# Patient Record
Sex: Male | Born: 1950 | Race: White | Hispanic: No | Marital: Married | Smoking: Never smoker
Health system: Southern US, Community
[De-identification: ages and names within clinical notes are randomized; demographics above are authoritative.]

## PROBLEM LIST (undated history)

## (undated) DIAGNOSIS — I499 Cardiac arrhythmia, unspecified: Secondary | ICD-10-CM

## (undated) DIAGNOSIS — Z6828 Body mass index (BMI) 28.0-28.9, adult: Secondary | ICD-10-CM

## (undated) DIAGNOSIS — R55 Syncope and collapse: Secondary | ICD-10-CM

## (undated) DIAGNOSIS — R079 Chest pain, unspecified: Secondary | ICD-10-CM

## (undated) DIAGNOSIS — I2510 Atherosclerotic heart disease of native coronary artery without angina pectoris: Secondary | ICD-10-CM

## (undated) DIAGNOSIS — K219 Gastro-esophageal reflux disease without esophagitis: Secondary | ICD-10-CM

## (undated) DIAGNOSIS — E785 Hyperlipidemia, unspecified: Secondary | ICD-10-CM

## (undated) DIAGNOSIS — IMO0002 Reserved for concepts with insufficient information to code with codable children: Secondary | ICD-10-CM

## (undated) DIAGNOSIS — D721 Eosinophilia, unspecified: Secondary | ICD-10-CM

## (undated) DIAGNOSIS — I480 Paroxysmal atrial fibrillation: Secondary | ICD-10-CM

## (undated) DIAGNOSIS — M1990 Unspecified osteoarthritis, unspecified site: Secondary | ICD-10-CM

## (undated) DIAGNOSIS — I779 Disorder of arteries and arterioles, unspecified: Secondary | ICD-10-CM

## (undated) DIAGNOSIS — I10 Essential (primary) hypertension: Secondary | ICD-10-CM

## (undated) DIAGNOSIS — L989 Disorder of the skin and subcutaneous tissue, unspecified: Secondary | ICD-10-CM

## (undated) HISTORY — DX: Chest pain, unspecified: R07.9

## (undated) HISTORY — PX: UPPER GI ENDOSCOPY: SHX6162

## (undated) HISTORY — DX: Paroxysmal atrial fibrillation: I48.0

## (undated) HISTORY — DX: Essential (primary) hypertension: I10

## (undated) HISTORY — DX: Syncope and collapse: R55

## (undated) HISTORY — DX: Body mass index (BMI) 28.0-28.9, adult: Z68.28

## (undated) HISTORY — DX: Disorder of the skin and subcutaneous tissue, unspecified: L98.9

## (undated) HISTORY — DX: Gastro-esophageal reflux disease without esophagitis: K21.9

## (undated) HISTORY — DX: Disorder of arteries and arterioles, unspecified: I77.9

## (undated) HISTORY — DX: Unspecified osteoarthritis, unspecified site: M19.90

## (undated) HISTORY — DX: Reserved for concepts with insufficient information to code with codable children: IMO0002

## (undated) HISTORY — DX: Hyperlipidemia, unspecified: E78.5

## (undated) HISTORY — DX: Eosinophilia, unspecified: D72.10

## (undated) HISTORY — PX: COLONOSCOPY: SHX174

---
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
## 1994-06-23 HISTORY — PX: COLOSTOMY: SHX63

## 1995-06-24 HISTORY — PX: COLOSTOMY REVERSAL: SHX5782

## 2003-04-15 LAB — HM COLONOSCOPY

## 2004-09-03 ENCOUNTER — Ambulatory Visit: Payer: Self-pay | Admitting: Gastroenterology

## 2009-04-17 ENCOUNTER — Observation Stay: Payer: Self-pay | Admitting: Internal Medicine

## 2009-04-20 ENCOUNTER — Ambulatory Visit: Payer: Self-pay | Admitting: Internal Medicine

## 2013-04-13 ENCOUNTER — Ambulatory Visit: Payer: Self-pay | Admitting: Internal Medicine

## 2013-04-14 ENCOUNTER — Ambulatory Visit (INDEPENDENT_AMBULATORY_CARE_PROVIDER_SITE_OTHER): Payer: 59 | Admitting: Internal Medicine

## 2013-04-14 ENCOUNTER — Encounter: Payer: Self-pay | Admitting: Internal Medicine

## 2013-04-14 VITALS — BP 148/84 | HR 82 | Temp 98.3°F | Ht 67.25 in | Wt 176.0 lb

## 2013-04-14 DIAGNOSIS — Z0000 Encounter for general adult medical examination without abnormal findings: Secondary | ICD-10-CM

## 2013-04-14 DIAGNOSIS — R030 Elevated blood-pressure reading, without diagnosis of hypertension: Secondary | ICD-10-CM

## 2013-04-14 DIAGNOSIS — IMO0001 Reserved for inherently not codable concepts without codable children: Secondary | ICD-10-CM

## 2013-04-14 DIAGNOSIS — E785 Hyperlipidemia, unspecified: Secondary | ICD-10-CM

## 2013-04-14 NOTE — Assessment & Plan Note (Signed)
Will check lipids and LFTs with labs today. Encouraged continued healthy diet and regular physical activity.

## 2013-04-14 NOTE — Progress Notes (Signed)
  Subjective:    Patient ID: Dalton Sanders, male    DOB: 1950-07-22, 62 y.o.   MRN: [id number]  HPI 62 year old male with history of hyperlipidemia and hypertension presents to establish care. He reports he is generally feeling well. In the past, he had been taking simvastatin. He has not taken this medication in several months. He has been taking red yeast rice and fish oil. He tries to follow a healthy diet low in saturated fat and processed sugars. He is physically active. He denies any concerns today.  No outpatient encounter prescriptions on file as of 04/14/2013.   No facility-administered encounter medications on file as of 04/14/2013.   BP 148/84  Pulse 82  Temp(Src) 98.3 F (36.8 C) (Oral)  Ht 5' 7.25" (1.708 m)  Wt 176 lb (79.833 kg)  BMI 27.37 kg/m2  SpO2 97%  Review of Systems  Constitutional: Negative for fever, chills, activity change, appetite change, fatigue and unexpected weight change.  Eyes: Negative for visual disturbance.  Respiratory: Negative for cough and shortness of breath.   Cardiovascular: Negative for chest pain, palpitations and leg swelling.  Gastrointestinal: Negative for abdominal pain and abdominal distention.  Genitourinary: Negative for dysuria, urgency and difficulty urinating.  Musculoskeletal: Negative for arthralgias and gait problem.  Skin: Negative for color change and rash.  Hematological: Negative for adenopathy.  Psychiatric/Behavioral: Negative for sleep disturbance and dysphoric mood. The patient is not nervous/anxious.        Objective:   Physical Exam  Constitutional: He is oriented to person, place, and time. He appears well-developed and well-nourished. No distress.  HENT:  Head: Normocephalic and atraumatic.  Right Ear: External ear normal.  Left Ear: External ear normal.  Nose: Nose normal.  Mouth/Throat: Oropharynx is clear and moist. No oropharyngeal exudate.  Eyes: Conjunctivae and EOM are normal. Pupils are equal,  round, and reactive to light. Right eye exhibits no discharge. Left eye exhibits no discharge. No scleral icterus.  Neck: Normal range of motion. Neck supple. No tracheal deviation present. No thyromegaly present.  Cardiovascular: Normal rate, regular rhythm and normal heart sounds.  Exam reveals no gallop and no friction rub.   No murmur heard. Pulmonary/Chest: Effort normal and breath sounds normal. No respiratory distress. He has no wheezes. He has no rales. He exhibits no tenderness.  Abdominal: Soft. Bowel sounds are normal. He exhibits no distension. There is no tenderness. There is no rebound and no guarding.  Musculoskeletal: Normal range of motion. He exhibits no edema.  Lymphadenopathy:    He has no cervical adenopathy.  Neurological: He is alert and oriented to person, place, and time. No cranial nerve deficit. Coordination normal.  Skin: Skin is warm and dry. No rash noted. He is not diaphoretic. No erythema. No pallor.  Psychiatric: He has a normal mood and affect. His behavior is normal. Judgment and thought content normal.          Assessment & Plan:

## 2013-04-14 NOTE — Assessment & Plan Note (Signed)
BP Readings from Last 3 Encounters:  04/14/13 148/84   Pt notes mild elevation of BP in the past. Never on meds for this. Will check renal function with labs. Will monitor BP.

## 2013-04-15 ENCOUNTER — Other Ambulatory Visit (INDEPENDENT_AMBULATORY_CARE_PROVIDER_SITE_OTHER): Payer: 59

## 2013-04-15 DIAGNOSIS — Z0000 Encounter for general adult medical examination without abnormal findings: Secondary | ICD-10-CM

## 2013-04-15 DIAGNOSIS — R030 Elevated blood-pressure reading, without diagnosis of hypertension: Secondary | ICD-10-CM

## 2013-04-15 DIAGNOSIS — IMO0001 Reserved for inherently not codable concepts without codable children: Secondary | ICD-10-CM

## 2013-04-15 DIAGNOSIS — E785 Hyperlipidemia, unspecified: Secondary | ICD-10-CM

## 2013-04-15 LAB — CBC WITH DIFFERENTIAL/PLATELET
Basophils Absolute: 0 10*3/uL (ref 0.0–0.1)
Basophils Relative: 0.6 % (ref 0.0–3.0)
Eosinophils Relative: 9.9 % — ABNORMAL HIGH (ref 0.0–5.0)
HCT: 44.3 % (ref 39.0–52.0)
Lymphocytes Relative: 37.1 % (ref 12.0–46.0)
Lymphs Abs: 2.9 10*3/uL (ref 0.7–4.0)
MCHC: 34.1 g/dL (ref 30.0–36.0)
MCV: 94.5 fl (ref 78.0–100.0)
Monocytes Relative: 7 % (ref 3.0–12.0)
Neutrophils Relative %: 45.4 % (ref 43.0–77.0)
Platelets: 246 10*3/uL (ref 150.0–400.0)
RBC: 4.69 Mil/uL (ref 4.22–5.81)
WBC: 7.8 10*3/uL (ref 4.5–10.5)

## 2013-04-15 LAB — LIPID PANEL
HDL: 49.6 mg/dL (ref >39.00)
Total CHOL/HDL Ratio: 5
Triglycerides: 150 mg/dL — ABNORMAL HIGH (ref 0.0–149.0)
VLDL: 30 mg/dL (ref 0.0–40.0)

## 2013-04-15 LAB — COMPREHENSIVE METABOLIC PANEL
ALT: 21 U/L (ref 0–53)
AST: 22 U/L (ref 0–37)
Albumin: 3.9 g/dL (ref 3.5–5.2)
Alkaline Phosphatase: 58 U/L (ref 39–117)
BUN: 13 mg/dL (ref 6–23)
Glucose, Bld: 104 mg/dL — ABNORMAL HIGH (ref 70–99)
Potassium: 4.2 mEq/L (ref 3.5–5.1)

## 2013-04-15 LAB — TSH: TSH: 1.25 u[IU]/mL (ref 0.35–5.50)

## 2013-04-15 LAB — MICROALBUMIN / CREATININE URINE RATIO
Creatinine,U: 284.5 mg/dL
Microalb Creat Ratio: 0.5 mg/g (ref 0.0–30.0)
Microalb, Ur: 1.3 mg/dL (ref 0.0–1.9)

## 2013-04-15 LAB — LDL CHOLESTEROL, DIRECT: Direct LDL: 200.1 mg/dL

## 2013-04-16 LAB — PSA, TOTAL AND FREE
PSA, Free Pct: 22 % — ABNORMAL LOW (ref >25)
PSA, Free: 0.53 ng/mL
PSA: 2.38 ng/mL (ref <=4.00)

## 2013-04-16 LAB — VITAMIN D 25 HYDROXY (VIT D DEFICIENCY, FRACTURES): Vit D, 25-Hydroxy: 70 ng/mL (ref 30–89)

## 2013-04-18 ENCOUNTER — Telehealth: Payer: Self-pay | Admitting: *Deleted

## 2013-04-18 MED ORDER — ATORVASTATIN CALCIUM 20 MG PO TABS: 20.0000 mg | ORAL_TABLET | Freq: Every day | ORAL | Status: DC

## 2013-04-18 NOTE — Telephone Encounter (Signed)
Rx sent to the pharmacy.

## 2013-05-02 ENCOUNTER — Encounter: Payer: Self-pay | Admitting: Internal Medicine

## 2013-05-05 ENCOUNTER — Ambulatory Visit: Payer: 59 | Admitting: Internal Medicine

## 2013-05-13 ENCOUNTER — Encounter: Payer: Self-pay | Admitting: Internal Medicine

## 2013-06-08 ENCOUNTER — Telehealth: Payer: Self-pay | Admitting: Internal Medicine

## 2013-06-08 NOTE — Telephone Encounter (Signed)
The patient would like a call back from the Dr. When she comes back to the office.

## 2013-06-08 NOTE — Telephone Encounter (Signed)
Can we try to work him in at end of day on Thursday?

## 2013-06-08 NOTE — Telephone Encounter (Signed)
Patient Information:  Caller Name: Malick  Phone: [phone]  Patient: Dalton Sanders, Dalton Sanders  Gender: Male  DOB: 1950-12-09  Age: 62 Years  PCP: Ronna Polio (Adults only)  Office Follow Up:  Does the office need to follow up with this patient?: No  Instructions For The Office: N/A  RN Note:  Stressful at work. Not on BP meds. Used Mucinex D recently.  Checks BP at least TID. Felt flushed after taking higher dose of Niacin. No appointments for 12/17 or 06/09/13.  Transferred to [employer] office scheduler for future appointment.  Symptoms  Reason For Call & Symptoms: BP 170/84 both arms at 1200;  180/87 and  now 140/77 at 1430.  Reviewed Health History In EMR: Yes  Reviewed Medications In EMR: Yes  Reviewed Allergies In EMR: Yes  Reviewed Surgeries / Procedures: Yes  Date of Onset of Symptoms: 06/08/2013  Treatments Tried: took 2 baby ASA sub lig  Treatments Tried Worked: Yes  Guideline(s) Used:  High Blood Pressure  Disposition Per Guideline:   See Within 2 Weeks in Office  Reason For Disposition Reached:   BP > 160/100  Advice Given:  General:  Untreated high blood pressure may cause damage to the heart, brain, kidneys, and eyes.  Treatment of high blood pressure can reduce the risk of stroke, heart attack, and heart failure.  The goal of blood pressure treatment for most patients with hypertension is to keep the blood pressure under 140/90.  Call Back If:  Headache, blurred vision, difficulty talking, or difficulty walking occurs  Chest pain or difficulty breathing occurs  You want to go in to the office for a blood pressure check  You become worse.  Patient Will Follow Care Advice:  YES

## 2013-06-09 ENCOUNTER — Ambulatory Visit (INDEPENDENT_AMBULATORY_CARE_PROVIDER_SITE_OTHER): Payer: 59 | Admitting: Internal Medicine

## 2013-06-09 ENCOUNTER — Encounter (INDEPENDENT_AMBULATORY_CARE_PROVIDER_SITE_OTHER): Payer: Self-pay

## 2013-06-09 VITALS — BP 150/92 | HR 80 | Temp 98.0°F | Wt 180.0 lb

## 2013-06-09 DIAGNOSIS — R079 Chest pain, unspecified: Secondary | ICD-10-CM

## 2013-06-09 DIAGNOSIS — E785 Hyperlipidemia, unspecified: Secondary | ICD-10-CM

## 2013-06-09 DIAGNOSIS — R030 Elevated blood-pressure reading, without diagnosis of hypertension: Secondary | ICD-10-CM

## 2013-06-09 LAB — TROPONIN I: Troponin I: 0.02 ng/mL (ref <0.06)

## 2013-06-09 NOTE — Telephone Encounter (Signed)
Pt notified. Will be seen today at 3:30

## 2013-06-09 NOTE — Progress Notes (Signed)
Pre-visit discussion using our clinic review tool. No additional management support is needed unless otherwise documented below in the visit note.  

## 2013-06-10 ENCOUNTER — Encounter: Payer: Self-pay | Admitting: Internal Medicine

## 2013-06-10 DIAGNOSIS — R079 Chest pain, unspecified: Secondary | ICD-10-CM | POA: Insufficient documentation

## 2013-06-10 LAB — COMPREHENSIVE METABOLIC PANEL
ALT: 31 U/L (ref 0–53)
AST: 27 U/L (ref 0–37)
Albumin: 4.4 g/dL (ref 3.5–5.2)
BUN: 18 mg/dL (ref 6–23)
CO2: 30 mEq/L (ref 19–32)
Calcium: 9.3 mg/dL (ref 8.4–10.5)
Chloride: 103 mEq/L (ref 96–112)
Potassium: 4.3 mEq/L (ref 3.5–5.1)

## 2013-06-10 LAB — LDL CHOLESTEROL, DIRECT: Direct LDL: 97.2 mg/dL

## 2013-06-10 LAB — LIPID PANEL
Cholesterol: 178 mg/dL (ref 0–200)
HDL: 60.9 mg/dL (ref >39.00)

## 2013-06-10 NOTE — Progress Notes (Signed)
Subjective:    Patient ID: Dalton Sanders, male    DOB: [birth year], 62 y.o.   MRN: [id number]  HPI 62 year old male with history of hyperlipidemia presents for acute visit. Over the last few weeks, he reports that his blood pressure has been more elevated. This is particularly true when he is feeling anxious. During those times, blood pressure can be as high as 160-180/100. At baseline, his blood pressure is typically near 130/70 or less. He monitors his blood pressure several times daily. Over the last few days, he has noted increased fatigue. He also notes intermittent symptoms of epigastric burning or lower chest discomfort. One of his friends was recently diagnosed with coronary artery disease at age 27. He reports that this has increased his anxiety about having coronary artery disease himself. He is physically very active. He tries to follow a healthy diet. He was recently started on atorvastatin to help control lipids.  Outpatient Encounter Prescriptions as of 06/09/2013  Medication Sig  . aspirin 81 MG tablet Take 81 mg by mouth daily.  Marland Kitchen atorvastatin (LIPITOR) 20 MG tablet Take 1 tablet (20 mg total) by mouth daily.  . Cetirizine HCl 10 MG CAPS Take by mouth.  . Cholecalciferol (VITAMIN D3) 2000 UNITS TABS Take by mouth.  . Coenzyme Q10 200 MG capsule Take 200 mg by mouth daily.  Boris Lown Oil 300 MG CAPS Take by mouth.  Marland Kitchen L-Arginine 1000 MG TABS Take by mouth.  . Multiple Vitamin (ONE-A-DAY MENS PO) Take by mouth.  . niacin 500 MG tablet Take 500 mg by mouth at bedtime.  Marland Kitchen omeprazole (PRILOSEC) 20 MG capsule Take 20 mg by mouth daily.  . Red Yeast Rice Extract (RED YEAST RICE PO) Take 1,200 mg by mouth.  . Saw Palmetto, Serenoa repens, (SAW PALMETTO PO) Take 40 mg by mouth.  . vitamin C (ASCORBIC ACID) 500 MG tablet Take 500 mg by mouth daily.   BP 150/92  Pulse 80  Temp(Src) 98 F (36.7 C) (Oral)  Wt 180 lb (81.647 kg)  SpO2 97%  Review of Systems  Constitutional:  Positive for fatigue. Negative for fever, chills, activity change, appetite change and unexpected weight change.  Eyes: Negative for visual disturbance.  Respiratory: Negative for cough and shortness of breath.   Cardiovascular: Positive for chest pain (upper epigastric, lower chest). Negative for palpitations and leg swelling.  Gastrointestinal: Negative for abdominal pain and abdominal distention.  Genitourinary: Negative for dysuria, urgency and difficulty urinating.  Musculoskeletal: Negative for arthralgias and gait problem.  Skin: Negative for color change and rash.  Hematological: Negative for adenopathy.  Psychiatric/Behavioral: Negative for sleep disturbance and dysphoric mood. The patient is not nervous/anxious.        Objective:   Physical Exam  Constitutional: He is oriented to person, place, and time. He appears well-developed and well-nourished. No distress.  HENT:  Head: Normocephalic and atraumatic.  Right Ear: External ear normal.  Left Ear: External ear normal.  Nose: Nose normal.  Mouth/Throat: Oropharynx is clear and moist. No oropharyngeal exudate.  Eyes: Conjunctivae and EOM are normal. Pupils are equal, round, and reactive to light. Right eye exhibits no discharge. Left eye exhibits no discharge. No scleral icterus.  Neck: Normal range of motion. Neck supple. No tracheal deviation present. No thyromegaly present.  Cardiovascular: Normal rate, regular rhythm and normal heart sounds.  Exam reveals no gallop and no friction rub.   No murmur heard. Pulmonary/Chest: Effort normal and breath sounds normal. No respiratory distress. He  has no wheezes. He has no rales. He exhibits no tenderness.  Musculoskeletal: Normal range of motion. He exhibits no edema.  Lymphadenopathy:    He has no cervical adenopathy.  Neurological: He is alert and oriented to person, place, and time. No cranial nerve deficit. Coordination normal.  Skin: Skin is warm and dry. No rash noted. He is  not diaphoretic. No erythema. No pallor.  Psychiatric: He has a normal mood and affect. His behavior is normal. Judgment and thought content normal.          Assessment & Plan:

## 2013-06-10 NOTE — Assessment & Plan Note (Signed)
Blood pressure has been intermittently elevated in setting of increased anxiety. However, he also has some low blood pressure readings at home. Will continue to monitor for now. Consider adding antihypertensive agent if BP consistently >140/90. Follow up 4 weeks for recheck.

## 2013-06-10 NOTE — Assessment & Plan Note (Signed)
Lipids are much improved on atorvastatin. Will continue.

## 2013-06-10 NOTE — Assessment & Plan Note (Signed)
Patient with hyperlipidemia with recent episodes of epigastric or lower chest discomfort and increased fatigue. Question if this may be secondary to coronary artery disease. EKG normal today. Will set up cardiology evaluation for possible stress test. Continue atorvastatin.

## 2013-06-14 ENCOUNTER — Encounter: Payer: Self-pay | Admitting: Cardiovascular Disease

## 2013-06-14 ENCOUNTER — Encounter (INDEPENDENT_AMBULATORY_CARE_PROVIDER_SITE_OTHER): Payer: Self-pay

## 2013-06-14 ENCOUNTER — Encounter: Payer: Self-pay | Admitting: *Deleted

## 2013-06-14 ENCOUNTER — Ambulatory Visit (INDEPENDENT_AMBULATORY_CARE_PROVIDER_SITE_OTHER): Payer: 59 | Admitting: Cardiovascular Disease

## 2013-06-14 VITALS — BP 152/101 | HR 82 | Ht 68.0 in | Wt 175.0 lb

## 2013-06-14 VITALS — BP 158/101 | HR 99 | Ht 68.0 in | Wt 175.8 lb

## 2013-06-14 DIAGNOSIS — R079 Chest pain, unspecified: Secondary | ICD-10-CM

## 2013-06-14 DIAGNOSIS — R030 Elevated blood-pressure reading, without diagnosis of hypertension: Secondary | ICD-10-CM

## 2013-06-14 DIAGNOSIS — E785 Hyperlipidemia, unspecified: Secondary | ICD-10-CM

## 2013-06-14 DIAGNOSIS — IMO0001 Reserved for inherently not codable concepts without codable children: Secondary | ICD-10-CM
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
## 2013-06-14 NOTE — Assessment & Plan Note (Signed)
He is currently being treated with atorvastatin, fish oil and red yeast rice.

## 2013-06-14 NOTE — Patient Instructions (Signed)
Your physician has requested that you have an exercise tolerance test. For further information please visit [email or web address]. Please also follow instruction sheet, as given.   

## 2013-06-14 NOTE — Procedures (Signed)
   Treadmill Stress test  Indication: Atypical chest pain.  Baseline Data:  Resting EKG shows NSR with rate of 78 beats per minute. bpm, no significant ST or T wave changes. Resting blood pressure of 152/100 mm Hg Stand bruce protocal was used.  Exercise Data:  Patient exercised for 6 min 7 sec,  Peak heart rate of 145 bpm.  This was 90 % of the maximum predicted heart rate. No symptoms of chest pain or lightheadedness were reported at peak stress or in recovery.  Peak Blood pressure recorded was 210/108 Maximal work level: 7 METs.  Heart rate at 3 minutes in recovery was 112 bpm. BP response: Hypertensive HR response: Normal  EKG with Exercise: Sinus tachycardia with no significant ST changes  FINAL IMPRESSION: Normal exercise stress test. No significant EKG changes concerning for ischemia. Average exercise tolerance with hypertensive response to exercise.  Recommendation: Continue aggressive treatment of risk factors.

## 2013-06-14 NOTE — Assessment & Plan Note (Signed)
He has been taking his blood pressure at home. The readings are normal. Continue to monitor.

## 2013-06-14 NOTE — Patient Instructions (Signed)
Your stress test is normal.  Follow up as needed.  

## 2013-06-14 NOTE — Assessment & Plan Note (Signed)
Chest pain is overall atypical. Cardiac exam is unremarkable and baseline EKG is normal. I proceeded with a treadmill stress test. He was able to exercise for 6 minutes and 7 seconds. He did not have any chest discomfort and there were no ischemic EKG changes. He did have a hypertensive response to exercise. No further cardiac workup is recommended. I discussed with the patient the importance of lifestyle changes in order to decrease the chance of future coronary artery disease and cardiovascular events. We discussed the importance of controlling risk factors, healthy diet as well as regular exercise. I also explained to him that a normal stress test does not rule out atherosclerosis.

## 2013-06-15 ENCOUNTER — Encounter: Payer: Self-pay | Admitting: *Deleted

## 2013-06-24 ENCOUNTER — Encounter: Payer: Self-pay | Admitting: Internal Medicine

## 2013-06-24 ENCOUNTER — Ambulatory Visit (INDEPENDENT_AMBULATORY_CARE_PROVIDER_SITE_OTHER): Payer: 59 | Admitting: Internal Medicine

## 2013-06-24 VITALS — BP 132/88 | HR 70 | Temp 97.6°F | Wt 180.0 lb

## 2013-06-24 DIAGNOSIS — IMO0001 Reserved for inherently not codable concepts without codable children: Secondary | ICD-10-CM

## 2013-06-24 DIAGNOSIS — E785 Hyperlipidemia, unspecified: Secondary | ICD-10-CM

## 2013-06-24 DIAGNOSIS — R079 Chest pain, unspecified: Secondary | ICD-10-CM

## 2013-06-24 DIAGNOSIS — R030 Elevated blood-pressure reading, without diagnosis of hypertension: Secondary | ICD-10-CM

## 2013-06-24 NOTE — Assessment & Plan Note (Signed)
Lipids markedly improved on atorvastatin. Will continue.

## 2013-06-24 NOTE — Progress Notes (Signed)
Subjective:    Patient ID: [name], male    DOB: [birth year], 63 y.o.   MRN: [id number]  HPI 63 year old male with history of hyperlipidemia and occasional elevation of blood pressure presents for followup. He was recently seen by cardiology for chest pain. He underwent exercise stress test which was normal. He reports that chest pain, described as tingling in his anterior substernal area has resolved. He is generally been feeling well. He denies palpitations or shortness of breath. He has been checking his blood pressure at home and is mostly between 120 to 130s over 70s to 80s.  In regards to hyperlipidemia, we recently started him on atorvastatin. LDL cholesterol initially was 200. Repeat lipids performed this month showed improvement in LDL to 97. He also follows a healthy diet and takes Krill oil. No side effects noted from the Atorvastatin.  Outpatient Encounter Prescriptions as of 06/24/2013  Medication Sig  . aspirin 81 MG tablet Take 81 mg by mouth 2 (two) times daily.   Marland Kitchen atorvastatin (LIPITOR) 20 MG tablet Take 1 tablet (20 mg total) by mouth daily.  . Cetirizine HCl 10 MG CAPS Take by mouth daily.   . Cholecalciferol (VITAMIN D3) 2000 UNITS TABS Take by mouth daily.   . Coenzyme Q10 200 MG capsule Take 200 mg by mouth daily.  Javier Docker Oil 300 MG CAPS Take by mouth daily.   Marland Kitchen L-Arginine 1000 MG TABS Take by mouth daily.   . Multiple Vitamin (ONE-A-DAY MENS PO) Take by mouth daily.   . niacin 500 MG tablet Take 500 mg by mouth at bedtime.  Marland Kitchen omeprazole (PRILOSEC) 20 MG capsule Take 20 mg by mouth daily.  . Red Yeast Rice Extract (RED YEAST RICE PO) Take 1,200 mg by mouth.  . Saw Palmetto, Serenoa repens, (SAW PALMETTO PO) Take 40 mg by mouth.  . vitamin C (ASCORBIC ACID) 500 MG tablet Take 500 mg by mouth daily.   BP 132/88  Pulse 70  Temp(Src) 97.6 F (36.4 C) (Oral)  Wt 180 lb (81.647 kg)  SpO2 97%  Review of Systems  Constitutional: Negative for fever, chills,  activity change, appetite change, fatigue and unexpected weight change.  Eyes: Negative for visual disturbance.  Respiratory: Negative for cough and shortness of breath.   Cardiovascular: Negative for chest pain, palpitations and leg swelling.  Gastrointestinal: Negative for abdominal pain and abdominal distention.  Genitourinary: Negative for dysuria, urgency and difficulty urinating.  Musculoskeletal: Negative for arthralgias and gait problem.  Skin: Negative for color change and rash.  Hematological: Negative for adenopathy.  Psychiatric/Behavioral: Negative for sleep disturbance and dysphoric mood. The patient is not nervous/anxious.        Objective:   Physical Exam  Constitutional: He is oriented to person, place, and time. He appears well-developed and well-nourished. No distress.  HENT:  Head: Normocephalic and atraumatic.  Right Ear: External ear normal.  Left Ear: External ear normal.  Nose: Nose normal.  Mouth/Throat: Oropharynx is clear and moist. No oropharyngeal exudate.  Eyes: Conjunctivae and EOM are normal. Pupils are equal, round, and reactive to light. Right eye exhibits no discharge. Left eye exhibits no discharge. No scleral icterus.  Neck: Normal range of motion. Neck supple. No tracheal deviation present. No thyromegaly present.  Cardiovascular: Normal rate, regular rhythm and normal heart sounds.  Exam reveals no gallop and no friction rub.   No murmur heard. Pulmonary/Chest: Effort normal and breath sounds normal. No respiratory distress. He has no wheezes. He has no  rales. He exhibits no tenderness.  Musculoskeletal: Normal range of motion. He exhibits no edema.  Lymphadenopathy:    He has no cervical adenopathy.  Neurological: He is alert and oriented to person, place, and time. No cranial nerve deficit. Coordination normal.  Skin: Skin is warm and dry. No rash noted. He is not diaphoretic. No erythema. No pallor.  Psychiatric: He has a normal mood and  affect. His behavior is normal. Judgment and thought content normal.          Assessment & Plan:

## 2013-06-24 NOTE — Assessment & Plan Note (Signed)
Symptoms have resolved. Exercise stress test was normal. Pain was likely non-cardiac. He will call if any recurrent symptoms.

## 2013-06-24 NOTE — Progress Notes (Signed)
Pre-visit discussion using our clinic review tool. No additional management support is needed unless otherwise documented below in the visit note.  

## 2013-06-24 NOTE — Assessment & Plan Note (Signed)
BP Readings from Last 3 Encounters:  06/24/13 132/88  06/14/13 152/101  06/14/13 158/101   BP improved in clinic today. Well controlled at home. Encouraged him to continue to monitor BP and call if consistently >140/90

## 2013-10-22 ENCOUNTER — Other Ambulatory Visit: Payer: Self-pay | Admitting: Internal Medicine

## 2013-11-01 ENCOUNTER — Other Ambulatory Visit: Payer: Self-pay | Admitting: Internal Medicine

## 2013-12-29 ENCOUNTER — Encounter: Payer: 59 | Admitting: Internal Medicine

## 2014-01-17 ENCOUNTER — Telehealth: Payer: Self-pay | Admitting: *Deleted

## 2014-01-17 ENCOUNTER — Emergency Department (HOSPITAL_COMMUNITY)
Admission: EM | Admit: 2014-01-17 | Discharge: 2014-01-18 | Disposition: A | Discharge status: Home / Self Care | Payer: 59 | Attending: Emergency Medicine | Admitting: Emergency Medicine

## 2014-01-17 ENCOUNTER — Emergency Department (HOSPITAL_COMMUNITY): Payer: 59

## 2014-01-17 ENCOUNTER — Encounter (HOSPITAL_COMMUNITY): Payer: Self-pay | Admitting: Emergency Medicine

## 2014-01-17 DIAGNOSIS — R002 Palpitations: Secondary | ICD-10-CM | POA: Diagnosis not present

## 2014-01-17 DIAGNOSIS — Z7982 Long term (current) use of aspirin: Secondary | ICD-10-CM | POA: Insufficient documentation

## 2014-01-17 DIAGNOSIS — M1710 Unilateral primary osteoarthritis, unspecified knee: Secondary | ICD-10-CM | POA: Diagnosis not present

## 2014-01-17 DIAGNOSIS — E785 Hyperlipidemia, unspecified: Secondary | ICD-10-CM | POA: Insufficient documentation

## 2014-01-17 DIAGNOSIS — I10 Essential (primary) hypertension: Secondary | ICD-10-CM | POA: Diagnosis not present

## 2014-01-17 DIAGNOSIS — Z79899 Other long term (current) drug therapy: Secondary | ICD-10-CM | POA: Insufficient documentation

## 2014-01-17 DIAGNOSIS — IMO0002 Reserved for concepts with insufficient information to code with codable children: Secondary | ICD-10-CM

## 2014-01-17 DIAGNOSIS — R000 Tachycardia, unspecified: Secondary | ICD-10-CM | POA: Insufficient documentation

## 2014-01-17 LAB — URINALYSIS, ROUTINE W REFLEX MICROSCOPIC
Bilirubin Urine: NEGATIVE
Glucose, UA: NEGATIVE mg/dL
KETONES UR: NEGATIVE mg/dL
Leukocytes, UA: NEGATIVE
NITRITE: NEGATIVE
PH: 8 (ref 5.0–8.0)
Protein, ur: NEGATIVE mg/dL
Specific Gravity, Urine: 1.012 (ref 1.005–1.030)
UROBILINOGEN UA: 1 mg/dL (ref 0.0–1.0)

## 2014-01-17 LAB — CBC
HCT: 42.9 % (ref 39.0–52.0)
Hemoglobin: 14.8 g/dL (ref 13.0–17.0)
MCH: 32.2 pg (ref 26.0–34.0)
MCHC: 34.5 g/dL (ref 30.0–36.0)
MCV: 93.3 fL (ref 78.0–100.0)
Platelets: 247 10*3/uL (ref 150–400)
RBC: 4.6 MIL/uL (ref 4.22–5.81)
RDW: 13.3 % (ref 11.5–15.5)
WBC: 10.1 10*3/uL (ref 4.0–10.5)

## 2014-01-17 LAB — BASIC METABOLIC PANEL
Anion gap: 14 (ref 5–15)
BUN: 15 mg/dL (ref 6–23)
CALCIUM: 9.1 mg/dL (ref 8.4–10.5)
CHLORIDE: 102 meq/L (ref 96–112)
CO2: 26 mEq/L (ref 19–32)
CREATININE: 1.03 mg/dL (ref 0.50–1.35)
GFR calc non Af Amer: 76 mL/min — ABNORMAL LOW (ref >90)
GFR, EST AFRICAN AMERICAN: 88 mL/min — AB (ref >90)
Glucose, Bld: 108 mg/dL — ABNORMAL HIGH (ref 70–99)
Potassium: 3.9 mEq/L (ref 3.7–5.3)
Sodium: 142 mEq/L (ref 137–147)

## 2014-01-17 LAB — URINE MICROSCOPIC-ADD ON

## 2014-01-17 LAB — TROPONIN I

## 2014-01-17 NOTE — ED Notes (Signed)
Pt in xray

## 2014-01-17 NOTE — ED Notes (Signed)
Patient presents stating he noticed his heart was racing.  Stated he was walking out in the heat not drinking.  States he has not been having pain in his chest but has discomfort and a squeezing process
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
## 2014-01-17 NOTE — Telephone Encounter (Signed)
Patient called having bp issues.  Out in hot weather on Sunday 157/141 r 147  And out riding motorcycles this morning and bp 152/119 r 132. He is feeling funny and flutters chest.

## 2014-01-17 NOTE — Telephone Encounter (Signed)
Spoke w/ pt.  He states that he checks his BP about 2-3 times an hour and experiences quite a bit of anxiety.  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 States that his BP has been running 150/130s w/ HR in the 130-160 range.  Denies any pain, but reports a sensation of "butterflies in my chest". Advised pt that he does not have any BP meds to adjust, so he will need an appt. He states that he would like a med that he does not have to take all the time, only prn. He is sched to see Dalton Bayley, NP tomorrow at 1:15.

## 2014-01-18 ENCOUNTER — Ambulatory Visit (INDEPENDENT_AMBULATORY_CARE_PROVIDER_SITE_OTHER): Payer: 59 | Admitting: Nurse Practitioner

## 2014-01-18 ENCOUNTER — Other Ambulatory Visit: Payer: Self-pay | Admitting: Nurse Practitioner

## 2014-01-18 ENCOUNTER — Encounter: Payer: Self-pay | Admitting: Nurse Practitioner

## 2014-01-18 ENCOUNTER — Telehealth: Payer: Self-pay | Admitting: *Deleted

## 2014-01-18 VITALS — BP 140/92 | HR 79 | Ht 69.0 in | Wt 175.5 lb

## 2014-01-18 DIAGNOSIS — R0789 Other chest pain: Secondary | ICD-10-CM

## 2014-01-18 DIAGNOSIS — I479 Paroxysmal tachycardia, unspecified: Secondary | ICD-10-CM

## 2014-01-18 DIAGNOSIS — I480 Paroxysmal atrial fibrillation: Secondary | ICD-10-CM

## 2014-01-18 DIAGNOSIS — I471 Supraventricular tachycardia: Secondary | ICD-10-CM | POA: Insufficient documentation

## 2014-01-18 DIAGNOSIS — R000 Tachycardia, unspecified: Secondary | ICD-10-CM

## 2014-01-18 DIAGNOSIS — R072 Precordial pain: Secondary | ICD-10-CM

## 2014-01-18 DIAGNOSIS — I498 Other specified cardiac arrhythmias: Secondary | ICD-10-CM

## 2014-01-18 LAB — I-STAT TROPONIN, ED: Troponin i, poc: 0 ng/mL (ref 0.00–0.08)

## 2014-01-18 MED ORDER — DILTIAZEM HCL 30 MG PO TABS: 30.0000 mg | ORAL_TABLET | Freq: Four times a day (QID) | ORAL | Status: DC | PRN
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
## 2014-01-18 NOTE — Telephone Encounter (Signed)
error 

## 2014-01-18 NOTE — Telephone Encounter (Signed)
Patient stated he had started to feel worse yesterday afternoon  He stated that his blood pressure felt elevated and his heart was racing  He went to the fire dept for an EKG and to the ED at [facility]  He wanted to know if he should keep his follow up with Christell Faith PA today  I told him to keep the appt  He verbalized understanding

## 2014-01-18 NOTE — Patient Instructions (Addendum)
We will draw labs today:  TSH, Mg  Please start diltiazem 30mg  every 6 hrs as needed for heart rate over 120 bpm or palpitations  We will set you up with [employer] for a 30 day monitor.  They should be calling you this afternoon.  Your physician has requested that you have an echocardiogram. Echocardiography is a painless test that uses sound waves to create images of your heart. It provides your doctor with information about the size and shape of your heart and how well your heart's chambers and valves are working. This procedure takes approximately one hour. There are no restrictions for this procedure.  Your physician recommends that you schedule a follow-up appointment in: 2 weeks w/ Dr. Fletcher Anon or Ignacia Bayley, NP

## 2014-01-19 LAB — TSH: TSH: 1.87 u[IU]/mL (ref 0.450–4.500)

## 2014-01-19 LAB — MAGNESIUM: MAGNESIUM: 2.2 mg/dL (ref 1.6–2.6)

## 2014-01-20 ENCOUNTER — Other Ambulatory Visit (INDEPENDENT_AMBULATORY_CARE_PROVIDER_SITE_OTHER): Payer: 59

## 2014-01-20 ENCOUNTER — Other Ambulatory Visit: Payer: Self-pay

## 2014-01-20 DIAGNOSIS — I480 Paroxysmal atrial fibrillation: Secondary | ICD-10-CM

## 2014-01-20 DIAGNOSIS — R079 Chest pain, unspecified: Secondary | ICD-10-CM

## 2014-01-20 DIAGNOSIS — I4891 Unspecified atrial fibrillation: Secondary | ICD-10-CM

## 2014-01-21 NOTE — ED Provider Notes (Signed)
Medical screening examination/treatment/procedure(s) were performed by non-physician practitioner and as supervising physician I was immediately available for consultation/collaboration.   EKG Interpretation   Date/Time:  Tuesday January 17 2014 20:09:57 EDT Ventricular Rate:  90 PR Interval:  190 QRS Duration: 74 QT Interval:  354 QTC Calculation: 433 R Axis:   7 Text Interpretation:  Normal sinus rhythm Anterior infarct , age  undetermined Abnormal ECG No old tracing to compare Confirmed by Aariana Shankland   MD, Jarah Pember ([id number]) on 01/17/2014 10:57:30 PM       Kalman Drape, MD 01/21/14 1943

## 2014-02-01 ENCOUNTER — Ambulatory Visit (INDEPENDENT_AMBULATORY_CARE_PROVIDER_SITE_OTHER): Payer: 59 | Admitting: Internal Medicine

## 2014-02-01 ENCOUNTER — Encounter: Payer: Self-pay | Admitting: *Deleted

## 2014-02-01 ENCOUNTER — Encounter: Payer: Self-pay | Admitting: Internal Medicine

## 2014-02-01 VITALS — BP 142/88 | HR 63 | Temp 97.7°F | Ht 67.5 in | Wt 175.2 lb

## 2014-02-01 DIAGNOSIS — Z0000 Encounter for general adult medical examination without abnormal findings: Secondary | ICD-10-CM

## 2014-02-01 DIAGNOSIS — R030 Elevated blood-pressure reading, without diagnosis of hypertension: Secondary | ICD-10-CM

## 2014-02-01 DIAGNOSIS — I471 Supraventricular tachycardia, unspecified: Secondary | ICD-10-CM

## 2014-02-01 DIAGNOSIS — Z1211 Encounter for screening for malignant neoplasm of colon: Secondary | ICD-10-CM

## 2014-02-01 DIAGNOSIS — I498 Other specified cardiac arrhythmias: Secondary | ICD-10-CM

## 2014-02-01 DIAGNOSIS — IMO0001 Reserved for inherently not codable concepts without codable children: Secondary | ICD-10-CM

## 2014-02-01 LAB — COMPREHENSIVE METABOLIC PANEL
ALBUMIN: 3.9 g/dL (ref 3.5–5.2)
ALK PHOS: 69 U/L (ref 39–117)
ALT: 24 U/L (ref 0–53)
AST: 22 U/L (ref 0–37)
BILIRUBIN TOTAL: 0.8 mg/dL (ref 0.2–1.2)
BUN: 17 mg/dL (ref 6–23)
CO2: 28 meq/L (ref 19–32)
Calcium: 9.6 mg/dL (ref 8.4–10.5)
Chloride: 104 mEq/L (ref 96–112)
Creatinine, Ser: 1.3 mg/dL (ref 0.4–1.5)
GFR: 59.95 mL/min — AB (ref >60.00)
GLUCOSE: 94 mg/dL (ref 70–99)
Potassium: 4.3 mEq/L (ref 3.5–5.1)
Sodium: 140 mEq/L (ref 135–145)
TOTAL PROTEIN: 6.8 g/dL (ref 6.0–8.3)

## 2014-02-01 LAB — LIPID PANEL
CHOLESTEROL: 155 mg/dL (ref 0–200)
HDL: 52.3 mg/dL (ref >39.00)
LDL Cholesterol: 80 mg/dL (ref 0–99)
NonHDL: 102.7
Total CHOL/HDL Ratio: 3
Triglycerides: 114 mg/dL (ref 0.0–149.0)
VLDL: 22.8 mg/dL (ref 0.0–40.0)

## 2014-02-01 NOTE — Progress Notes (Signed)
Pre visit review using our clinic review tool, if applicable. No additional management support is needed unless otherwise documented below in the visit note. 

## 2014-02-01 NOTE — Assessment & Plan Note (Signed)
General medical exam normal today. Encouraged healthy diet and exercise. Follow up with cardiology as scheduled. Will check lipids and CMP today. PSA check today. Discussed limitations of PSA testing.
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
## 2014-02-01 NOTE — Assessment & Plan Note (Signed)
BP Readings from Last 3 Encounters:  02/01/14 142/88  01/18/14 140/92  01/18/14 151/85   BP elevated today, however he reports better controlled at home. Follow up with cardiology tomorrow. He may benefit from daily Diltiazem, however he has been reluctant to add scheduled med in the past.

## 2014-02-01 NOTE — Assessment & Plan Note (Signed)
Reviewed recent cardiology notes. Continue Holter monitor. Diltiazem prn for HR >120. Follow up with Dr. Fletcher Anon tomorrow.

## 2014-02-01 NOTE — Patient Instructions (Signed)

## 2014-02-02 ENCOUNTER — Encounter: Payer: Self-pay | Admitting: *Deleted

## 2014-02-02 ENCOUNTER — Encounter: Payer: Self-pay | Admitting: Cardiovascular Disease

## 2014-02-02 ENCOUNTER — Telehealth: Payer: Self-pay | Admitting: Internal Medicine

## 2014-02-02 ENCOUNTER — Ambulatory Visit (INDEPENDENT_AMBULATORY_CARE_PROVIDER_SITE_OTHER): Payer: 59 | Admitting: Cardiovascular Disease

## 2014-02-02 VITALS — BP 142/86 | HR 77 | Ht 67.5 in | Wt 176.8 lb

## 2014-02-02 DIAGNOSIS — I4891 Unspecified atrial fibrillation: Secondary | ICD-10-CM

## 2014-02-02 DIAGNOSIS — I471 Supraventricular tachycardia: Secondary | ICD-10-CM

## 2014-02-02 DIAGNOSIS — E785 Hyperlipidemia, unspecified: Secondary | ICD-10-CM

## 2014-02-02 DIAGNOSIS — I498 Other specified cardiac arrhythmias: Secondary | ICD-10-CM

## 2014-02-02 DIAGNOSIS — I480 Paroxysmal atrial fibrillation: Secondary | ICD-10-CM

## 2014-02-02 LAB — PSA, TOTAL AND FREE
PSA FREE: 0.54 ng/mL
PSA, Free Pct: 25 % (ref >25)
PSA: 2.17 ng/mL (ref <=4.00)

## 2014-02-02 NOTE — Telephone Encounter (Signed)
Notified pt. 
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
## 2014-02-02 NOTE — Patient Instructions (Signed)
Continue same medications.   Your physician wants you to follow-up in: 6 months.  You will receive a reminder letter in the mail two months in advance. If you don't receive a letter, please call our office to schedule the follow-up appointment.  

## 2014-02-02 NOTE — Assessment & Plan Note (Signed)
Lab Results  Component Value Date   CHOL 155 02/01/2014   HDL 52.30 02/01/2014   LDLCALC 80 02/01/2014   LDLDIRECT 97.2 06/09/2013   TRIG 114.0 02/01/2014   CHOLHDL 3 02/01/2014   Lipid profile was optimal on current treatment.

## 2014-02-02 NOTE — Telephone Encounter (Signed)
The patient is wanting lab results.

## 2014-02-02 NOTE — Assessment & Plan Note (Signed)
The patient had one documented episode of atrial fibrillation with rapid ventricular response in the setting of dehydration and excessive caffeine intake. No further episodes since then. Echocardiogram showed no significant abnormalities. Outpatient telemetry so far does not show arrhythmia. Continue treatment with diltiazem as needed. CHADS VASc score is 0.

## 2014-03-02 ENCOUNTER — Telehealth: Payer: Self-pay | Admitting: *Deleted

## 2014-03-02 NOTE — Telephone Encounter (Signed)
Informed patient that per Dr. Fletcher Anon patients holter showed: Normal Sinus rhythm with no significant arrhythmias Patient verbalized understanding

## 2014-03-08 ENCOUNTER — Ambulatory Visit (INDEPENDENT_AMBULATORY_CARE_PROVIDER_SITE_OTHER): Payer: 59 | Admitting: *Deleted

## 2014-03-08 ENCOUNTER — Other Ambulatory Visit: Payer: Self-pay

## 2014-03-08 DIAGNOSIS — I480 Paroxysmal atrial fibrillation: Secondary | ICD-10-CM

## 2014-03-08 DIAGNOSIS — I479 Paroxysmal tachycardia, unspecified: Secondary | ICD-10-CM

## 2014-05-05 ENCOUNTER — Telehealth: Payer: Self-pay | Admitting: Internal Medicine
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
## 2014-05-05 NOTE — Telephone Encounter (Signed)
3:30pm on Monday. 92min

## 2014-05-05 NOTE — Telephone Encounter (Signed)
Patient is scheduled. Thanks!

## 2014-05-05 NOTE — Telephone Encounter (Signed)
Do you want to work him in somewhere next week?

## 2014-05-05 NOTE — Telephone Encounter (Signed)
4pm Tuesday

## 2014-05-05 NOTE — Telephone Encounter (Signed)
Left message for pt to return my call.

## 2014-05-05 NOTE — Telephone Encounter (Signed)
Someone is in the 3:30 slot on Monday already

## 2014-05-09 ENCOUNTER — Ambulatory Visit: Payer: 59 | Admitting: Internal Medicine

## 2014-06-13 ENCOUNTER — Telehealth: Payer: Self-pay | Admitting: Physician Assistant

## 2014-06-13 ENCOUNTER — Telehealth: Payer: Self-pay | Admitting: Cardiology

## 2014-06-13 NOTE — Telephone Encounter (Signed)
Paged regarding hypertension with SBP to 198. Patient states he had a cortisol shot today and felt "a little winded" today and took his BP and found it to be elevated. HR 115. Doesn't feel like when he had AF in the past. He took a 30mg  of diltiazem and BP still elevated. Reports a very mild HA, otherwise no symptoms. States he read that hypertension can be a side effect of cortisone shorts and is concerned. Patient with known white coat HTN.  I instructed the patient to relax, take an additional 30mg  dose of diltiazem and tylenol, and recheck BP in 1 hour. So long as BP improves and he feels well, no further medical attention is required.  He is aware that he needs to seek medical attention with worsened BP, HA, or development of CP.

## 2014-06-13 NOTE — Telephone Encounter (Signed)
Patient contacted the after hour cardiology service complaining of SOB and hypertension after a steroid injection in the shoulder. Mid conversion he was contacted by another doctor. I was placed on hold for 5 min, it appears Dr. Allen Kell, the overnight fellow was the one who contacted the patient. Patient displayed gratitude for contacting him. Dr. Allen Kell has instructed the patient regarding the issue.  Hilbert Corrigan PA Pager: [phone]

## 2014-07-09 ENCOUNTER — Other Ambulatory Visit: Payer: Self-pay | Admitting: Internal Medicine

## 2014-07-12 ENCOUNTER — Other Ambulatory Visit: Payer: Self-pay | Admitting: Internal Medicine

## 2014-07-20 ENCOUNTER — Telehealth: Payer: Self-pay | Admitting: Internal Medicine

## 2014-07-20 NOTE — Telephone Encounter (Signed)
Mr Roseboom called regarding his HR being 150 bpm while he was in his office moving things.  Says he feels his heart fluttering around.  In response to this, he took diltiazem 30 mg po which he has to take on a prn basis for HR > 120 bpm about 1 hour prior to call.  While on the phone I asked him to recheck his HR and he reports it is about 110 bpm.  He is asymptomatic during our conversation and specifically denied any dizziness, lightheadedness, CP, SOB.  We discussed options including monitoring his HR and BP and continuing to take the diltiazem prn with plans on calling Dr. Tyrell Antonio office in the AM since he is hemodynamically stable and asymptomatic currently and seeking medical attention if any of the above change vs doing so now.  He agreed with plan on using prn dilt and calling office in AM.  He was appreciative of phone call and advice.  Sandhya Denherder 07/20/2014 10:48 PM

## 2014-07-21 ENCOUNTER — Other Ambulatory Visit: Payer: Self-pay

## 2014-07-21 MED ORDER — DILTIAZEM HCL ER COATED BEADS 120 MG PO CP24: 120.0000 mg | ORAL_CAPSULE | Freq: Every day | ORAL | Status: DC

## 2014-07-21 NOTE — Telephone Encounter (Signed)
Start Diltiazem ER 120 mg once daily. Schedule follow up with me.

## 2014-07-21 NOTE — Addendum Note (Signed)
Addended by: Dede Query R on: 07/21/2014 10:29 AM   Modules accepted: Orders

## 2014-07-21 NOTE — Telephone Encounter (Signed)
Spoke w/ pt.  Advised him of Dr. Tyrell Antonio recommendation.  He is agreeable and is sched to see Dr. Fletcher Anon 07/24/14 @ 10:15.

## 2014-07-24 ENCOUNTER — Ambulatory Visit (INDEPENDENT_AMBULATORY_CARE_PROVIDER_SITE_OTHER): Payer: 59 | Admitting: Cardiovascular Disease

## 2014-07-24 ENCOUNTER — Encounter: Payer: Self-pay | Admitting: Cardiovascular Disease

## 2014-07-24 VITALS — BP 178/100 | HR 85 | Ht 69.0 in | Wt 178.5 lb

## 2014-07-24 DIAGNOSIS — IMO0001 Reserved for inherently not codable concepts without codable children: Secondary | ICD-10-CM

## 2014-07-24 DIAGNOSIS — R030 Elevated blood-pressure reading, without diagnosis of hypertension: Secondary | ICD-10-CM

## 2014-07-24 DIAGNOSIS — E785 Hyperlipidemia, unspecified: Secondary | ICD-10-CM

## 2014-07-24 DIAGNOSIS — I480 Paroxysmal atrial fibrillation: Secondary | ICD-10-CM

## 2014-07-24 DIAGNOSIS — R000 Tachycardia, unspecified: Secondary | ICD-10-CM

## 2014-07-24 NOTE — Patient Instructions (Signed)
Continue to use Diltiazem 30 mg as needed for palpitations.   Your physician wants you to follow-up in: 6 months.  You will receive a reminder letter in the mail two months in advance. If you don't receive a letter, please call our office to schedule the follow-up appointment.
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
## 2014-07-30 NOTE — Assessment & Plan Note (Signed)
Symptoms of palpitations improved overall. He can continue to use diltiazem 30 mg as needed for breakthrough. If symptoms become more frequent, an antiarrhythmic medication can be considered. Most of his episodes seems triggered by stress.

## 2014-07-30 NOTE — Assessment & Plan Note (Signed)
Continue treatment with atorvastatin with a target LDL of less than 100. 

## 2014-07-30 NOTE — Assessment & Plan Note (Signed)
He does seem to have white coat syndrome. I discussed with him the importance of controlling his stress and anxiety. He also has poor quality sleep at night.

## 2014-11-28 LAB — HM COLONOSCOPY

## 2014-12-08 LAB — HM COLONOSCOPY

## 2014-12-18 ENCOUNTER — Telehealth: Payer: Self-pay | Admitting: *Deleted

## 2014-12-18 NOTE — Telephone Encounter (Signed)
He is on Cardizem CD 120 mg daily. He has previously been advised to take diltiazem 30 mg q 6 hours prn for heart rate greater than 120 or palpitations. His CHADSVASc calculates out to be 1 (HTN). It was documented if his symptoms were to become more frequent an antiarrhythmic could be considered. Given this episode sounds to be transient (IRS audit) an antiarrhythmic may not quite be indicated at this time.   Should he start to get worked up, feel palpitations, or have an elevated heart rate he could take an extra diltiazem 30 mg. If he is feeling well then do not take anything extra. If he is constantly feeling his heart racing or palpitations then perhaps we need to add in another daily medication such as a beta blocker. Just let me know.

## 2014-12-18 NOTE — Telephone Encounter (Signed)
Patient c/o Palpitations:  High priority if patient c/o lightheadedness and shortness of breath.  1. How long have you been having palpitations? 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 Are you currently experiencing lightheadedness and shortness of breath?  3. Have you checked your BP and heart rate? (document readings)   4. Are you experiencing any other symptoms? He is calling to prevent going into afib during this stressfull time

## 2014-12-19 NOTE — Telephone Encounter (Signed)
Spoke w/ pt.  Advised him of Ryan's recommendation.   He states that he has not been taking his daily meds as prescribed, as he read on the internet about potential side effects and decided not to take them.  Discussed this w/ him at length and he is agreeable to takings his meds and keeping his f/u appt.  Asked him to call back w/ any further questions or concerns.

## 2015-02-12 ENCOUNTER — Encounter: Payer: Self-pay | Admitting: Cardiovascular Disease

## 2015-02-12 ENCOUNTER — Ambulatory Visit (INDEPENDENT_AMBULATORY_CARE_PROVIDER_SITE_OTHER): Payer: 59 | Admitting: Cardiovascular Disease

## 2015-02-12 VITALS — BP 158/98 | HR 79 | Ht 69.0 in | Wt 178.5 lb

## 2015-02-12 DIAGNOSIS — R030 Elevated blood-pressure reading, without diagnosis of hypertension: Secondary | ICD-10-CM | POA: Diagnosis not present

## 2015-02-12 DIAGNOSIS — E785 Hyperlipidemia, unspecified: Secondary | ICD-10-CM

## 2015-02-12 DIAGNOSIS — IMO0001 Reserved for inherently not codable concepts without codable children: Secondary | ICD-10-CM

## 2015-02-12 DIAGNOSIS — I480 Paroxysmal atrial fibrillation: Secondary | ICD-10-CM | POA: Diagnosis not present

## 2015-02-12 NOTE — Assessment & Plan Note (Signed)
No evidence of recurrent arrhythmia. He has not required diltiazem which he has to be used as needed for breakthrough palpitations.
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
## 2015-02-12 NOTE — Patient Instructions (Signed)
Medication Instructions: Continue same medications.   Labwork: None.   Procedures/Testing: None.   Follow-Up: 1 year with Dr. Fletcher Anon.    Any Additional Special Instructions Will Be Listed Below (If Applicable).  Schedule a routine follow up appointment with Dr. Gilford Rile

## 2015-02-12 NOTE — Assessment & Plan Note (Signed)
I suggested that he bring his blood pressure machine to his next appointment to make sure that it is accurate. He reports normal blood pressure readings at home.

## 2015-02-12 NOTE — Assessment & Plan Note (Signed)
He is currently being treated with atorvastatin. I advised him to schedule a routine follow-up visit with Dr. Gilford Rile as he is overdue for an appointment.

## 2015-02-14 ENCOUNTER — Telehealth: Payer: Self-pay | Admitting: Physician Assistant

## 2015-02-14 NOTE — Telephone Encounter (Signed)
The patient called to report his HR was 160 and BP 204/170.  He took 60 mg of diltiazem and BP now 170/108 with a HR of 120.  HE is currently asymtomatic accept for feeling his heart racing.  I instructed him to wait a couple more hours and recheck BP and HR.  If > 130/80 with HR > 100, take another 30mg  dilt.  IF HR goes back to 160, go to ER.  Call office in AM to be seen  Yakira Duquette PAC

## 2015-02-15 NOTE — Telephone Encounter (Signed)
If these had episodes are happening frequently , I recommend that we place him on diltiazem extended release 120 mg once daily and he can continue to use short-acting diltiazem for breakthrough palpitations.

## 2015-02-16 NOTE — Telephone Encounter (Signed)
Attempted to call pt. Phone number busy. Will call back

## 2015-02-23 ENCOUNTER — Telehealth: Payer: Self-pay | Admitting: Cardiovascular Disease

## 2015-02-23 NOTE — Telephone Encounter (Signed)
Attempted to call pt. No answer, no VM. Will call again

## 2015-02-23 NOTE — Telephone Encounter (Signed)
Patient returning call.     Please call patient back on cell.

## 2015-02-23 NOTE — Telephone Encounter (Signed)
S/w pt who states he has been doing well since he took the extra diltiazam after he called on 8/24 Reports BP 110s  and HR in the 60s at this time Reviewed Dr. Tyrell Antonio recommendations. Pt states he will continue to monitor and call if HR and BP elevated. Pt had no further questions.

## 2015-03-29 ENCOUNTER — Ambulatory Visit (INDEPENDENT_AMBULATORY_CARE_PROVIDER_SITE_OTHER): Payer: 59 | Admitting: Internal Medicine

## 2015-03-29 ENCOUNTER — Encounter: Payer: Self-pay | Admitting: Internal Medicine

## 2015-03-29 VITALS — BP 135/80 | HR 72 | Temp 97.5°F | Ht 67.0 in | Wt 176.1 lb

## 2015-03-29 DIAGNOSIS — Z0000 Encounter for general adult medical examination without abnormal findings: Secondary | ICD-10-CM | POA: Diagnosis not present

## 2015-03-29 LAB — CBC WITH DIFFERENTIAL/PLATELET
BASOS ABS: 0 10*3/uL (ref 0.0–0.1)
BASOS PCT: 0.6 % (ref 0.0–3.0)
EOS ABS: 0.7 10*3/uL (ref 0.0–0.7)
Eosinophils Relative: 8.3 % — ABNORMAL HIGH (ref 0.0–5.0)
HEMATOCRIT: 44.2 % (ref 39.0–52.0)
HEMOGLOBIN: 14.9 g/dL (ref 13.0–17.0)
Lymphocytes Relative: 36.4 % (ref 12.0–46.0)
Lymphs Abs: 2.9 10*3/uL (ref 0.7–4.0)
MCHC: 33.8 g/dL (ref 30.0–36.0)
MCV: 94.5 fl (ref 78.0–100.0)
MONO ABS: 0.6 10*3/uL (ref 0.1–1.0)
Monocytes Relative: 7.9 % (ref 3.0–12.0)
Neutro Abs: 3.7 10*3/uL (ref 1.4–7.7)
Neutrophils Relative %: 46.8 % (ref 43.0–77.0)
Platelets: 230 10*3/uL (ref 150.0–400.0)
RBC: 4.68 Mil/uL (ref 4.22–5.81)
RDW: 13.8 % (ref 11.5–15.5)
WBC: 7.9 10*3/uL (ref 4.0–10.5)

## 2015-03-29 LAB — COMPREHENSIVE METABOLIC PANEL
ALBUMIN: 4.1 g/dL (ref 3.5–5.2)
ALT: 21 U/L (ref 0–53)
AST: 19 U/L (ref 0–37)
Alkaline Phosphatase: 69 U/L (ref 39–117)
BILIRUBIN TOTAL: 0.5 mg/dL (ref 0.2–1.2)
BUN: 14 mg/dL (ref 6–23)
CALCIUM: 9.6 mg/dL (ref 8.4–10.5)
CHLORIDE: 104 meq/L (ref 96–112)
CO2: 29 mEq/L (ref 19–32)
CREATININE: 1.16 mg/dL (ref 0.40–1.50)
GFR: 67.52 mL/min (ref >60.00)
Glucose, Bld: 105 mg/dL — ABNORMAL HIGH (ref 70–99)
Potassium: 4.4 mEq/L (ref 3.5–5.1)
SODIUM: 141 meq/L (ref 135–145)
Total Protein: 7.3 g/dL (ref 6.0–8.3)

## 2015-03-29 LAB — LIPID PANEL
CHOLESTEROL: 161 mg/dL (ref 0–200)
HDL: 56.3 mg/dL (ref >39.00)
LDL CALC: 84 mg/dL (ref 0–99)
NonHDL: 104.72
TRIGLYCERIDES: 102 mg/dL (ref 0.0–149.0)
Total CHOL/HDL Ratio: 3
VLDL: 20.4 mg/dL (ref 0.0–40.0)

## 2015-03-29 MED ORDER — ATORVASTATIN CALCIUM 20 MG PO TABS: 20.0000 mg | ORAL_TABLET | Freq: Every day | ORAL | Status: DC

## 2015-03-29 NOTE — Patient Instructions (Signed)

## 2015-03-29 NOTE — Assessment & Plan Note (Signed)
General medical exam normal today. Discussed screening for prostate cancer with PSA. He understands limitations of this test. Colonoscopy reviewed and UTD. Flu vaccine declined. Encouraged healthy diet and exercise.

## 2015-03-29 NOTE — Progress Notes (Signed)
Pre visit review using our clinic review tool, if applicable. No additional management support is needed unless otherwise documented below in the visit note. 
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
## 2015-03-30 ENCOUNTER — Encounter: Payer: Self-pay | Admitting: *Deleted

## 2015-03-30 LAB — PSA, TOTAL AND FREE
PSA, Free Pct: 26 % (ref >25)
PSA, Free: 0.5 ng/mL
PSA: 1.89 ng/mL (ref <=4.00)

## 2015-06-22 ENCOUNTER — Ambulatory Visit (INDEPENDENT_AMBULATORY_CARE_PROVIDER_SITE_OTHER): Payer: 59 | Admitting: Family Medicine

## 2015-06-22 ENCOUNTER — Encounter: Payer: Self-pay | Admitting: Family Medicine

## 2015-06-22 ENCOUNTER — Telehealth: Payer: Self-pay | Admitting: Internal Medicine

## 2015-06-22 VITALS — BP 120/88 | HR 74 | Temp 98.0°F | Ht 67.0 in | Wt 178.0 lb

## 2015-06-22 DIAGNOSIS — J988 Other specified respiratory disorders: Secondary | ICD-10-CM

## 2015-06-22 MED ORDER — DOXYCYCLINE HYCLATE 100 MG PO TABS: 100.0000 mg | ORAL_TABLET | Freq: Two times a day (BID) | ORAL | Status: DC

## 2015-06-22 MED ORDER — PREDNISONE 50 MG PO TABS
ORAL_TABLET | ORAL | Status: DC
Start: 2015-06-22 — End: 2016-03-31

## 2015-06-22 MED ORDER — HYDROCOD POLST-CPM POLST ER 10-8 MG/5ML PO SUER: 5.0000 mL | Freq: Two times a day (BID) | ORAL | Status: DC | PRN

## 2015-06-22 MED ORDER — FLUTICASONE PROPIONATE 50 MCG/ACT NA SUSP: 2.0000 | Freq: Every day | NASAL | Status: DC

## 2015-06-22 NOTE — Assessment & Plan Note (Signed)
New problem. Patient with symptoms of upper or lower tract infection. Exam benign. Treating nasal congestion with Flonase.  Treating cough with Tussionex and prednisone. I informed him that I did not think antibiotics are warranted. However, given the duration of his illness I did give him a prescription for doxycycline in case he fails to improve with the above treatment.

## 2015-06-22 NOTE — Progress Notes (Signed)
Pre visit review using our clinic review tool, if applicable. No additional management support is needed unless otherwise documented below in the visit note. 

## 2015-06-22 NOTE — Telephone Encounter (Signed)
Spoke with the patient.  He is having sinus issues for the past month.  Including sinus pressure, headaches, nagging cough and yellow green mucous.  Patient has been taking Mucinex-D and it was initally helping but in the past week it hasn't.  He is nervous about taking pain medications for the headache as he is in afib and in the past his BP doesn;t tolerate the medications.    I mentioned a possible appointment for antibiotics and He states that you had an appointment with him approximately 6-8 weeks ago and that if he needed anything to call.  Last time he was prescribed a zpack and it didn't work.   Please advise?

## 2015-06-22 NOTE — Telephone Encounter (Signed)
The patient left a triage voice mail wanting medication called to the pharmacy for a sinus infection.

## 2015-06-22 NOTE — Telephone Encounter (Signed)
Needs to be seen if failed azithromycin.

## 2015-06-22 NOTE — [place] Notes (Signed)
Subjective:  Patient ID: Dalton Sanders, male    DOB: Mar 16, 1951  Age: 64 y.o. MRN: [id number]  CC: Cough, congestion  HPI:  64 year old male returns to clinic today for an acute visit with the above complaints.  Cough, congestion  Patient states that he's had a cough for approximately 1 month.   He states that it is intermittently productive.  Significant nasal congestion as well as headache.  No associated fevers or chills.  No associated shortness of breath.  He's been taking Mucinex with no resolution in his symptoms.  No known exacerbating factors.  Social Hx   Social History   Social History  . Marital Status: Married    Spouse Name: N/A  . Number of Children: N/A  . Years of Education: N/A   Social History Main Topics  . Smoking status: Never Smoker   . Smokeless tobacco: Never Used  . Alcohol Use: No  . Drug Use: No  . Sexual Activity: Not Asked   Other Topics Concern  . None   Social History Narrative   Lives in [place] with wife. 3 children, daughter and 2 sons.      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      Diet - Healthy, limited red meat      Exercise - horseback riding, walks   Review of Systems  Constitutional: Negative for fever and chills.  HENT: Positive for congestion.   Respiratory: Positive for cough. Negative for shortness of breath.     Objective:  BP 120/88 mmHg  Pulse 74  Temp(Src) 98 F (36.7 C) (Oral)  Ht 5\' 7"  (1.702 m)  Wt 178 lb (80.74 kg)  BMI 27.87 kg/m2  SpO2 95%  BP/Weight 06/22/2015 03/29/2015 [id number]  Systolic BP 123456 [id number]  Diastolic BP 88 80 98  Wt. (Lbs) 178 176.13 178.5  BMI 27.87 27.58 26.35   Physical Exam  Constitutional: He is oriented to person, place, and time. He appears well-developed. No distress.  HENT:  Head: Normocephalic and atraumatic.  Mouth/Throat: Oropharynx is clear and moist. No oropharyngeal exudate.  L TM with effusion. Normal R TM.   Neck: Neck supple.    Cardiovascular: Normal rate and regular rhythm.   Pulmonary/Chest: Effort normal and breath sounds normal. No respiratory distress. He has no wheezes. He has no rales.  Lymphadenopathy:    He has no cervical adenopathy.  Neurological: He is alert and oriented to person, place, and time.  Vitals reviewed.  Lab Results  Component Value Date   WBC 7.9 03/29/2015   HGB 14.9 03/29/2015   HCT 44.2 03/29/2015   PLT 230.0 03/29/2015   GLUCOSE 105* 03/29/2015   CHOL 161 03/29/2015   TRIG 102.0 03/29/2015   HDL 56.30 03/29/2015   LDLDIRECT 97.2 06/09/2013   LDLCALC 84 03/29/2015   ALT 21 03/29/2015   AST 19 03/29/2015   NA 141 03/29/2015   K 4.4 03/29/2015   CL 104 03/29/2015   CREATININE 1.16 03/29/2015   BUN 14 03/29/2015   CO2 29 03/29/2015   TSH 1.870 01/18/2014   PSA 1.89 03/29/2015   MICROALBUR 1.3 04/15/2013    Assessment & Plan:   Problem List Items Addressed This Visit    Respiratory infection - Primary    New problem. Patient with symptoms of upper or lower tract infection. Exam benign. Treating nasal congestion with Flonase.  Treating cough with Tussionex and prednisone. I informed him that I did not think antibiotics are warranted. However,  given the duration of his illness I did give him a prescription for doxycycline in case he fails to improve with the above treatment.         Meds ordered this encounter  Medications  . doxycycline (VIBRA-TABS) 100 MG tablet    Sig: Take 1 tablet (100 mg total) by mouth 2 (two) times daily.    Dispense:  14 tablet    Refill:  0  . fluticasone (FLONASE) 50 MCG/ACT nasal spray    Sig: Place 2 sprays into both nostrils daily.    Dispense:  16 g    Refill:  6  . chlorpheniramine-HYDROcodone (TUSSIONEX PENNKINETIC ER) 10-8 MG/5ML SUER    Sig: Take 5 mLs by mouth every 12 (twelve) hours as needed.    Dispense:  115 mL    Refill:  0  . predniSONE (DELTASONE) 50 MG tablet    Sig: 1 tablet daily x 5 days.    Dispense:  5  tablet    Refill:  0    Follow-up: PRN  [place]

## 2015-06-22 NOTE — Telephone Encounter (Signed)
Patient seen today

## 2015-06-22 NOTE — Patient Instructions (Signed)
Use the flonase as directed.  Take the prednisone as directed.  Use the cough syrup as needed.  If you fail to improve or worsen you can go pick up the antibiotic.  Take care  Dr. Lacinda Axon

## 2015-06-22 NOTE — Telephone Encounter (Signed)
Spoke with patient, he is going to be seen this afternoon.

## 2015-06-27 ENCOUNTER — Telehealth: Payer: Self-pay | Admitting: Internal Medicine

## 2015-06-27 NOTE — Telephone Encounter (Signed)
Dalton Sanders called regarding his HR being 110-140 bpm, BP is mildly elevated.  States he feels palpitations and is tired but otherwise no symptoms.  Took 2, 30 mg dilt tabs at 1900. Also states his mother passed away 2 weeks ago and has been struggling with this in addition to a sinus infection for which he is taking medications including decongestants.  Advised to continue the dilt 30-60 mg q 6hr prn HR > 120 bpm and to call Dr. Tyrell Antonio office in AM or to go to ED if he becomes more symptomatic at some point.  He specifically denied any dizziness, lightheadedness, CP, SOB. We discussed options including monitoring his HR and BP and continuing to take the diltiazem prn with plans on calling Dr. Tyrell Antonio office in the AM since he is hemodynamically stable and asymptomatic currently and seeking medical attention if any of the above change vs doing so now. He agreed with plan on using prn dilt and calling office in AM. He was appreciative of phone call and advice.

## 2015-07-01 NOTE — Telephone Encounter (Signed)
Please call the patient and see if he is still having problems.

## 2015-07-02 ENCOUNTER — Telehealth: Payer: Self-pay

## 2015-07-02 ENCOUNTER — Telehealth: Payer: Self-pay | Admitting: *Deleted

## 2015-07-02 NOTE — Telephone Encounter (Signed)
We can call in Diflucan 100mg  daily x 5 days.

## 2015-07-02 NOTE — Telephone Encounter (Signed)
Pt was given antibiotic doxycyline 100mg  that caused him to have thrush in his mouth, Pt wants RX called in for thrush mouth. Please advise/tvw

## 2015-07-02 NOTE — Telephone Encounter (Signed)
Patient stated that he has thrush mouth from taking his antibiotic doxycycline. He requested a medication to treat his thrush. He stated that the prednisone gave him A-fib. Please Advise  [phone] [facility] in [place]

## 2015-07-02 NOTE — Telephone Encounter (Signed)
Notified pt that Dr. Gilford Rile sent RX for Diflucan, also called in Rx to pharmacy per Dr. Derry Skill request./tvw

## 2015-07-02 NOTE — Telephone Encounter (Signed)
S/w pt who states he was given 50mg  prednisone and abx last week before the Jan 4 call to Dr. Philbert Riser. States after the dose of prednisone, he experienced increased HR and felt to be in afib. He has not taken prednisone since and reports normal HR and BP readings. Advised pt to continue to monitor and report if he becomes symptomatic.

## 2015-07-04 ENCOUNTER — Emergency Department
Admission: EM | Admit: 2015-07-04 | Discharge: 2015-07-04 | Disposition: A | Discharge status: Home / Self Care | Payer: 59 | Attending: Emergency Medicine | Admitting: Emergency Medicine

## 2015-07-04 DIAGNOSIS — S4992XA Unspecified injury of left shoulder and upper arm, initial encounter: Secondary | ICD-10-CM | POA: Diagnosis not present

## 2015-07-04 DIAGNOSIS — S00511A Abrasion of lip, initial encounter: Secondary | ICD-10-CM | POA: Insufficient documentation

## 2015-07-04 DIAGNOSIS — Y9389 Activity, other specified: Secondary | ICD-10-CM | POA: Insufficient documentation

## 2015-07-04 DIAGNOSIS — S0083XA Contusion of other part of head, initial encounter: Secondary | ICD-10-CM | POA: Diagnosis not present

## 2015-07-04 DIAGNOSIS — Y998 Other external cause status: Secondary | ICD-10-CM | POA: Diagnosis not present

## 2015-07-04 DIAGNOSIS — Z79899 Other long term (current) drug therapy: Secondary | ICD-10-CM | POA: Diagnosis not present

## 2015-07-04 DIAGNOSIS — S4991XA Unspecified injury of right shoulder and upper arm, initial encounter: Secondary | ICD-10-CM | POA: Diagnosis not present

## 2015-07-04 DIAGNOSIS — Y9289 Other specified places as the place of occurrence of the external cause: Secondary | ICD-10-CM | POA: Diagnosis not present

## 2015-07-04 DIAGNOSIS — S199XXA Unspecified injury of neck, initial encounter: Secondary | ICD-10-CM | POA: Diagnosis not present

## 2015-07-04 DIAGNOSIS — I10 Essential (primary) hypertension: Secondary | ICD-10-CM | POA: Diagnosis not present

## 2015-07-04 DIAGNOSIS — I4891 Unspecified atrial fibrillation: Secondary | ICD-10-CM | POA: Diagnosis present

## 2015-07-04 DIAGNOSIS — Z792 Long term (current) use of antibiotics: Secondary | ICD-10-CM | POA: Diagnosis not present

## 2015-07-04 DIAGNOSIS — Z23 Encounter for immunization: Secondary | ICD-10-CM | POA: Insufficient documentation

## 2015-07-04 DIAGNOSIS — I480 Paroxysmal atrial fibrillation: Secondary | ICD-10-CM | POA: Diagnosis not present

## 2015-07-04 DIAGNOSIS — Z7951 Long term (current) use of inhaled steroids: Secondary | ICD-10-CM | POA: Diagnosis not present

## 2015-07-04 DIAGNOSIS — Z7982 Long term (current) use of aspirin: Secondary | ICD-10-CM | POA: Diagnosis not present

## 2015-07-04 MED ORDER — TETANUS-DIPHTHERIA TOXOIDS TD 5-2 LFU IM INJ
0.5000 mL | INJECTION | Freq: Once | INTRAMUSCULAR | Status: DC
  Filled 2015-07-04: qty 0.5

## 2015-07-04 MED ORDER — TETANUS-DIPHTH-ACELL PERTUSSIS 5-2.5-18.5 LF-MCG/0.5 IM SUSP
INTRAMUSCULAR | Status: AC
  Administered 2015-07-04: 0.5 mL via INTRAMUSCULAR
  Filled 2015-07-04: qty 0.5

## 2015-07-04 MED ORDER — TETANUS-DIPHTH-ACELL PERTUSSIS 5-2.5-18.5 LF-MCG/0.5 IM SUSP
0.5000 mL | Freq: Once | INTRAMUSCULAR | Status: AC
  Administered 2015-07-04: 0.5 mL via INTRAMUSCULAR

## 2015-07-04 NOTE — ED Notes (Addendum)
Pt received 250 cc of NS with EMS
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
## 2015-07-04 NOTE — Discharge Instructions (Signed)
Atrial Fibrillation °Atrial fibrillation is a type of irregular or rapid heartbeat (arrhythmia). In atrial fibrillation, the heart quivers continuously in a chaotic pattern. This occurs when parts of the heart receive disorganized signals that make the heart unable to pump blood normally. This can increase the risk for stroke, heart failure, and other heart-related conditions. There are different types of atrial fibrillation, including: °· Paroxysmal atrial fibrillation. This type starts suddenly, and it usually stops on its own shortly after it starts. °· Persistent atrial fibrillation. This type often lasts longer than a week. It may stop on its own or with treatment. °· Long-lasting persistent atrial fibrillation. This type lasts longer than 12 months. °· Permanent atrial fibrillation. This type does not go away. °Talk with your health care provider to learn about the type of atrial fibrillation that you have. °CAUSES °This condition is caused by some heart-related conditions or procedures, including: °· A heart attack. °· Coronary artery disease. °· Heart failure. °· Heart valve conditions. °· High blood pressure. °· Inflammation of the sac that surrounds the heart (pericarditis). °· Heart surgery. °· Certain heart rhythm disorders, such as Wolf-Parkinson-White syndrome. °Other causes include: °· Pneumonia. °· Obstructive sleep apnea. °· Blockage of an artery in the lungs (pulmonary embolism, or PE). °· Lung cancer. °· Chronic lung disease. °· Thyroid problems, especially if the thyroid is overactive (hyperthyroidism). °· Caffeine. °· Excessive alcohol use or illegal drug use. °· Use of some medicines, including certain decongestants and diet pills. °Sometimes, the cause cannot be found. °RISK FACTORS °This condition is more likely to develop in: °· People who are older in age. °· People who smoke. °· People who have diabetes mellitus. °· People who are overweight (obese). °· Athletes who exercise  vigorously. °SYMPTOMS °Symptoms of this condition include: °· A feeling that your heart is beating rapidly or irregularly. °· A feeling of discomfort or pain in your chest. °· Shortness of breath. °· Sudden light-headedness or weakness. °· Getting tired easily during exercise. °In some cases, there are no symptoms. °DIAGNOSIS °Your health care provider may be able to detect atrial fibrillation when taking your pulse. If detected, this condition may be diagnosed with: °· An electrocardiogram (ECG). °· A Holter monitor test that records your heartbeat patterns over a 24-hour period. °· Transthoracic echocardiogram (TTE) to evaluate how blood flows through your heart. °· Transesophageal echocardiogram (TEE) to view more detailed images of your heart. °· A stress test. °· Imaging tests, such as a CT scan or chest X-ray. °· Blood tests. °TREATMENT °The main goals of treatment are to prevent blood clots from forming and to keep your heart beating at a normal rate and rhythm. The type of treatment that you receive depends on many factors, such as your underlying medical conditions and how you feel when you are experiencing atrial fibrillation. °This condition may be treated with: °· Medicine to slow down the heart rate, bring the heart's rhythm back to normal, or prevent clots from forming. °· Electrical cardioversion. This is a procedure that resets your heart's rhythm by delivering a controlled, low-energy shock to the heart through your skin. °· Different types of ablation, such as catheter ablation, catheter ablation with pacemaker, or surgical ablation. These procedures destroy the heart tissues that send abnormal signals. When the pacemaker is used, it is placed under your skin to help your heart beat in a regular rhythm. °HOME CARE INSTRUCTIONS °· Take over-the counter and prescription medicines only as told by your health care provider. °·   If your health care provider prescribed a blood-thinning medicine  (anticoagulant), take it exactly as told. Taking too much blood-thinning medicine can cause bleeding. If you do not take enough blood-thinning medicine, you will not have the protection that you need against stroke and other problems.  Do not use tobacco products, including cigarettes, chewing tobacco, and e-cigarettes. If you need help quitting, ask your health care provider.  If you have obstructive sleep apnea, manage your condition as told by your health care provider.  Do not drink alcohol.  Do not drink beverages that contain caffeine, such as coffee, soda, and tea.  Maintain a healthy weight. Do not use diet pills unless your health care provider approves. Diet pills may make heart problems worse.  Follow diet instructions as told by your health care provider.  Exercise regularly as told by your health care provider.  Keep all follow-up visits as told by your health care provider. This is important. PREVENTION  Avoid drinking beverages that contain caffeine or alcohol.  Avoid certain medicines, especially medicines that are used for breathing problems.  Avoid certain herbs and herbal medicines, such as those that contain ephedra or ginseng.  Do not use illegal drugs, such as cocaine and amphetamines.  Do not smoke.  Manage your high blood pressure. SEEK MEDICAL CARE IF:  You notice a change in the rate, rhythm, or strength of your heartbeat.  You are taking an anticoagulant and you notice increased bruising.  You tire more easily when you exercise or exert yourself. SEEK IMMEDIATE MEDICAL CARE IF:  You have chest pain, abdominal pain, sweating, or weakness.  You feel nauseous.  You notice blood in your vomit, bowel movement, or urine.  You have shortness of breath.  You suddenly have swollen feet and ankles.  You feel dizzy.  You have sudden weakness or numbness of the face, arm, or leg, especially on one side of the body.  You have trouble speaking,  trouble understanding, or both (aphasia).  Your face or your eyelid droops on one side. These symptoms may represent a serious problem that is an emergency. Do not wait to see if the symptoms will go away. Get medical help right away. Call your local emergency services (911 in the [place] Do not drive yourself to the hospital.   This information is not intended to replace advice given to you by your health care provider. Make sure you discuss any questions you have with your health care provider.   Document Released: 06/09/2005 Document Revised: 02/28/2015 Document Reviewed: 10/04/2014 Elsevier Interactive Patient Education 2016 [employer].  Abrasion An abrasion is a cut or scrape on the surface of your skin. An abrasion does not go through all of the layers of your skin. It is important to take good care of your abrasion to prevent infection. HOME CARE Medicines  Take or apply medicines only as told by your doctor.  If you were prescribed an antibiotic ointment, finish all of it even if you start to feel better. Wound Care  Clean the wound with mild soap and water 2-3 times per day or as told by your doctor. Pat your wound dry with a clean towel. Do not rub it.  There are many ways to close and cover a wound. Follow instructions from your doctor about:  How to take care of your wound.  When and how you should change your bandage (dressing).  When and how you should take off your dressing.  Check your wound every day  for signs of infection. Watch for:  Redness, swelling, or pain.  Fluid, blood, or pus. General Instructions  Keep the dressing dry as told by your doctor. Do not take baths, swim, use a hot tub, or do anything that would put your wound underwater until your doctor says it is okay.  If there is swelling, raise (elevate) the injured area above the level of your heart while you are sitting or lying down.  Keep all follow-up visits as told by your doctor. This is  important. GET HELP IF:  You were given a tetanus shot and you have any of these where the needle went in:  Swelling.  Very bad pain.  Redness.  Bleeding.  Medicine does not help your pain.  You have any of these at the site of the wound:  More redness.  More swelling.  More pain. GET HELP RIGHT AWAY IF:  You have a red streak going away from your wound.  You have a fever.  You have fluid, blood, or pus coming from your wound.  There is a bad smell coming from your wound.   This information is not intended to replace advice given to you by your health care provider. Make sure you discuss any questions you have with your health care provider.   Document Released: 11/26/2007 Document Revised: 10/24/2014 Document Reviewed: 06/07/2014 Elsevier Interactive Patient Education [employer].

## 2015-07-04 NOTE — ED Notes (Signed)
Pt alert and oriented X4, active, cooperative, pt in NAD. RR even and unlabored, color WNL.  Pt informed to return if any life threatening symptoms occur.   

## 2015-07-04 NOTE — ED Notes (Signed)
MD at bedside. 
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
## 2015-08-08 ENCOUNTER — Other Ambulatory Visit: Payer: Self-pay | Admitting: Internal Medicine

## 2015-09-10 ENCOUNTER — Other Ambulatory Visit: Payer: Self-pay

## 2015-09-11 ENCOUNTER — Other Ambulatory Visit: Payer: Self-pay

## 2015-09-11 MED ORDER — DILTIAZEM HCL ER COATED BEADS 120 MG PO CP24: 120.0000 mg | ORAL_CAPSULE | Freq: Every day | ORAL | Status: DC

## 2015-09-11 NOTE — Telephone Encounter (Signed)
Refill sent for Diltiazem 120 mg one tablet daily for a 90 day supply with 3 refills.

## 2015-09-18 ENCOUNTER — Other Ambulatory Visit: Payer: Self-pay

## 2015-09-18 ENCOUNTER — Telehealth: Payer: Self-pay | Admitting: Cardiovascular Disease

## 2015-09-18 MED ORDER — DILTIAZEM HCL 30 MG PO TABS
30.0000 mg | ORAL_TABLET | Freq: Four times a day (QID) | ORAL | Status: DC | PRN
Start: 2015-09-18 — End: 2017-03-20

## 2015-09-18 NOTE — Telephone Encounter (Signed)
Pt called, states he had an "afib attack" last night and took a Diltiazem, and noticed after he took the medication, noticed on the bottle it expired August 2016.  States he has had 2 episodes in the last 3 weeks. Please call.

## 2015-09-18 NOTE — Telephone Encounter (Signed)
S/w pt who reports an episode of afib last evening. He took an PRN cardizem (30mg ) and a 325 asa and reports NSR a "few hours later". States he feels better today. He did notice his PRN cardizem has expired. Reviewed s/s to monitor and when he would need to go to the ED. Pt verbalized understanding and will call if he has more frequent afib episodes. PRN cardizem refill submitted to [facility] [place].

## 2015-09-21 ENCOUNTER — Telehealth: Payer: Self-pay | Admitting: Cardiovascular Disease

## 2015-09-21 NOTE — Telephone Encounter (Signed)
S/w Brittney, PA, who is in our office today and has reviewed pt chart.  Per verbal from [place], pt is to continue to monitor s/s and report continues SOB w/exertion.  At that time, he may need to be evaluated for symptoms and need for further testing. S/w pt who verbalized understanding and is agreeable w/plan. Pt had no further questions.

## 2015-09-21 NOTE — Telephone Encounter (Signed)
Patient c/o low energy .  Today had sob and pain in chest feels like gas pain that comes and goes.  afib attack on Monday.    Pt c/o Shortness Of Breath: STAT if SOB developed within the last 24 hours or pt is noticeably SOB on the phone  1. Are you currently SOB (can you hear that pt is SOB on the phone)? No   2. How long have you been experiencing SOB?  For a while   3. Are you SOB when sitting or when up moving around? Moving around  4. Are you currently experiencing any other symptoms? Chest tightness pressure  Low energy.    Patient wants to be proactive if there is an issue and patient does not want to wait until something happens to act on it.  Please call to discuss conscerns.
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
## 2016-01-28 ENCOUNTER — Encounter: Payer: Self-pay | Admitting: Family

## 2016-01-28 ENCOUNTER — Encounter (INDEPENDENT_AMBULATORY_CARE_PROVIDER_SITE_OTHER): Payer: Self-pay

## 2016-01-28 ENCOUNTER — Ambulatory Visit (INDEPENDENT_AMBULATORY_CARE_PROVIDER_SITE_OTHER): Payer: 59 | Admitting: Family

## 2016-01-28 VITALS — BP 146/80 | HR 86 | Temp 97.7°F | Wt 178.2 lb

## 2016-01-28 DIAGNOSIS — E785 Hyperlipidemia, unspecified: Secondary | ICD-10-CM

## 2016-01-28 DIAGNOSIS — IMO0001 Reserved for inherently not codable concepts without codable children: Secondary | ICD-10-CM

## 2016-01-28 DIAGNOSIS — R030 Elevated blood-pressure reading, without diagnosis of hypertension: Secondary | ICD-10-CM

## 2016-01-28 DIAGNOSIS — I480 Paroxysmal atrial fibrillation: Secondary | ICD-10-CM

## 2016-01-28 DIAGNOSIS — Z0000 Encounter for general adult medical examination without abnormal findings: Secondary | ICD-10-CM

## 2016-01-28 MED ORDER — ATORVASTATIN CALCIUM 20 MG PO TABS: 20.0000 mg | ORAL_TABLET | Freq: Every day | ORAL | 3 refills | Status: DC

## 2016-01-28 NOTE — Assessment & Plan Note (Signed)
Stable.Follows with cardiology. On cardizem.
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
## 2016-01-28 NOTE — Assessment & Plan Note (Signed)
Stable, refilled.

## 2016-01-28 NOTE — Progress Notes (Signed)
Pre visit review using our clinic review tool, if applicable. No additional management support is needed unless otherwise documented below in the visit note. 

## 2016-01-28 NOTE — Assessment & Plan Note (Addendum)
BP today 146/80. Will continue to follow. No c/o palpitations today. I ordered screening labs today and will follow-up with physical and review labs in 2 months.

## 2016-01-28 NOTE — Addendum Note (Signed)
Addended by: Frutoso Chase A on: 01/28/2016 09:32 AM   Modules accepted: Orders

## 2016-01-30 ENCOUNTER — Other Ambulatory Visit (INDEPENDENT_AMBULATORY_CARE_PROVIDER_SITE_OTHER): Payer: 59

## 2016-01-30 DIAGNOSIS — E785 Hyperlipidemia, unspecified: Secondary | ICD-10-CM

## 2016-01-30 DIAGNOSIS — IMO0001 Reserved for inherently not codable concepts without codable children: Secondary | ICD-10-CM

## 2016-01-30 DIAGNOSIS — R030 Elevated blood-pressure reading, without diagnosis of hypertension: Secondary | ICD-10-CM | POA: Diagnosis not present

## 2016-01-30 DIAGNOSIS — Z0000 Encounter for general adult medical examination without abnormal findings: Secondary | ICD-10-CM

## 2016-01-30 DIAGNOSIS — I480 Paroxysmal atrial fibrillation: Secondary | ICD-10-CM | POA: Diagnosis not present

## 2016-01-30 LAB — COMPREHENSIVE METABOLIC PANEL
ALK PHOS: 64 U/L (ref 39–117)
ALT: 22 U/L (ref 0–53)
AST: 19 U/L (ref 0–37)
Albumin: 4.1 g/dL (ref 3.5–5.2)
BILIRUBIN TOTAL: 0.7 mg/dL (ref 0.2–1.2)
BUN: 16 mg/dL (ref 6–23)
CO2: 31 mEq/L (ref 19–32)
CREATININE: 1.3 mg/dL (ref 0.40–1.50)
Calcium: 9.5 mg/dL (ref 8.4–10.5)
Chloride: 104 mEq/L (ref 96–112)
GFR: 59.04 mL/min — ABNORMAL LOW (ref >60.00)
GLUCOSE: 100 mg/dL — AB (ref 70–99)
Potassium: 4.1 mEq/L (ref 3.5–5.1)
SODIUM: 142 meq/L (ref 135–145)
TOTAL PROTEIN: 6.9 g/dL (ref 6.0–8.3)

## 2016-01-30 LAB — CBC WITH DIFFERENTIAL/PLATELET
Basophils Absolute: 0 10*3/uL (ref 0.0–0.1)
Basophils Relative: 0.4 % (ref 0.0–3.0)
EOS ABS: 0.8 10*3/uL — AB (ref 0.0–0.7)
Eosinophils Relative: 9.7 % — ABNORMAL HIGH (ref 0.0–5.0)
HEMATOCRIT: 42.4 % (ref 39.0–52.0)
HEMOGLOBIN: 14.3 g/dL (ref 13.0–17.0)
LYMPHS PCT: 36.9 % (ref 12.0–46.0)
Lymphs Abs: 2.9 10*3/uL (ref 0.7–4.0)
MCHC: 33.8 g/dL (ref 30.0–36.0)
MCV: 94.6 fl (ref 78.0–100.0)
MONO ABS: 0.6 10*3/uL (ref 0.1–1.0)
Monocytes Relative: 8.2 % (ref 3.0–12.0)
Neutro Abs: 3.5 10*3/uL (ref 1.4–7.7)
Neutrophils Relative %: 44.8 % (ref 43.0–77.0)
Platelets: 231 10*3/uL (ref 150.0–400.0)
RBC: 4.48 Mil/uL (ref 4.22–5.81)
RDW: 13.8 % (ref 11.5–15.5)
WBC: 7.8 10*3/uL (ref 4.0–10.5)

## 2016-01-30 LAB — LIPID PANEL
Cholesterol: 181 mg/dL (ref 0–200)
HDL: 54.9 mg/dL (ref >39.00)
LDL Cholesterol: 98 mg/dL (ref 0–99)
NONHDL: 126.38
Total CHOL/HDL Ratio: 3
Triglycerides: 141 mg/dL (ref 0.0–149.0)
VLDL: 28.2 mg/dL (ref 0.0–40.0)

## 2016-01-30 LAB — VITAMIN D 25 HYDROXY (VIT D DEFICIENCY, FRACTURES): VITD: 59.07 ng/mL (ref 30.00–100.00)

## 2016-01-30 LAB — TSH: TSH: 2.53 u[IU]/mL (ref 0.35–4.50)

## 2016-01-30 LAB — PSA: PSA: 2.03 ng/mL (ref 0.10–4.00)

## 2016-01-30 LAB — HEMOGLOBIN A1C: HEMOGLOBIN A1C: 5.9 % (ref 4.6–6.5)

## 2016-01-31 LAB — HEPATITIS C ANTIBODY: HCV AB: NEGATIVE

## 2016-01-31 LAB — HIV ANTIBODY (ROUTINE TESTING W REFLEX): HIV 1&2 Ab, 4th Generation: NONREACTIVE

## 2016-03-31 ENCOUNTER — Encounter: Payer: Self-pay | Admitting: Family

## 2016-03-31 ENCOUNTER — Ambulatory Visit (INDEPENDENT_AMBULATORY_CARE_PROVIDER_SITE_OTHER): Payer: 59 | Admitting: Family

## 2016-03-31 VITALS — BP 144/68 | HR 64 | Temp 97.6°F | Ht 67.0 in | Wt 177.0 lb

## 2016-03-31 DIAGNOSIS — Z0000 Encounter for general adult medical examination without abnormal findings: Secondary | ICD-10-CM

## 2016-03-31 DIAGNOSIS — I6529 Occlusion and stenosis of unspecified carotid artery: Secondary | ICD-10-CM | POA: Insufficient documentation

## 2016-03-31 DIAGNOSIS — I6523 Occlusion and stenosis of bilateral carotid arteries: Secondary | ICD-10-CM
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
## 2016-03-31 NOTE — Assessment & Plan Note (Signed)
Asymptomatic. Lipids well controlled. Blood Pressure well controlled/ will repeat carotid Doppler.

## 2016-03-31 NOTE — Patient Instructions (Signed)
May need flomax in the future.   Health Maintenance, Male A healthy lifestyle and preventative care can promote health and wellness.  Maintain regular health, dental, and eye exams.  Eat a healthy diet. Foods like vegetables, fruits, whole grains, low-fat dairy products, and lean protein foods contain the nutrients you need and are low in calories. Decrease your intake of foods high in solid fats, added sugars, and salt. Get information about a proper diet from your health care provider, if necessary.  Regular physical exercise is one of the most important things you can do for your health. Most adults should get at least 150 minutes of moderate-intensity exercise (any activity that increases your heart rate and causes you to sweat) each week. In addition, most adults need muscle-strengthening exercises on 2 or more days a week.   Maintain a healthy weight. The body mass index (BMI) is a screening tool to identify possible weight problems. It provides an estimate of body fat based on height and weight. Your health care provider can find your BMI and can help you achieve or maintain a healthy weight. For males 20 years and older:  A BMI below 18.5 is considered underweight.  A BMI of 18.5 to 24.9 is normal.  A BMI of 25 to 29.9 is considered overweight.  A BMI of 30 and above is considered obese.  Maintain normal blood lipids and cholesterol by exercising and minimizing your intake of saturated fat. Eat a balanced diet with plenty of fruits and vegetables. Blood tests for lipids and cholesterol should begin at age 64 and be repeated every 5 years. If your lipid or cholesterol levels are high, you are over age 58, or you are at high risk for heart disease, you may need your cholesterol levels checked more frequently.Ongoing high lipid and cholesterol levels should be treated with medicines if diet and exercise are not working.  If you smoke, find out from your health care provider how to quit.  If you do not use tobacco, do not start.  Lung cancer screening is recommended for adults aged 57-80 years who are at high risk for developing lung cancer because of a history of smoking. A yearly low-dose CT scan of the lungs is recommended for people who have at least a 30-pack-year history of smoking and are current smokers or have quit within the past 15 years. A pack year of smoking is smoking an average of 1 pack of cigarettes a day for 1 year (for example, a 30-pack-year history of smoking could mean smoking 1 pack a day for 30 years or 2 packs a day for 15 years). Yearly screening should continue until the smoker has stopped smoking for at least 15 years. Yearly screening should be stopped for people who develop a health problem that would prevent them from having lung cancer treatment.  If you choose to drink alcohol, do not have more than 2 drinks per day. One drink is considered to be 12 oz (360 mL) of beer, 5 oz (150 mL) of wine, or 1.5 oz (45 mL) of liquor.  Avoid the use of street drugs. Do not share needles with anyone. Ask for help if you need support or instructions about stopping the use of drugs.  High blood pressure causes heart disease and increases the risk of stroke. High blood pressure is more likely to develop in:  People who have blood pressure in the end of the normal range (100-139/85-89 mm Hg).  People who are overweight or  obese.  People who are African American.  If you are 86-29 years of age, have your blood pressure checked every 3-5 years. If you are 69 years of age or older, have your blood pressure checked every year. You should have your blood pressure measured twice--once when you are at a hospital or clinic, and once when you are not at a hospital or clinic. Record the average of the two measurements. To check your blood pressure when you are not at a hospital or clinic, you can use:  An automated blood pressure machine at a pharmacy.  A home blood pressure  monitor.  If you are 82-13 years old, ask your health care provider if you should take aspirin to prevent heart disease.  Diabetes screening involves taking a blood sample to check your fasting blood sugar level. This should be done once every 3 years after age 66 if you are at a normal weight and without risk factors for diabetes. Testing should be considered at a younger age or be carried out more frequently if you are overweight and have at least 1 risk factor for diabetes.  Colorectal cancer can be detected and often prevented. Most routine colorectal cancer screening begins at the age of 39 and continues through age 70. However, your health care provider may recommend screening at an earlier age if you have risk factors for colon cancer. On a yearly basis, your health care provider may provide home test kits to check for hidden blood in the stool. A small camera at the end of a tube may be used to directly examine the colon (sigmoidoscopy or colonoscopy) to detect the earliest forms of colorectal cancer. Talk to your health care provider about this at age 22 when routine screening begins. A direct exam of the colon should be repeated every 5-10 years through age 7, unless early forms of precancerous polyps or small growths are found.  People who are at an increased risk for hepatitis B should be screened for this virus. You are considered at high risk for hepatitis B if:  You were born in a country where hepatitis B occurs often. Talk with your health care provider about which countries are considered high risk.  Your parents were born in a high-risk country and you have not received a shot to protect against hepatitis B (hepatitis B vaccine).  You have HIV or AIDS.  You use needles to inject street drugs.  You live with, or have sex with, someone who has hepatitis B.  You are a man who has sex with other men (MSM).  You get hemodialysis treatment.  You take certain medicines for  conditions like cancer, organ transplantation, and autoimmune conditions.  Hepatitis C blood testing is recommended for all people born from [birth year] through 1964 and any individual with known risk factors for hepatitis C.  Healthy men should no longer receive prostate-specific antigen (PSA) blood tests as part of routine cancer screening. Talk to your health care provider about prostate cancer screening.  Testicular cancer screening is not recommended for adolescents or adult males who have no symptoms. Screening includes self-exam, a health care provider exam, and other screening tests. Consult with your health care provider about any symptoms you have or any concerns you have about testicular cancer.  Practice safe sex. Use condoms and avoid high-risk sexual practices to reduce the spread of sexually transmitted infections (STIs).  You should be screened for STIs, including gonorrhea and chlamydia if:  You are sexually active  and are younger than 24 years.  You are older than 24 years, and your health care provider tells you that you are at risk for this type of infection.  Your sexual activity has changed since you were last screened, and you are at an increased risk for chlamydia or gonorrhea. Ask your health care provider if you are at risk.  If you are at risk of being infected with HIV, it is recommended that you take a prescription medicine daily to prevent HIV infection. This is called pre-exposure prophylaxis (PrEP). You are considered at risk if:  You are a man who has sex with other men (MSM).  You are a heterosexual man who is sexually active with multiple partners.  You take drugs by injection.  You are sexually active with a partner who has HIV.  Talk with your health care provider about whether you are at high risk of being infected with HIV. If you choose to begin PrEP, you should first be tested for HIV. You should then be tested every 3 months for as long as you are taking  PrEP.  Use sunscreen. Apply sunscreen liberally and repeatedly throughout the day. You should seek shade when your shadow is shorter than you. Protect yourself by wearing long sleeves, pants, a wide-brimmed hat, and sunglasses year round whenever you are outdoors.  Tell your health care provider of new moles or changes in moles, especially if there is a change in shape or color. Also, tell your health care provider if a mole is larger than the size of a pencil eraser.  A one-time screening for abdominal aortic aneurysm (AAA) and surgical repair of large AAAs by ultrasound is recommended for men aged 66-75 years who are current or former smokers.  Stay current with your vaccines (immunizations).   This information is not intended to replace advice given to you by your health care provider. Make sure you discuss any questions you have with your health care provider.   Document Released: 12/06/2007 Document Revised: 06/30/2014 Document Reviewed: 11/04/2010 Elsevier Interactive Patient Education [employer].

## 2016-03-31 NOTE — Assessment & Plan Note (Addendum)
Up-to-date on colonoscopy. Patient is due again in 4 years. PSA normal 01/2016. Patient I discussed my suspicion of enlarged prostate in the context of his urinary symptoms. Reassure by normal PSA and will continue to follow annually. He politely declined starting any medications such as Cialis which he had taken from his friend at this time since his symptoms are so intermittent. Patient had carotid Doppler 3 years ago and we will repeat.Up-to-date on immunizations. Screening labs done 01/2016. In need of annual skin check, referral to dermatology.Encouraged exercise.

## 2016-03-31 NOTE — Progress Notes (Signed)
Pre visit review using our clinic review tool, if applicable. No additional management support is needed unless otherwise documented below in the visit note. 

## 2016-04-10 ENCOUNTER — Telehealth: Payer: Self-pay | Admitting: Cardiovascular Disease

## 2016-04-10 NOTE — Telephone Encounter (Signed)
Patient feels like he is out of afib now and his hr is now 50's .  Patient scheduled with Ryan 04/11/16 at 230 pm.

## 2016-04-10 NOTE — Telephone Encounter (Signed)
Patient was due for fu in august and called to schedule appt with Arida.    Patient c/o that he had an episode of afib yesterday lasting approx. 4.5 hrs. Patient BP was high in the 150's and his HR was up to the 140's.  Patient said he feels like when he goes into afib his heart quits for a second.  Patient stated he has been revved up as daughter has been having some medical issues and she may have ms.  He says he took 2 diltiazem chewing the 2nd before swallowing, 6 baby aspirin.  Patient feels like he is now out

## 2016-04-11 ENCOUNTER — Encounter: Payer: Self-pay | Admitting: Physician Assistant

## 2016-04-11 ENCOUNTER — Ambulatory Visit (INDEPENDENT_AMBULATORY_CARE_PROVIDER_SITE_OTHER): Payer: 59 | Admitting: Physician Assistant

## 2016-04-11 ENCOUNTER — Emergency Department
Admission: EM | Admit: 2016-04-11 | Discharge: 2016-04-11 | Disposition: A | Discharge status: Home / Self Care | Payer: 59 | Attending: Student in an Organized Health Care Education/Training Program | Admitting: Student in an Organized Health Care Education/Training Program

## 2016-04-11 ENCOUNTER — Emergency Department: Payer: 59

## 2016-04-11 ENCOUNTER — Encounter: Payer: Self-pay | Admitting: Emergency Medicine

## 2016-04-11 VITALS — BP 150/82 | HR 76 | Ht 67.0 in | Wt 180.5 lb

## 2016-04-11 DIAGNOSIS — E782 Mixed hyperlipidemia: Secondary | ICD-10-CM

## 2016-04-11 DIAGNOSIS — R030 Elevated blood-pressure reading, without diagnosis of hypertension: Secondary | ICD-10-CM | POA: Diagnosis not present

## 2016-04-11 DIAGNOSIS — I480 Paroxysmal atrial fibrillation: Secondary | ICD-10-CM | POA: Diagnosis not present

## 2016-04-11 DIAGNOSIS — I10 Essential (primary) hypertension: Secondary | ICD-10-CM | POA: Diagnosis not present

## 2016-04-11 DIAGNOSIS — Z79899 Other long term (current) drug therapy: Secondary | ICD-10-CM | POA: Diagnosis not present

## 2016-04-11 DIAGNOSIS — R079 Chest pain, unspecified: Secondary | ICD-10-CM

## 2016-04-11 DIAGNOSIS — Z7982 Long term (current) use of aspirin: Secondary | ICD-10-CM | POA: Diagnosis not present

## 2016-04-11 DIAGNOSIS — R002 Palpitations: Secondary | ICD-10-CM

## 2016-04-11 DIAGNOSIS — R0789 Other chest pain: Secondary | ICD-10-CM | POA: Diagnosis present

## 2016-04-11 DIAGNOSIS — F439 Reaction to severe stress, unspecified: Secondary | ICD-10-CM

## 2016-04-11 LAB — BASIC METABOLIC PANEL
Anion gap: 7 (ref 5–15)
BUN: 14 mg/dL (ref 6–20)
CO2: 28 mmol/L (ref 22–32)
CREATININE: 1.24 mg/dL (ref 0.61–1.24)
Calcium: 9.4 mg/dL (ref 8.9–10.3)
Chloride: 104 mmol/L (ref 101–111)
GFR calc Af Amer: >60 mL/min (ref >60)
GFR, EST NON AFRICAN AMERICAN: 60 mL/min — AB (ref >60)
Glucose, Bld: 99 mg/dL (ref 65–99)
Potassium: 4.7 mmol/L (ref 3.5–5.1)
SODIUM: 139 mmol/L (ref 135–145)

## 2016-04-11 LAB — CBC
HCT: 44.6 % (ref 40.0–52.0)
Hemoglobin: 15.7 g/dL (ref 13.0–18.0)
MCH: 33 pg (ref 26.0–34.0)
MCHC: 35.2 g/dL (ref 32.0–36.0)
MCV: 93.7 fL (ref 80.0–100.0)
PLATELETS: 217 10*3/uL (ref 150–440)
RBC: 4.76 MIL/uL (ref 4.40–5.90)
RDW: 13.6 % (ref 11.5–14.5)
WBC: 8.6 10*3/uL (ref 3.8–10.6)

## 2016-04-11 LAB — TROPONIN I: Troponin I: <0.03 ng/mL (ref <0.03)

## 2016-04-11 LAB — FIBRIN DERIVATIVES D-DIMER (ARMC ONLY): FIBRIN DERIVATIVES D-DIMER (ARMC): 277 (ref 0–499)

## 2016-04-11 MED ORDER — PROMETHAZINE HCL 25 MG/ML IJ SOLN
12.5000 mg | Freq: Once | INTRAMUSCULAR | Status: AC
  Administered 2016-04-11: 12.5 mg via INTRAVENOUS
  Filled 2016-04-11: qty 1

## 2016-04-11 MED ORDER — NITROGLYCERIN 0.4 MG SL SUBL
0.4000 mg | SUBLINGUAL_TABLET | SUBLINGUAL | Status: DC | PRN
Start: 2016-04-11 — End: 2016-04-12
  Administered 2016-04-11: 0.4 mg via SUBLINGUAL
  Filled 2016-04-11: qty 1

## 2016-04-11 MED ORDER — MORPHINE SULFATE (PF) 2 MG/ML IV SOLN
4.0000 mg | INTRAVENOUS | Status: DC | PRN
  Filled 2016-04-11: qty 2

## 2016-04-11 MED ORDER — IOPAMIDOL (ISOVUE-370) INJECTION 76%
100.0000 mL | Freq: Once | INTRAVENOUS | Status: AC | PRN
  Administered 2016-04-11: 100 mL via INTRAVENOUS

## 2016-04-11 MED ORDER — MORPHINE SULFATE (PF) 2 MG/ML IV SOLN
2.0000 mg | INTRAVENOUS | Status: DC | PRN
  Administered 2016-04-11: 2 mg via INTRAVENOUS

## 2016-04-11 NOTE — ED Notes (Signed)
Patient transported to CT 
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
## 2016-04-11 NOTE — ED Triage Notes (Signed)
Presents with pain to mid chest   States pain started while at rest   Then had a short episode of numbness in face  Denies any diaphoresis or SOB

## 2016-04-12 ENCOUNTER — Other Ambulatory Visit: Payer: Self-pay | Admitting: Family

## 2016-04-14 ENCOUNTER — Telehealth: Payer: Self-pay | Admitting: Cardiovascular Disease

## 2016-04-14 NOTE — Telephone Encounter (Signed)
Lmov for patient  To call back and schedule ED fu from 04/11/16

## 2016-04-15 ENCOUNTER — Telehealth: Payer: Self-pay

## 2016-04-15 ENCOUNTER — Telehealth: Payer: Self-pay | Admitting: Cardiovascular Disease

## 2016-04-15 MED ORDER — ATORVASTATIN CALCIUM 20 MG PO TABS: 80.0000 mg | ORAL_TABLET | Freq: Every day | ORAL | 3 refills | Status: DC

## 2016-04-15 NOTE — Telephone Encounter (Signed)
Confirmed medication list has been updated.

## 2016-04-15 NOTE — Telephone Encounter (Signed)
Pt wanted to let us know his PCP has changed his Lipitor from 20 mg to 80 mg.

## 2016-04-15 NOTE — Telephone Encounter (Signed)
Medication (Lipitor) changed to 80MG .  Due to message from Burnard Hawthorne, FNP.  Medication changed due to ED visit.

## 2016-04-18 ENCOUNTER — Encounter: Payer: Self-pay | Admitting: Nurse Practitioner

## 2016-04-18 ENCOUNTER — Ambulatory Visit (INDEPENDENT_AMBULATORY_CARE_PROVIDER_SITE_OTHER): Payer: 59 | Admitting: Nurse Practitioner

## 2016-04-18 VITALS — BP 120/78 | HR 69 | Ht 69.0 in | Wt 181.5 lb

## 2016-04-18 DIAGNOSIS — E782 Mixed hyperlipidemia: Secondary | ICD-10-CM

## 2016-04-18 DIAGNOSIS — I10 Essential (primary) hypertension: Secondary | ICD-10-CM | POA: Diagnosis not present

## 2016-04-18 DIAGNOSIS — I480 Paroxysmal atrial fibrillation: Secondary | ICD-10-CM | POA: Diagnosis not present

## 2016-04-18 DIAGNOSIS — R079 Chest pain, unspecified: Secondary | ICD-10-CM | POA: Insufficient documentation

## 2016-04-18 NOTE — Patient Instructions (Addendum)
Medication Instructions:  Your physician recommends that you continue on your current medications as directed. Please refer to the Current Medication list given to you today.   Labwork: none  Testing/Procedures: Your physician has requested that you have a lexiscan myoview. For further information please visit [email or web address]. Please follow instruction sheet, as given.  [place]  Your caregiver has ordered a Stress Test with nuclear imaging. The purpose of this test is to evaluate the blood supply to your heart muscle. This procedure is referred to as a "Non-Invasive Stress Test." This is because other than having an IV started in your vein, nothing is inserted or "invades" your body. Cardiac stress tests are done to find areas of poor blood flow to the heart by determining the extent of coronary artery disease (CAD). Some patients exercise on a treadmill, which naturally increases the blood flow to your heart, while others who are  unable to walk on a treadmill due to physical limitations have a pharmacologic/chemical stress agent called Lexiscan . This medicine will mimic walking on a treadmill by temporarily increasing your coronary blood flow.   Please note: these test may take anywhere between 2-4 hours to complete  PLEASE REPORT TO [facility] AT THE FIRST DESK WILL DIRECT YOU WHERE TO GO  Date of Procedure:_Monday, November 20_  Arrival Time for Procedure:__8:15am___  Instructions regarding medication:   You may take your morning medications with a sip of water.   PLEASE NOTIFY THE OFFICE AT LEAST 27 HOURS IN ADVANCE IF YOU ARE UNABLE TO KEEP YOUR APPOINTMENT.  [phone] AND  PLEASE NOTIFY NUCLEAR MEDICINE AT [facility] AT LEAST 24 HOURS IN ADVANCE IF YOU ARE UNABLE TO KEEP YOUR APPOINTMENT. [phone]  How to prepare for your Myoview test:  1. Do not eat or drink after midnight 2. No caffeine for 24 hours prior to test 3. No smoking 24  hours prior to test. 4. Your medication may be taken with water.  If your doctor stopped a medication because of this test, do not take that medication. 5. Ladies, please do not wear dresses.  Skirts or pants are appropriate. Please wear a short sleeve shirt. 6. No perfume, cologne or lotion. 7. Wear comfortable walking shoes. No heels!            Follow-Up: Your physician wants you to follow-up in: 6 months with Dr. Fletcher Anon.  You will receive a reminder letter in the mail two months in advance. If you don't receive a letter, please call our office to schedule the follow-up appointment.   Any Other Special Instructions Will Be Listed Below (If Applicable).     If you need a refill on your cardiac medications before your next appointment, please call your pharmacy.  Cardiac Nuclear Scanning A cardiac nuclear scan is used to check your heart for problems, such as the following:  A portion of the heart is not getting enough blood.  Part of the heart muscle has died, which happens with a heart attack.  The heart wall is not working normally.  In this test, a radioactive dye (tracer) is injected into your bloodstream. After the tracer has traveled to your heart, a scanning device is used to measure how much of the tracer is absorbed by or distributed to various areas of your heart. LET [facility] CARE PROVIDER KNOW ABOUT:  Any allergies you have.  All medicines you are taking, including vitamins, herbs, eye drops, creams, and over-the-counter medicines.  Previous  problems you or members of your family have had with the use of anesthetics.  Any blood disorders you have.  Previous surgeries you have had.  Medical conditions you have.  RISKS AND COMPLICATIONS Generally, this is a safe procedure. However, as with any procedure, problems can occur. Possible problems include:   Serious chest pain.  Rapid heartbeat.  Sensation of warmth in your chest. This usually passes  quickly. BEFORE THE PROCEDURE Ask your health care provider about changing or stopping your regular medicines. PROCEDURE This procedure is usually done at a hospital and takes 2-4 hours.  An IV tube is inserted into one of your veins.  Your health care provider will inject a small amount of radioactive tracer through the tube.  You will then wait for 20-40 minutes while the tracer travels through your bloodstream.  You will lie down on an exam table so images of your heart can be taken. Images will be taken for about 15-20 minutes.  You will exercise on a treadmill or stationary bike. While you exercise, your heart activity will be monitored with an electrocardiogram (ECG), and your blood pressure will be checked.  If you are unable to exercise, you may be given a medicine to make your heart beat faster.  When blood flow to your heart has peaked, tracer will again be injected through the IV tube.  After 20-40 minutes, you will get back on the exam table and have more images taken of your heart.  When the procedure is over, your IV tube will be removed. AFTER THE PROCEDURE  You will likely be able to leave shortly after the test. Unless your health care provider tells you otherwise, you may return to your normal schedule, including diet, activities, and medicines.  Make sure you find out how and when you will get your test results.   This information is not intended to replace advice given to you by your health care provider. Make sure you discuss any questions you have with your health care provider.   Document Released: 07/04/2004 Document Revised: 06/14/2013 Document Reviewed: 05/18/2013 Elsevier Interactive Patient Education 2016 [employer]. Cardiac Nuclear Scanning A cardiac nuclear scan is used to check your heart for problems, such as the following:  A portion of the heart is not getting enough blood.  Part of the heart muscle has died, which happens with a heart  attack.  The heart wall is not working normally.  In this test, a radioactive dye (tracer) is injected into your bloodstream. After the tracer has traveled to your heart, a scanning device is used to measure how much of the tracer is absorbed by or distributed to various areas of your heart. LET [facility] CARE PROVIDER KNOW ABOUT:  Any allergies you have.  All medicines you are taking, including vitamins, herbs, eye drops, creams, and over-the-counter medicines.  Previous problems you or members of your family have had with the use of anesthetics.  Any blood disorders you have.  Previous surgeries you have had.  Medical conditions you have.  RISKS AND COMPLICATIONS Generally, this is a safe procedure. However, as with any procedure, problems can occur. Possible problems include:   Serious chest pain.  Rapid heartbeat.  Sensation of warmth in your chest. This usually passes quickly. BEFORE THE PROCEDURE Ask your health care provider about changing or stopping your regular medicines. PROCEDURE This procedure is usually done at a hospital and takes 2-4 hours.  An IV tube is inserted into one of your  veins.  Your health care provider will inject a small amount of radioactive tracer through the tube.  You will then wait for 20-40 minutes while the tracer travels through your bloodstream.  You will lie down on an exam table so images of your heart can be taken. Images will be taken for about 15-20 minutes.  You will exercise on a treadmill or stationary bike. While you exercise, your heart activity will be monitored with an electrocardiogram (ECG), and your blood pressure will be checked.  If you are unable to exercise, you may be given a medicine to make your heart beat faster.  When blood flow to your heart has peaked, tracer will again be injected through the IV tube.  After 20-40 minutes, you will get back on the exam table and have more images taken of your  heart.  When the procedure is over, your IV tube will be removed. AFTER THE PROCEDURE  You will likely be able to leave shortly after the test. Unless your health care provider tells you otherwise, you may return to your normal schedule, including diet, activities, and medicines.  Make sure you find out how and when you will get your test results.   This information is not intended to replace advice given to you by your health care provider. Make sure you discuss any questions you have with your health care provider.   Document Released: 07/04/2004 Document Revised: 06/14/2013 Document Reviewed: 05/18/2013 Elsevier Interactive Patient Education [employer].
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
## 2016-04-20 ENCOUNTER — Other Ambulatory Visit: Payer: Self-pay | Admitting: Family

## 2016-04-20 DIAGNOSIS — E785 Hyperlipidemia, unspecified: Secondary | ICD-10-CM

## 2016-04-20 MED ORDER — ATORVASTATIN CALCIUM 80 MG PO TABS: 80.0000 mg | ORAL_TABLET | Freq: Every day | ORAL | 3 refills | Status: DC

## 2016-04-21 ENCOUNTER — Telehealth: Payer: Self-pay | Admitting: Cardiovascular Disease

## 2016-04-21 NOTE — Telephone Encounter (Signed)
Pt calling asking if we can reschedule his Myoview on 05/12/16 Please call back

## 2016-04-21 NOTE — Telephone Encounter (Signed)
Pt rescheduled myoview. New date, November 22.

## 2016-05-13 ENCOUNTER — Telehealth: Payer: Self-pay | Admitting: Nurse Practitioner

## 2016-05-13 NOTE — Telephone Encounter (Signed)
Reviewed myoview instructions w/pt who verbalized understanding.  

## 2016-05-14 ENCOUNTER — Encounter
Admission: RE | Admit: 2016-05-14 | Discharge: 2016-05-14 | Disposition: A | Discharge status: Home / Self Care | Payer: 59 | Source: Ambulatory Visit | Attending: Nurse Practitioner | Admitting: Nurse Practitioner

## 2016-05-14 DIAGNOSIS — R079 Chest pain, unspecified: Secondary | ICD-10-CM | POA: Insufficient documentation

## 2016-05-14 LAB — NM MYOCAR MULTI W/SPECT W/WALL MOTION / EF
CHL CUP NUCLEAR SDS: 0
CHL CUP NUCLEAR SRS: 0
CSEPEDS: 15 s
CSEPHR: 83 %
CSEPPHR: 130 {beats}/min
Estimated workload: 7 METS
Exercise duration (min): 6 min
LVDIAVOL: 47 mL (ref 62–150)
LVSYSVOL: 9 mL
NUC STRESS TID: 0.79
Rest HR: 67 {beats}/min
SSS: 0

## 2016-05-14 MED ORDER — TECHNETIUM TC 99M TETROFOSMIN IV KIT
33.0000 | PACK | Freq: Once | INTRAVENOUS | Status: AC | PRN
  Administered 2016-05-14: 29.191 via INTRAVENOUS

## 2016-05-14 MED ORDER — TECHNETIUM TC 99M TETROFOSMIN IV KIT
13.0000 | PACK | Freq: Once | INTRAVENOUS | Status: AC | PRN
  Administered 2016-05-14: 12.357 via INTRAVENOUS

## 2016-09-08 ENCOUNTER — Other Ambulatory Visit: Payer: Self-pay

## 2016-09-08 MED ORDER — DILTIAZEM HCL ER COATED BEADS 120 MG PO CP24: 120.0000 mg | ORAL_CAPSULE | Freq: Every day | ORAL | 0 refills | Status: DC

## 2016-09-08 NOTE — Telephone Encounter (Signed)
Refill sent for Diltiazem 120 mg take one tablet daily.

## 2016-09-09 ENCOUNTER — Other Ambulatory Visit: Payer: Self-pay | Admitting: Cardiovascular Disease

## 2016-09-09 MED ORDER — DILTIAZEM HCL ER COATED BEADS 120 MG PO CP24: 120.0000 mg | ORAL_CAPSULE | Freq: Every day | ORAL | 3 refills | Status: DC

## 2016-09-10 ENCOUNTER — Telehealth: Payer: Self-pay

## 2016-09-10 NOTE — Telephone Encounter (Signed)
l mom to schedule April appointment.

## 2016-09-10 NOTE — Telephone Encounter (Signed)
-----   Message from Kittie Plater, LPN sent at [phone]  5:12 PM EDT ----- Pt needs f/u appt for April. Thank you.

## 2016-10-06 NOTE — Telephone Encounter (Signed)
Pt is coming in on 10/27/16

## 2016-10-27 ENCOUNTER — Ambulatory Visit (INDEPENDENT_AMBULATORY_CARE_PROVIDER_SITE_OTHER): Payer: 59 | Admitting: Cardiovascular Disease

## 2016-10-27 ENCOUNTER — Encounter: Payer: Self-pay | Admitting: Cardiovascular Disease

## 2016-10-27 ENCOUNTER — Ambulatory Visit: Payer: 59 | Admitting: Cardiovascular Disease

## 2016-10-27 VITALS — BP 146/86 | HR 68 | Ht 69.0 in | Wt 180.5 lb

## 2016-10-27 DIAGNOSIS — I480 Paroxysmal atrial fibrillation: Secondary | ICD-10-CM | POA: Diagnosis not present

## 2016-10-27 DIAGNOSIS — I471 Supraventricular tachycardia: Secondary | ICD-10-CM

## 2016-10-27 DIAGNOSIS — I10 Essential (primary) hypertension: Secondary | ICD-10-CM | POA: Diagnosis not present

## 2016-10-27 DIAGNOSIS — E785 Hyperlipidemia, unspecified: Secondary | ICD-10-CM | POA: Diagnosis not present

## 2016-10-27 DIAGNOSIS — I6523 Occlusion and stenosis of bilateral carotid arteries: Secondary | ICD-10-CM

## 2016-10-27 MED ORDER — DILTIAZEM HCL ER COATED BEADS 180 MG PO CP24: 180.0000 mg | ORAL_CAPSULE | Freq: Every day | ORAL | 5 refills | Status: DC

## 2016-10-27 NOTE — Patient Instructions (Signed)
Medication Instructions:  Your physician has recommended you make the following change in your medication:  INCREASE diltiazem to 180mg  once daily   Labwork: none  Testing/Procedures: Your physician has requested that you have a carotid duplex. This test is an ultrasound of the carotid arteries in your neck. It looks at blood flow through these arteries that supply the brain with blood. Allow one hour for this exam. There are no restrictions or special instructions.    Follow-Up: Your physician wants you to follow-up in: 6 months with Dr. Fletcher Anon.  You will receive a reminder letter in the mail two months in advance. If you don't receive a letter, please call our office to schedule the follow-up appointment.   Any Other Special Instructions Will Be Listed Below (If Applicable).     If you need a refill on your cardiac medications before your next appointment, please call your pharmacy.
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
## 2016-11-28 ENCOUNTER — Ambulatory Visit: Payer: Medicare Other

## 2016-11-28 DIAGNOSIS — I6523 Occlusion and stenosis of bilateral carotid arteries: Secondary | ICD-10-CM | POA: Diagnosis not present

## 2016-11-28 LAB — VAS US CAROTID
LCCADDIAS: -32 cm/s
LCCADSYS: -120 cm/s
LEFT ECA DIAS: -17 cm/s
LEFT VERTEBRAL DIAS: 11 cm/s
LICADDIAS: -35 cm/s
LICADSYS: -96 cm/s
LICAPDIAS: -10 cm/s
LICAPSYS: -40 cm/s
Left CCA prox dias: 27 cm/s
Left CCA prox sys: 128 cm/s
RCCADSYS: -80 cm/s
RIGHT ECA DIAS: 0 cm/s
RIGHT VERTEBRAL DIAS: -14 cm/s
Right CCA prox dias: -16 cm/s
Right CCA prox sys: -83 cm/s

## 2016-12-01 ENCOUNTER — Other Ambulatory Visit: Payer: Self-pay

## 2016-12-01 DIAGNOSIS — I739 Peripheral vascular disease, unspecified: Principal | ICD-10-CM

## 2016-12-01 DIAGNOSIS — I779 Disorder of arteries and arterioles, unspecified: Secondary | ICD-10-CM

## 2016-12-01 NOTE — Progress Notes (Unsigned)
as

## 2016-12-23 ENCOUNTER — Ambulatory Visit: Payer: 59 | Admitting: Cardiovascular Disease

## 2017-02-03 ENCOUNTER — Emergency Department: Payer: Medicare Other

## 2017-02-03 ENCOUNTER — Emergency Department
Admission: EM | Admit: 2017-02-03 | Discharge: 2017-02-03 | Disposition: A | Discharge status: Home / Self Care | Payer: Medicare Other | Attending: Emergency Medicine | Admitting: Emergency Medicine

## 2017-02-03 ENCOUNTER — Encounter: Payer: Self-pay | Admitting: Emergency Medicine

## 2017-02-03 ENCOUNTER — Telehealth: Payer: Self-pay | Admitting: Family

## 2017-02-03 DIAGNOSIS — I10 Essential (primary) hypertension: Secondary | ICD-10-CM | POA: Diagnosis not present

## 2017-02-03 DIAGNOSIS — R0602 Shortness of breath: Secondary | ICD-10-CM | POA: Diagnosis not present

## 2017-02-03 DIAGNOSIS — Z7982 Long term (current) use of aspirin: Secondary | ICD-10-CM | POA: Diagnosis not present

## 2017-02-03 DIAGNOSIS — I480 Paroxysmal atrial fibrillation: Secondary | ICD-10-CM | POA: Diagnosis not present

## 2017-02-03 DIAGNOSIS — Z79899 Other long term (current) drug therapy: Secondary | ICD-10-CM | POA: Diagnosis not present

## 2017-02-03 DIAGNOSIS — J302 Other seasonal allergic rhinitis: Secondary | ICD-10-CM | POA: Insufficient documentation

## 2017-02-03 LAB — CBC
HEMATOCRIT: 43.9 % (ref 40.0–52.0)
Hemoglobin: 15 g/dL (ref 13.0–18.0)
MCH: 31.8 pg (ref 26.0–34.0)
MCHC: 34.1 g/dL (ref 32.0–36.0)
MCV: 93.1 fL (ref 80.0–100.0)
Platelets: 231 10*3/uL (ref 150–440)
RBC: 4.72 MIL/uL (ref 4.40–5.90)
RDW: 13.8 % (ref 11.5–14.5)
WBC: 9.4 10*3/uL (ref 3.8–10.6)

## 2017-02-03 LAB — BASIC METABOLIC PANEL
Anion gap: 9 (ref 5–15)
BUN: 18 mg/dL (ref 6–20)
CO2: 24 mmol/L (ref 22–32)
Calcium: 9.2 mg/dL (ref 8.9–10.3)
Chloride: 107 mmol/L (ref 101–111)
Creatinine, Ser: 1.29 mg/dL — ABNORMAL HIGH (ref 0.61–1.24)
GFR calc Af Amer: >60 mL/min (ref >60)
GFR, EST NON AFRICAN AMERICAN: 57 mL/min — AB (ref >60)
GLUCOSE: 140 mg/dL — AB (ref 65–99)
POTASSIUM: 4.1 mmol/L (ref 3.5–5.1)
Sodium: 140 mmol/L (ref 135–145)

## 2017-02-03 LAB — TROPONIN I: Troponin I: <0.03 ng/mL (ref <0.03)

## 2017-02-03 MED ORDER — LEVALBUTEROL TARTRATE 45 MCG/ACT IN AERO: 1.0000 | INHALATION_SPRAY | RESPIRATORY_TRACT | 0 refills | Status: DC | PRN

## 2017-02-03 MED ORDER — ALBUTEROL SULFATE (2.5 MG/3ML) 0.083% IN NEBU: 5.0000 mg | INHALATION_SOLUTION | Freq: Once | RESPIRATORY_TRACT | Status: DC
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
## 2017-02-03 NOTE — Telephone Encounter (Signed)
Patient Name: Dalton Sanders  DOB: 05/29/1951    Initial Comment Caller is having trouble breathing Dr. Vidal Schwalbe    Nurse Assessment  Nurse: Raphael Gibney, RN, Vanita Ingles Date/Time Eilene Ghazi Time): 02/03/2017 2:17:06 PM  Confirm and document reason for call. If symptomatic, describe symptoms. ---Caller states he is having trouble breathing. He has a history of allergies. History of A fib. Takes allergy medication. No cough or congestion.  Does the patient have any new or worsening symptoms? ---Yes  Will a triage be completed? ---Yes  Related visit to physician within the last 2 weeks? ---No  Does the PT have any chronic conditions? (i.e. diabetes, asthma, etc.) ---Yes  List chronic conditions. ---Afib  Is this a behavioral health or substance abuse call? ---No     Guidelines    Guideline Title Affirmed Question Affirmed Notes  Breathing Difficulty [1] MILD difficulty breathing (e.g., minimal/no SOB at rest, SOB with walking, pulse <100) AND [2] NEW-onset or WORSE than normal    Final Disposition User   See Physician within 4 Hours (or PCP triage) Raphael Gibney, RN, Vera    Comments  pt states he can not come into the office today for an appt. Wants to know if something can be called in. Please call pt back regarding medication.   Referrals  GO TO FACILITY REFUSED   Disagree/Comply: Disagree  Disagree/Comply Reason: Disagree with instructions

## 2017-02-03 NOTE — Discharge Instructions (Signed)
Continue using your cetirizine daily. Use Afrin nasal spray and/or sterile saline irrigation (neti pot) to improve your allergy symptoms.  Take xopenex as needed for shortness of breath related to your allergies. If you have palpitations, racing heart beat, dizziness, chest pain, or worsening shortness of breath, stop using xopenex and call seek medical attention.   Results for orders placed or performed during the hospital encounter of 16/10/96  Basic metabolic panel  Result Value Ref Range   Sodium 140 135 - 145 mmol/L   Potassium 4.1 3.5 - 5.1 mmol/L   Chloride 107 101 - 111 mmol/L   CO2 24 22 - 32 mmol/L   Glucose, Bld 140 (H) 65 - 99 mg/dL   BUN 18 6 - 20 mg/dL   Creatinine, Ser 1.29 (H) 0.61 - 1.24 mg/dL   Calcium 9.2 8.9 - 10.3 mg/dL   GFR calc non Af Amer 57 (L) >60 mL/min   GFR calc Af Amer >60 >60 mL/min   Anion gap 9 5 - 15  CBC  Result Value Ref Range   WBC 9.4 3.8 - 10.6 K/uL   RBC 4.72 4.40 - 5.90 MIL/uL   Hemoglobin 15.0 13.0 - 18.0 g/dL   HCT 43.9 40.0 - 52.0 %   MCV 93.1 80.0 - 100.0 fL   MCH 31.8 26.0 - 34.0 pg   MCHC 34.1 32.0 - 36.0 g/dL   RDW 13.8 11.5 - 14.5 %   Platelets 231 150 - 440 K/uL  Troponin I  Result Value Ref Range   Troponin I <0.03 <0.03 ng/mL   Dg Chest 2 View  Result Date: 02/03/2017 CLINICAL DATA:  Shortness of Breath EXAM: CHEST  2 VIEW COMPARISON:  04/11/2016 FINDINGS: Heart and mediastinal contours are within normal limits. No focal opacities or effusions. No acute bony abnormality. IMPRESSION: No active cardiopulmonary disease. Electronically Signed   By: Rolm Baptise M.D.   On: 02/03/2017 18:12

## 2017-02-03 NOTE — ED Triage Notes (Addendum)
Pt to ED with c/o increased SOB that started today, pt has hx of a-fib. PT ambulatory, speaking in full sentences. Pt states slight chest pressure. VS stable.
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
## 2017-02-03 NOTE — Telephone Encounter (Signed)
Patient calling in stating that he has been having trouble breathing. Blood pressure 135/70 with heart rate of 65. He states that certain smells can cause him to have breathing problems. Patients heart rate is currently 66 and he states that he just is having a hard time breathing. He went to pharmacy to see if he could get something to help his breathing and they were not able to help him. He states that his nose feels as if it is closed up and he is just not able to breathe well. Advised him to seek evaluation in the ED or urgent care center to further review his current situation. Let him know that he really needs to be evaluated and that he should really follow PCP advice as well. He was agreeable with that plan and had no further questions at this time.

## 2017-02-03 NOTE — Telephone Encounter (Signed)
fyi

## 2017-02-03 NOTE — Telephone Encounter (Signed)
Noted! Thank you

## 2017-03-05 DIAGNOSIS — K219 Gastro-esophageal reflux disease without esophagitis: Secondary | ICD-10-CM | POA: Diagnosis not present

## 2017-03-05 DIAGNOSIS — J301 Allergic rhinitis due to pollen: Secondary | ICD-10-CM | POA: Diagnosis not present

## 2017-03-05 DIAGNOSIS — M950 Acquired deformity of nose: Secondary | ICD-10-CM | POA: Diagnosis not present

## 2017-03-05 DIAGNOSIS — J3489 Other specified disorders of nose and nasal sinuses: Secondary | ICD-10-CM | POA: Diagnosis not present

## 2017-03-05 DIAGNOSIS — G4733 Obstructive sleep apnea (adult) (pediatric): Secondary | ICD-10-CM | POA: Diagnosis not present

## 2017-03-16 DIAGNOSIS — D0462 Carcinoma in situ of skin of left upper limb, including shoulder: Secondary | ICD-10-CM | POA: Diagnosis not present

## 2017-03-16 DIAGNOSIS — X32XXXA Exposure to sunlight, initial encounter: Secondary | ICD-10-CM | POA: Diagnosis not present

## 2017-03-16 DIAGNOSIS — Z85828 Personal history of other malignant neoplasm of skin: Secondary | ICD-10-CM | POA: Diagnosis not present

## 2017-03-16 DIAGNOSIS — L570 Actinic keratosis: Secondary | ICD-10-CM | POA: Diagnosis not present

## 2017-03-16 DIAGNOSIS — D485 Neoplasm of uncertain behavior of skin: Secondary | ICD-10-CM | POA: Diagnosis not present

## 2017-03-19 ENCOUNTER — Emergency Department: Payer: Medicare Other

## 2017-03-19 ENCOUNTER — Observation Stay
Admission: EM | Admit: 2017-03-19 | Discharge: 2017-03-20 | Disposition: A | Discharge status: Home / Self Care | Payer: Medicare Other | Attending: Internal Medicine | Admitting: Internal Medicine

## 2017-03-19 DIAGNOSIS — Z7982 Long term (current) use of aspirin: Secondary | ICD-10-CM | POA: Insufficient documentation

## 2017-03-19 DIAGNOSIS — I482 Chronic atrial fibrillation: Secondary | ICD-10-CM | POA: Diagnosis not present

## 2017-03-19 DIAGNOSIS — Z833 Family history of diabetes mellitus: Secondary | ICD-10-CM | POA: Insufficient documentation

## 2017-03-19 DIAGNOSIS — R0602 Shortness of breath: Secondary | ICD-10-CM | POA: Insufficient documentation

## 2017-03-19 DIAGNOSIS — I4891 Unspecified atrial fibrillation: Secondary | ICD-10-CM | POA: Diagnosis not present

## 2017-03-19 DIAGNOSIS — I10 Essential (primary) hypertension: Secondary | ICD-10-CM | POA: Diagnosis not present

## 2017-03-19 DIAGNOSIS — I200 Unstable angina: Secondary | ICD-10-CM

## 2017-03-19 DIAGNOSIS — R002 Palpitations: Secondary | ICD-10-CM | POA: Insufficient documentation

## 2017-03-19 DIAGNOSIS — Z841 Family history of disorders of kidney and ureter: Secondary | ICD-10-CM | POA: Diagnosis not present

## 2017-03-19 DIAGNOSIS — Z8261 Family history of arthritis: Secondary | ICD-10-CM | POA: Diagnosis not present

## 2017-03-19 DIAGNOSIS — I6529 Occlusion and stenosis of unspecified carotid artery: Secondary | ICD-10-CM | POA: Insufficient documentation

## 2017-03-19 DIAGNOSIS — R531 Weakness: Secondary | ICD-10-CM | POA: Diagnosis not present

## 2017-03-19 DIAGNOSIS — Z888 Allergy status to other drugs, medicaments and biological substances status: Secondary | ICD-10-CM | POA: Diagnosis not present

## 2017-03-19 DIAGNOSIS — R079 Chest pain, unspecified: Secondary | ICD-10-CM | POA: Diagnosis not present

## 2017-03-19 DIAGNOSIS — I480 Paroxysmal atrial fibrillation: Secondary | ICD-10-CM | POA: Diagnosis not present

## 2017-03-19 DIAGNOSIS — K219 Gastro-esophageal reflux disease without esophagitis: Secondary | ICD-10-CM | POA: Diagnosis not present

## 2017-03-19 DIAGNOSIS — Z9889 Other specified postprocedural states: Secondary | ICD-10-CM | POA: Diagnosis not present

## 2017-03-19 DIAGNOSIS — E876 Hypokalemia: Secondary | ICD-10-CM | POA: Diagnosis present

## 2017-03-19 DIAGNOSIS — E785 Hyperlipidemia, unspecified: Secondary | ICD-10-CM | POA: Insufficient documentation

## 2017-03-19 DIAGNOSIS — M170 Bilateral primary osteoarthritis of knee: Secondary | ICD-10-CM | POA: Diagnosis not present

## 2017-03-19 DIAGNOSIS — I214 Non-ST elevation (NSTEMI) myocardial infarction: Secondary | ICD-10-CM

## 2017-03-19 DIAGNOSIS — Z823 Family history of stroke: Secondary | ICD-10-CM | POA: Insufficient documentation

## 2017-03-19 DIAGNOSIS — R0789 Other chest pain: Principal | ICD-10-CM | POA: Insufficient documentation

## 2017-03-19 DIAGNOSIS — Z91013 Allergy to seafood: Secondary | ICD-10-CM | POA: Diagnosis not present

## 2017-03-19 DIAGNOSIS — Z91041 Radiographic dye allergy status: Secondary | ICD-10-CM | POA: Insufficient documentation

## 2017-03-19 DIAGNOSIS — Z79899 Other long term (current) drug therapy: Secondary | ICD-10-CM | POA: Insufficient documentation

## 2017-03-19 DIAGNOSIS — R000 Tachycardia, unspecified: Secondary | ICD-10-CM | POA: Diagnosis not present

## 2017-03-19 LAB — BASIC METABOLIC PANEL
Anion gap: 13 (ref 5–15)
BUN: 14 mg/dL (ref 6–20)
CALCIUM: 8.9 mg/dL (ref 8.9–10.3)
CO2: 21 mmol/L — ABNORMAL LOW (ref 22–32)
CREATININE: 1.12 mg/dL (ref 0.61–1.24)
Chloride: 104 mmol/L (ref 101–111)
Glucose, Bld: 116 mg/dL — ABNORMAL HIGH (ref 65–99)
Potassium: 3.1 mmol/L — ABNORMAL LOW (ref 3.5–5.1)
SODIUM: 138 mmol/L (ref 135–145)

## 2017-03-19 LAB — TSH: TSH: 1.74 u[IU]/mL (ref 0.350–4.500)

## 2017-03-19 LAB — CBC
HCT: 44.7 % (ref 40.0–52.0)
Hemoglobin: 15.7 g/dL (ref 13.0–18.0)
MCH: 32.7 pg (ref 26.0–34.0)
MCHC: 35.2 g/dL (ref 32.0–36.0)
MCV: 93 fL (ref 80.0–100.0)
Platelets: 222 10*3/uL (ref 150–440)
RBC: 4.8 MIL/uL (ref 4.40–5.90)
RDW: 13.7 % (ref 11.5–14.5)
WBC: 9.2 10*3/uL (ref 3.8–10.6)

## 2017-03-19 LAB — TROPONIN I: Troponin I: <0.03 ng/mL (ref <0.03)

## 2017-03-19 LAB — PROTIME-INR
INR: 1.04
PROTHROMBIN TIME: 13.5 s (ref 11.4–15.2)

## 2017-03-19 LAB — APTT

## 2017-03-19 MED ORDER — ACETAMINOPHEN 325 MG PO TABS: 650.0000 mg | ORAL_TABLET | Freq: Four times a day (QID) | ORAL | Status: DC | PRN

## 2017-03-19 MED ORDER — HEPARIN (PORCINE) IN NACL 100-0.45 UNIT/ML-% IJ SOLN
900.0000 [IU]/h | INTRAMUSCULAR | Status: DC
  Administered 2017-03-19: 1000 [IU]/h via INTRAVENOUS
  Filled 2017-03-19: qty 250

## 2017-03-19 MED ORDER — ONDANSETRON HCL 4 MG PO TABS: 4.0000 mg | ORAL_TABLET | Freq: Four times a day (QID) | ORAL | Status: DC | PRN

## 2017-03-19 MED ORDER — SODIUM CHLORIDE 0.9 % IV SOLN
INTRAVENOUS | Status: DC
  Administered 2017-03-20 (×2): via INTRAVENOUS

## 2017-03-19 MED ORDER — SODIUM CHLORIDE 0.9% FLUSH: 3.0000 mL | INTRAVENOUS | Status: DC | PRN

## 2017-03-19 MED ORDER — METOPROLOL TARTRATE 5 MG/5ML IV SOLN
10.0000 mg | Freq: Once | INTRAVENOUS | Status: AC
  Administered 2017-03-19: 10 mg via INTRAVENOUS
  Filled 2017-03-19: qty 10

## 2017-03-19 MED ORDER — ASPIRIN 81 MG PO CHEW
81.0000 mg | CHEWABLE_TABLET | ORAL | Status: AC
  Administered 2017-03-20: 81 mg via ORAL
  Filled 2017-03-19: qty 1

## 2017-03-19 MED ORDER — ASPIRIN EC 81 MG PO TBEC
81.0000 mg | DELAYED_RELEASE_TABLET | Freq: Two times a day (BID) | ORAL | Status: DC
  Administered 2017-03-19: 81 mg via ORAL
  Filled 2017-03-19: qty 1

## 2017-03-19 MED ORDER — ACETAMINOPHEN 650 MG RE SUPP: 650.0000 mg | Freq: Four times a day (QID) | RECTAL | Status: DC | PRN

## 2017-03-19 MED ORDER — LORATADINE 10 MG PO TABS
10.0000 mg | ORAL_TABLET | Freq: Every day | ORAL | Status: DC
  Administered 2017-03-20: 10 mg via ORAL
  Filled 2017-03-19: qty 1

## 2017-03-19 MED ORDER — SODIUM CHLORIDE 0.9 % IV SOLN: 250.0000 mL | INTRAVENOUS | Status: DC | PRN

## 2017-03-19 MED ORDER — ENOXAPARIN SODIUM 40 MG/0.4ML ~~LOC~~ SOLN: 40.0000 mg | SUBCUTANEOUS | Status: DC

## 2017-03-19 MED ORDER — ATORVASTATIN CALCIUM 20 MG PO TABS
80.0000 mg | ORAL_TABLET | Freq: Every day | ORAL | Status: DC
  Administered 2017-03-19: 80 mg via ORAL
  Filled 2017-03-19: qty 4

## 2017-03-19 MED ORDER — HEPARIN SODIUM (PORCINE) 5000 UNIT/ML IJ SOLN
4000.0000 [IU] | Freq: Once | INTRAMUSCULAR | Status: AC
  Administered 2017-03-19: 4000 [IU] via INTRAVENOUS
  Filled 2017-03-19: qty 1

## 2017-03-19 MED ORDER — NIACIN 500 MG PO TABS
500.0000 mg | ORAL_TABLET | Freq: Every day | ORAL | Status: DC
  Administered 2017-03-19: 500 mg via ORAL
  Filled 2017-03-19 (×2): qty 1

## 2017-03-19 MED ORDER — DILTIAZEM HCL ER COATED BEADS 180 MG PO CP24: 180.0000 mg | ORAL_CAPSULE | Freq: Every day | ORAL | Status: DC

## 2017-03-19 MED ORDER — PANTOPRAZOLE SODIUM 40 MG PO TBEC
40.0000 mg | DELAYED_RELEASE_TABLET | Freq: Every day | ORAL | Status: DC
  Administered 2017-03-20: 40 mg via ORAL
  Filled 2017-03-19: qty 1

## 2017-03-19 MED ORDER — VITAMIN D 1000 UNITS PO TABS
2000.0000 [IU] | ORAL_TABLET | Freq: Every day | ORAL | Status: DC
  Administered 2017-03-20: 2000 [IU] via ORAL
  Filled 2017-03-19: qty 2

## 2017-03-19 MED ORDER — ALBUTEROL SULFATE (2.5 MG/3ML) 0.083% IN NEBU: 2.5000 mg | INHALATION_SOLUTION | RESPIRATORY_TRACT | Status: DC | PRN

## 2017-03-19 MED ORDER — METOPROLOL TARTRATE 5 MG/5ML IV SOLN
INTRAVENOUS | Status: AC
  Filled 2017-03-19: qty 10

## 2017-03-19 MED ORDER — SODIUM CHLORIDE 0.9% FLUSH: 3.0000 mL | Freq: Two times a day (BID) | INTRAVENOUS | Status: DC

## 2017-03-19 MED ORDER — ONDANSETRON HCL 4 MG/2ML IJ SOLN: 4.0000 mg | Freq: Four times a day (QID) | INTRAMUSCULAR | Status: DC | PRN

## 2017-03-19 MED ORDER — POTASSIUM CHLORIDE CRYS ER 20 MEQ PO TBCR
40.0000 meq | EXTENDED_RELEASE_TABLET | Freq: Once | ORAL | Status: AC
  Administered 2017-03-19: 40 meq via ORAL
  Filled 2017-03-19: qty 2
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
## 2017-03-19 NOTE — ED Triage Notes (Signed)
Patient brought via [facility] for chest pain and a-fib RVR.  Patient with 5 year history of a-fib.  Per PCP patient to take Cardizem 30 mg PO.  Patient does not report doing anything in particular when heart starting racing and chest pain developed.  Patient took Cardizem 30mg  PO at 13:00 and chewed up two aspirin 325mg  PO.  When issue did not resolved patient called EMS.  EMS started IV and administered 16mg  of Cardizem IV.

## 2017-03-19 NOTE — ED Notes (Signed)
Cardiologist at bedside.  

## 2017-03-20 ENCOUNTER — Encounter: Payer: Self-pay | Admitting: Cardiovascular Disease

## 2017-03-20 ENCOUNTER — Encounter
Admission: EM | Disposition: A | Discharge status: Home / Self Care | Payer: Self-pay | Source: Home / Self Care | Attending: Emergency Medicine

## 2017-03-20 DIAGNOSIS — I480 Paroxysmal atrial fibrillation: Secondary | ICD-10-CM | POA: Diagnosis not present

## 2017-03-20 DIAGNOSIS — E785 Hyperlipidemia, unspecified: Secondary | ICD-10-CM | POA: Diagnosis not present

## 2017-03-20 DIAGNOSIS — I200 Unstable angina: Secondary | ICD-10-CM | POA: Diagnosis not present

## 2017-03-20 DIAGNOSIS — R0789 Other chest pain: Secondary | ICD-10-CM | POA: Diagnosis not present

## 2017-03-20 DIAGNOSIS — K219 Gastro-esophageal reflux disease without esophagitis: Secondary | ICD-10-CM | POA: Diagnosis not present

## 2017-03-20 HISTORY — PX: LEFT HEART CATH AND CORONARY ANGIOGRAPHY: CATH118249

## 2017-03-20 LAB — HEPARIN LEVEL (UNFRACTIONATED): HEPARIN UNFRACTIONATED: 0.8 [IU]/mL — AB (ref 0.30–0.70)

## 2017-03-20 LAB — CBC
HCT: 44.9 % (ref 40.0–52.0)
Hemoglobin: 15.3 g/dL (ref 13.0–18.0)
MCH: 31.9 pg (ref 26.0–34.0)
MCHC: 34 g/dL (ref 32.0–36.0)
MCV: 93.7 fL (ref 80.0–100.0)
PLATELETS: 211 10*3/uL (ref 150–440)
RBC: 4.79 MIL/uL (ref 4.40–5.90)
RDW: 13.9 % (ref 11.5–14.5)
WBC: 11.2 10*3/uL — ABNORMAL HIGH (ref 3.8–10.6)

## 2017-03-20 LAB — BASIC METABOLIC PANEL
Anion gap: 6 (ref 5–15)
BUN: 18 mg/dL (ref 6–20)
CALCIUM: 9.1 mg/dL (ref 8.9–10.3)
CO2: 29 mmol/L (ref 22–32)
CREATININE: 1.35 mg/dL — AB (ref 0.61–1.24)
Chloride: 107 mmol/L (ref 101–111)
GFR calc Af Amer: >60 mL/min (ref >60)
GFR calc non Af Amer: 54 mL/min — ABNORMAL LOW (ref >60)
GLUCOSE: 111 mg/dL — AB (ref 65–99)
Potassium: 4.8 mmol/L (ref 3.5–5.1)
Sodium: 142 mmol/L (ref 135–145)

## 2017-03-20 LAB — TROPONIN I

## 2017-03-20 SURGERY — LEFT HEART CATH AND CORONARY ANGIOGRAPHY
Anesthesia: Moderate Sedation

## 2017-03-20 MED ORDER — DILTIAZEM HCL ER COATED BEADS 240 MG PO CP24
240.0000 mg | ORAL_CAPSULE | Freq: Every day | ORAL | Status: DC
  Administered 2017-03-20: 240 mg via ORAL
  Filled 2017-03-20: qty 2
  Filled 2017-03-20: qty 1

## 2017-03-20 MED ORDER — MIDAZOLAM HCL 2 MG/2ML IJ SOLN
INTRAMUSCULAR | Status: DC | PRN
  Administered 2017-03-20: 1 mg via INTRAVENOUS

## 2017-03-20 MED ORDER — VERAPAMIL HCL 2.5 MG/ML IV SOLN
INTRAVENOUS | Status: AC
  Filled 2017-03-20: qty 2

## 2017-03-20 MED ORDER — SODIUM CHLORIDE 0.9 % IV SOLN: 250.0000 mL | INTRAVENOUS | Status: DC | PRN

## 2017-03-20 MED ORDER — SODIUM CHLORIDE 0.9 % IV SOLN: INTRAVENOUS | Status: AC

## 2017-03-20 MED ORDER — FENTANYL CITRATE (PF) 100 MCG/2ML IJ SOLN
INTRAMUSCULAR | Status: DC | PRN
  Administered 2017-03-20: 50 ug via INTRAVENOUS

## 2017-03-20 MED ORDER — RIVAROXABAN 20 MG PO TABS: 20.0000 mg | ORAL_TABLET | Freq: Every day | ORAL | 0 refills | Status: DC

## 2017-03-20 MED ORDER — DILTIAZEM HCL ER COATED BEADS 240 MG PO CP24: 240.0000 mg | ORAL_CAPSULE | Freq: Every day | ORAL | 1 refills | Status: DC

## 2017-03-20 MED ORDER — RIVAROXABAN 20 MG PO TABS: 20.0000 mg | ORAL_TABLET | Freq: Every day | ORAL | Status: DC

## 2017-03-20 MED ORDER — DILTIAZEM HCL ER COATED BEADS 240 MG PO CP24: 240.0000 mg | ORAL_CAPSULE | Freq: Every day | ORAL | 0 refills | Status: DC

## 2017-03-20 MED ORDER — DILTIAZEM HCL ER COATED BEADS 240 MG PO CP24: 240.0000 mg | ORAL_CAPSULE | Freq: Every day | ORAL | Status: DC

## 2017-03-20 MED ORDER — IOPAMIDOL (ISOVUE-300) INJECTION 61%
INTRAVENOUS | Status: DC | PRN
  Administered 2017-03-20: 50 mL via INTRA_ARTERIAL

## 2017-03-20 MED ORDER — HEPARIN (PORCINE) IN NACL 2-0.9 UNIT/ML-% IJ SOLN
INTRAMUSCULAR | Status: AC
  Filled 2017-03-20: qty 500

## 2017-03-20 MED ORDER — RIVAROXABAN 20 MG PO TABS: 20.0000 mg | ORAL_TABLET | Freq: Every day | ORAL | 1 refills | Status: DC

## 2017-03-20 MED ORDER — MIDAZOLAM HCL 2 MG/2ML IJ SOLN
INTRAMUSCULAR | Status: AC
  Filled 2017-03-20: qty 2

## 2017-03-20 MED ORDER — HEPARIN SODIUM (PORCINE) 1000 UNIT/ML IJ SOLN
INTRAMUSCULAR | Status: DC | PRN
  Administered 2017-03-20: 4000 [IU] via INTRAVENOUS

## 2017-03-20 MED ORDER — SODIUM CHLORIDE 0.9% FLUSH: 3.0000 mL | Freq: Two times a day (BID) | INTRAVENOUS | Status: DC

## 2017-03-20 MED ORDER — ACETAMINOPHEN 325 MG PO TABS: 325.0000 mg | ORAL_TABLET | Freq: Four times a day (QID) | ORAL | Status: DC | PRN

## 2017-03-20 MED ORDER — FENTANYL CITRATE (PF) 100 MCG/2ML IJ SOLN
INTRAMUSCULAR | Status: AC
  Filled 2017-03-20: qty 2

## 2017-03-20 MED ORDER — ATORVASTATIN CALCIUM 20 MG PO TABS: 40.0000 mg | ORAL_TABLET | Freq: Every day | ORAL | Status: DC

## 2017-03-20 MED ORDER — SODIUM CHLORIDE 0.9% FLUSH: 3.0000 mL | INTRAVENOUS | Status: DC | PRN

## 2017-03-20 MED ORDER — HEPARIN SODIUM (PORCINE) 1000 UNIT/ML IJ SOLN
INTRAMUSCULAR | Status: AC
  Filled 2017-03-20: qty 1

## 2017-03-20 MED ORDER — ATORVASTATIN CALCIUM 20 MG PO TABS
40.0000 mg | ORAL_TABLET | Freq: Every day | ORAL | Status: DC
  Administered 2017-03-20: 40 mg via ORAL
  Filled 2017-03-20: qty 2

## 2017-03-20 MED ORDER — VERAPAMIL HCL 2.5 MG/ML IV SOLN
INTRAVENOUS | Status: DC | PRN
Start: 2017-03-20 — End: 2017-03-20
  Administered 2017-03-20: 2.5 mg via INTRA_ARTERIAL

## 2017-03-20 SURGICAL SUPPLY — 7 items
CATH INFINITI 5 FR JL3.5 (CATHETERS) ×3 IMPLANT
CATH OPTITORQUE JACKY 4.0 5F (CATHETERS) ×3 IMPLANT
DEVICE RAD TR BAND REGULAR (VASCULAR PRODUCTS) ×3 IMPLANT
GLIDESHEATH SLEND SS 6F .021 (SHEATH) ×3 IMPLANT
KIT MANI 3VAL PERCEP (MISCELLANEOUS) ×3 IMPLANT
PACK CARDIAC CATH (CUSTOM PROCEDURE TRAY) ×3 IMPLANT
WIRE ROSEN-J .035X260CM (WIRE) ×3 IMPLANT

## 2017-03-20 NOTE — Discharge Summary (Signed)
[facility] [place] NAME: Dalton Sanders    MR#:  [id number]  DATE OF BIRTH:  [birth year]  DATE OF ADMISSION:  03/19/2017 ADMITTING PHYSICIAN: Henreitta Leber, MD  DATE OF DISCHARGE:  03/20/17  PRIMARY CARE PHYSICIAN: Burnard Hawthorne, FNP    ADMISSION DIAGNOSIS:  Hypokalemia [E87.6] NSTEMI (non-ST elevated myocardial infarction) ([place]) [I21.4] Atrial fibrillation with RVR ([place]) [I48.91]  DISCHARGE DIAGNOSIS:  Active Problems:   Chest pain   Unstable angina ([place])  Atrial fibrillation with RVR SECONDARY DIAGNOSIS:   Past Medical History:  Diagnosis Date  . Arthritis    knees  . Chest pain    a. 03/2009 MV: EF 70%, no isch/infarct (Dr. Nehemiah Massed);  b. 05/2013 ETT: Ex time 6:07, no st/t changes, HTN response to exericse->med rx; 03/2016 CT chest: coronary Ca2+ and atherosclerosis noted.  Marland Kitchen GERD (gastroesophageal reflux disease)   . Hyperlipidemia   . Hypertension   . PAF (paroxysmal atrial fibrillation) ([facility])    a. CHA2DS2VASc = 1--on ASA.  Marland Kitchen Syncope and collapse     HOSPITAL COURSE:  HPI  Dalton Sanders  is a 66 y.o. male with a known history of Chronic atrial fibrillation, hypertension, hyperlipidemia, GERD and osteoarthritis who presents to the hospital due to atypical chest pain. 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 A few days later patient also developed some fluttering of his heart and some atypical chest pain. Usually when patient gets his atrial fibrillation attacks he takes Celexa dose of his Cardizem which he did but despite that he continued to have symptoms and did not feel right and therefore came to the ER for further evaluation. Emergency room initially patient was noted to be in rapid atrial fibrillation but the rate  slowed down on its own. Given his symptoms of atypical chest pain and previous history of cardiac disease Hospital services were contacted further treatment and evaluation.  1. Unstable angina with palpitations  Patient had cardiac catheterization with normal coronaries Okay to discharge patient from cardiology standpoint and outpatient follow-up with Dr. Fletcher Anon in 1 week as an outpatient   2. Chronic atrial fibrillation-rate controlled.  Continue Cardizem, dose increased to 240 mg Patient is not on long-term anticoagulation.  Disontinue aspirin, as patient is started on Xarelto  3. Hyperlipidemia-continue atorvastatin.  4. GERD-continue Protonix.     DISCHARGE CONDITIONS:   Stable  CONSULTS OBTAINED:  Treatment Team:  Wellington Hampshire, MD Minna Merritts, MD   PROCEDURES Cardiac catheterization-  DRUG ALLERGIES:   Allergies  Allergen Reactions  . Iodides   . Other     Hay Fever  . Oysters [Shellfish Allergy]   . Cortisone Palpitations    After cortizone shot for shoulder and sinus infection, went into Afib. This was prior to Cardizem.    DISCHARGE MEDICATIONS:   Current Discharge Medication List    START taking these medications   Details  acetaminophen (TYLENOL) 325 MG tablet Take 1 tablet (325 mg total) by mouth every 6 (six) hours as needed for mild pain (or Fever >/= 101).    rivaroxaban (XARELTO) 20 MG TABS tablet Take 1 tablet (20 mg total) by mouth daily with supper. Qty: 30 tablet, Refills: 0      CONTINUE these medications which  have CHANGED   Details  diltiazem (CARDIZEM CD) 240 MG 24 hr capsule Take 1 capsule (240 mg total) by mouth daily. Qty: 30 capsule, Refills: 0      CONTINUE these medications which have NOT CHANGED   Details  atorvastatin (LIPITOR) 80 MG tablet Take 1 tablet (80 mg total) by mouth daily. Qty: 90 tablet, Refills: 3   Associated Diagnoses: Hyperlipidemia, unspecified hyperlipidemia type    Cetirizine HCl 10 MG  CAPS Take 1 capsule by mouth daily.     Cholecalciferol (VITAMIN D) 2000 UNITS tablet Take 2,000 Units by mouth daily.    Krill Oil 300 MG CAPS Take 1 capsule by mouth daily.     levalbuterol (XOPENEX HFA) 45 MCG/ACT inhaler Inhale 1 puff into the lungs every 4 (four) hours as needed for wheezing or shortness of breath. Qty: 1 Inhaler, Refills: 0    Multiple Vitamin (ONE-A-DAY MENS PO) Take 1 tablet by mouth daily.     niacin 500 MG tablet Take 500 mg by mouth at bedtime.    omeprazole (PRILOSEC) 20 MG capsule Take 20 mg by mouth daily.    Red Yeast Rice Extract (RED YEAST RICE PO) Take 1,200 mg by mouth daily.     Saw Palmetto, Serenoa repens, (SAW PALMETTO PO) Take 40 mg by mouth daily.       STOP taking these medications     aspirin 81 MG tablet      diltiazem (CARDIZEM) 30 MG tablet          DISCHARGE INSTRUCTIONS:   Follow-up with primary care physician in one week Follow-up with cardiology Dr. Fletcher Anon in one week   DIET:  Cardiac diet  DISCHARGE CONDITION:  Stable  ACTIVITY:  Activity as tolerated  OXYGEN:  Home Oxygen: No.   Oxygen Delivery: room air  DISCHARGE LOCATION:  home   If you experience worsening of your admission symptoms, develop shortness of breath, life threatening emergency, suicidal or homicidal thoughts you must seek medical attention immediately by calling 911 or calling your MD immediately  if symptoms less severe.  You Must read complete instructions/literature along with all the possible adverse reactions/side effects for all the Medicines you take and that have been prescribed to you. Take any new Medicines after you have completely understood and accpet all the possible adverse reactions/side effects.   Please note  You were cared for by a hospitalist during your hospital stay. If you have any questions about your discharge medications or the care you received while you were in the hospital after you are discharged, you can call  the unit and asked to speak with the hospitalist on call if the hospitalist that took care of you is not available. Once you are discharged, your primary care physician will handle any further medical issues. Please note that NO REFILLS for any discharge medications will be authorized once you are discharged, as it is imperative that you return to your primary care physician (or establish a relationship with a primary care physician if you do not have one) for your aftercare needs so that they can reassess your need for medications and monitor your lab values.     Today  Chief Complaint  Patient presents with  . Irregular Heart Beat   Patient was seen after cardiac catheterization denies any chest pain or palpitations. Resting comfortably. Wife at bedside. Feels comfortable to go home  ROS:  CONSTITUTIONAL: Denies fevers, chills. Denies any fatigue, weakness.  EYES: Denies blurry vision, double  vision, eye pain. EARS, NOSE, THROAT: Denies tinnitus, ear pain, hearing loss. RESPIRATORY: Denies cough, wheeze, shortness of breath.  CARDIOVASCULAR: Denies chest pain, palpitations, edema.  GASTROINTESTINAL: Denies nausea, vomiting, diarrhea, abdominal pain. Denies bright red blood per rectum. GENITOURINARY: Denies dysuria, hematuria. ENDOCRINE: Denies nocturia or thyroid problems. HEMATOLOGIC AND LYMPHATIC: Denies easy bruising or bleeding. SKIN: Denies rash or lesion. MUSCULOSKELETAL: Denies pain in neck, back, shoulder, knees, hips or arthritic symptoms.  NEUROLOGIC: Denies paralysis, paresthesias.  PSYCHIATRIC: Denies anxiety or depressive symptoms.   VITAL SIGNS:  Blood pressure (!) 148/82, pulse 69, temperature 97.8 F (36.6 C), temperature source Oral, resp. rate 13, height 5\' 9"  (1.753 m), weight 82.6 kg (182 lb), SpO2 96 %.  I/O:    Intake/Output Summary (Last 24 hours) at 03/20/17 1533 Last data filed at 03/20/17 0600  Gross per 24 hour  Intake               10 ml  Output                 0 ml  Net               10 ml    PHYSICAL EXAMINATION:  GENERAL:  66 y.o.-year-old patient lying in the bed with no acute distress.  EYES: Pupils equal, round, reactive to light and accommodation. No scleral icterus. Extraocular muscles intact.  HEENT: Head atraumatic, normocephalic. Oropharynx and nasopharynx clear.  NECK:  Supple, no jugular venous distention. No thyroid enlargement, no tenderness.  LUNGS: Normal breath sounds bilaterally, no wheezing, rales,rhonchi or crepitation. No use of accessory muscles of respiration.  CARDIOVASCULAR: . irregularly irregular, no murmurs,  rubs, or gallops.  ABDOMEN: Soft, non-tender, non-distended. Bowel sounds present. No organomegaly or mass.  EXTREMITIES: No pedal edema, cyanosis, or clubbing.  NEUROLOGIC: Cranial nerves II through XII are intact. Muscle strength 5/5 in all extremities. Sensation intact. Gait not checked.  PSYCHIATRIC: The patient is alert and oriented x 3.  SKIN: No obvious rash, lesion, or ulcer.   DATA REVIEW:   CBC  Recent Labs Lab 03/20/17 0139  WBC 11.2*  HGB 15.3  HCT 44.9  PLT 211    Chemistries   Recent Labs Lab 03/20/17 0139  NA 142  K 4.8  CL 107  CO2 29  GLUCOSE 111*  BUN 18  CREATININE 1.35*  CALCIUM 9.1    Cardiac Enzymes  Recent Labs Lab 03/20/17 0139  TROPONINI <0.03    Microbiology Results  No results found for this or any previous visit.  RADIOLOGY:  Dg Chest 2 View  Result Date: 03/19/2017 CLINICAL DATA:  Chest pain and atrial fibrillation with rapid ventricular response. Patient ports associated chest pain. EXAM: CHEST  2 VIEW COMPARISON:  PA and lateral chest x-ray of February 03, 2017 FINDINGS: The lungs are well-expanded and clear. The heart and pulmonary vascularity are normal. The mediastinum is normal in width. There is calcification in the wall of the aortic arch. There is no pleural effusion. The bony thorax exhibits no acute abnormality. There are chronic  stable appearing left rib deformities. IMPRESSION: There is no active cardiopulmonary disease. Electronically Signed   By: David  Martinique M.D.   On: 03/19/2017 14:42    EKG:   Orders placed or performed during the hospital encounter of 03/19/17  . EKG 12-Lead  . EKG 12-Lead  . ED EKG  . ED EKG      Management plans discussed with the patient, family and they are in agreement.  CODE STATUS:     Code Status Orders        Start     Ordered   03/19/17 1742  Full code  Continuous     03/19/17 1742    Code Status History    Date Active Date Inactive Code Status Order ID Comments User Context   This patient has a current code status but no historical code status.      TOTAL TIME TAKING CARE OF THIS PATIENT: 43  minutes.   Note: This dictation was prepared with Dragon dictation along with smaller phrase technology. Any transcriptional errors that result from this process are unintentional.   @M[facility] @  on 03/20/2017 at 3:33 PM  Between 7am to 6pm - Pager - [phone]  After 6pm go to [email or web address] - password [email or web address] [facility]  Office  [phone]  CC: Primary care physician; Burnard Hawthorne, FNP

## 2017-03-20 NOTE — Care Management (Signed)
Provided patient with 30 day trial Xarelto coupon

## 2017-03-20 NOTE — Discharge Instructions (Signed)
Follow-up with primary care physician in one week Follow-up with cardiology Dr. Fletcher Anon in one week

## 2017-03-20 NOTE — Progress Notes (Signed)
Consulted with Dr Fletcher Anon regarding pt's allergy to iodine,ect. No orders at this time,.

## 2017-03-20 NOTE — Progress Notes (Signed)
ANTICOAGULATION CONSULT NOTE - Initial Consult  Pharmacy Consult for heparin Indication: chest pain/ACS  Allergies  Allergen Reactions  . Iodides   . Other     Hay Fever  . Oysters [Shellfish Allergy]   . Cortisone Palpitations    After cortizone shot for shoulder and sinus infection, went into Afib. This was prior to Cardizem.    Patient Measurements: Height: 5\' 9"  (175.3 cm) Weight: 182 lb 15.7 oz (83 kg) IBW/kg (Calculated) : 70.7 Heparin Dosing Weight: 83 kg  Vital Signs: Temp: 97.5 F (36.4 C) (09/28 0359) Temp Source: Oral (09/28 0359) BP: 116/67 (09/28 0359) Pulse Rate: 58 (09/28 0359)  Labs:  Recent Labs  03/19/17 1414 03/19/17 1754 03/19/17 2127 03/20/17 0139 03/20/17 0352  HGB 15.7  --   --  15.3  --   HCT 44.7  --   --  44.9  --   PLT 222  --   --  211  --   APTT  --  >160*  --   --   --   LABPROT  --  13.5  --   --   --   INR  --  1.04  --   --   --   HEPARINUNFRC  --   --   --   --  0.80*  CREATININE 1.12  --   --  1.35*  --   TROPONINI <0.03 <0.03 <0.03 <0.03  --     Estimated Creatinine Clearance: 54.6 mL/min (A) (by C-G formula based on SCr of 1.35 mg/dL (H)).   Medical History: Past Medical History:  Diagnosis Date  . Arthritis    knees  . Chest pain    a. 03/2009 MV: EF 70%, no isch/infarct (Dr. Nehemiah Massed);  b. 05/2013 ETT: Ex time 6:07, no st/t changes, HTN response to exericse->med rx; 03/2016 CT chest: coronary Ca2+ and atherosclerosis noted.  Marland Kitchen GERD (gastroesophageal reflux disease)   . Hyperlipidemia   . Hypertension   . PAF (paroxysmal atrial fibrillation) ([facility])    a. CHA2DS2VASc = 1--on ASA.  Marland Kitchen Syncope and collapse     Medications:  Scheduled:  . aspirin  81 mg Oral Pre-Cath  . aspirin EC  81 mg Oral BID  . atorvastatin  80 mg Oral Daily  . cholecalciferol  2,000 Units Oral Daily  . diltiazem  180 mg Oral Daily  . loratadine  10 mg Oral Daily  . niacin  500 mg Oral QHS  . pantoprazole  40 mg Oral Daily  . sodium  chloride flush  3 mL Intravenous Q12H    Assessment: Patient admitted for evaluation of CP. trops have been negative x 3. Patient was initially placed on heparin drip w/o pharmacy consult. Patient received a bolus of 4000 units and was started on a rate of 1000 units/hr w/o any monitoring. MD is now consulting pharmacy for management of heparin.  Goal of Therapy:  Heparin level 0.3-0.7 units/ml Monitor platelets by anticoagulation protocol: Yes   Plan:  Baseline labs ordered, aPTT > 160, but this is because lab was drawn while heparin running.  9/28 @ 0400 HL 0.80 supratherapeutic. Will decrease rate to 900 units/hr and will recheck HL @ 1200. CBC appears stable.  Tobie Lords, PharmD, BCPS Clinical Pharmacist 03/20/2017

## 2017-03-20 NOTE — Care Management Obs Status (Signed)
[facility] NOTIFICATION   Patient Details  Name: Dalton Sanders MRN: [id number] Date of Birth: 12-03-1950   Medicare Observation Status Notification Given:  Yes Notice signed by wife as patient has had conscious sedation drugs, one given to patient and the other to HIM for scanning    Katrina Stack, RN 03/20/2017, 2:01 PM

## 2017-03-20 NOTE — Interval H&P Note (Signed)
History and Physical Interval Note:  03/20/2017 7:49 AM  Dalton Sanders  has presented today for surgery, with the diagnosis of unstable angina  The various methods of treatment have been discussed with the patient and family. After consideration of risks, benefits and other options for treatment, the patient has consented to  Procedure(s): LEFT HEART CATH AND CORONARY ANGIOGRAPHY (N/A) as a surgical intervention .  The patient's history has been reviewed, patient examined, no change in status, stable for surgery.  I have reviewed the patient's chart and labs.  Questions were answered to the patient's satisfaction.     Kathlyn Sacramento

## 2017-04-13 DIAGNOSIS — G4733 Obstructive sleep apnea (adult) (pediatric): Secondary | ICD-10-CM | POA: Diagnosis not present

## 2017-05-04 DIAGNOSIS — D0462 Carcinoma in situ of skin of left upper limb, including shoulder: Secondary | ICD-10-CM | POA: Diagnosis not present

## 2017-05-13 ENCOUNTER — Telehealth: Payer: Self-pay | Admitting: Family

## 2017-05-13 ENCOUNTER — Other Ambulatory Visit: Payer: Self-pay | Admitting: *Deleted

## 2017-05-13 ENCOUNTER — Other Ambulatory Visit: Payer: Self-pay

## 2017-05-13 MED ORDER — DILTIAZEM HCL ER COATED BEADS 240 MG PO CP24: 240.0000 mg | ORAL_CAPSULE | Freq: Every day | ORAL | 1 refills | Status: DC

## 2017-05-13 MED ORDER — RIVAROXABAN 20 MG PO TABS: 20.0000 mg | ORAL_TABLET | Freq: Every day | ORAL | 1 refills | Status: DC

## 2017-05-13 NOTE — Telephone Encounter (Signed)
Please let him know that he cannot switch to dr. Nicki Reaper

## 2017-05-13 NOTE — Telephone Encounter (Signed)
Lm on vm letting pt know

## 2017-05-13 NOTE — Telephone Encounter (Signed)
Refill Request Xarelto.

## 2017-05-13 NOTE — Telephone Encounter (Signed)
Please advise if ok to refill diltiazem.

## 2017-05-13 NOTE — Telephone Encounter (Signed)
Copied from [place]. Topic: Quick Communication - See Telephone Encounter >> May 13, 2017  9:47 AM Antonieta Iba C wrote: CRM for notification. See Telephone encounter for:  05/13/17.    Pt called in because he last seen Ar[facility] on 03/31/16 and received a change to his medication. Pt says that he was never advised to follow up to have lab work after change to be sure that things are okay. Pt would like to schedule an apt but pt would like to switch to Dr. Nicki Reaper if possible. Pt would like a call back to be advised further.

## 2017-05-18 ENCOUNTER — Telehealth: Payer: Self-pay | Admitting: Cardiovascular Disease

## 2017-05-18 ENCOUNTER — Other Ambulatory Visit: Payer: Self-pay

## 2017-05-18 MED ORDER — DILTIAZEM HCL ER COATED BEADS 240 MG PO CP24: 240.0000 mg | ORAL_CAPSULE | Freq: Every day | ORAL | 1 refills | Status: DC

## 2017-05-18 MED ORDER — RIVAROXABAN 20 MG PO TABS: 20.0000 mg | ORAL_TABLET | Freq: Every day | ORAL | 6 refills | Status: DC

## 2017-05-18 NOTE — Telephone Encounter (Signed)
°*  STAT* If patient is at the pharmacy, call can be transferred to refill team.   1. Which medications need to be refilled? (please list name of each medication and dose if known) xarelto 20 mg also his Diltiazem   2. Which pharmacy/location (including street and city if local pharmacy) is medication to be sent to?[facility] in [place]   3. Do they need a 30 day or 90 day supply? 90 day

## 2017-05-18 NOTE — Telephone Encounter (Signed)
Xarelto refilled.

## 2017-05-18 NOTE — Telephone Encounter (Signed)
Requested Prescriptions   Signed Prescriptions Disp Refills  . diltiazem (CARDIZEM CD) 240 MG 24 hr capsule 30 capsule 1    Sig: Take 1 capsule (240 mg total) by mouth daily.    Authorizing Provider: Kathlyn Sacramento A    Ordering User: Janan Ridge

## 2017-05-18 NOTE — Telephone Encounter (Signed)
Please advise refill for Xarelto 20mg   Refill for Diltiazem has already been sent in to [facility] in [place]  Requested Prescriptions   Signed Prescriptions Disp Refills  . diltiazem (CARDIZEM CD) 240 MG 24 hr capsule 30 capsule 1    Sig: Take 1 capsule (240 mg total) by mouth daily.    Authorizing Provider: Kathlyn Sacramento A    Ordering User: Janan Ridge

## 2017-05-21 ENCOUNTER — Telehealth: Payer: Self-pay | Admitting: Family

## 2017-05-21 DIAGNOSIS — E7800 Pure hypercholesterolemia, unspecified: Secondary | ICD-10-CM

## 2017-05-21 DIAGNOSIS — I10 Essential (primary) hypertension: Secondary | ICD-10-CM

## 2017-05-21 NOTE — Telephone Encounter (Signed)
Copied from [place]. Topic: Quick Communication - See Telephone Encounter >> May 21, 2017  4:56 PM Boyd Kerbs wrote: CRM for notification. See Telephone encounter for:  Since prescription has tripled from 40 to 44.  He is wanting to have lab work done to check on his liver and all labs done. Please schedule appt   Ok to leave message on VM  05/21/17.

## 2017-05-22 ENCOUNTER — Ambulatory Visit: Payer: Medicare Other | Admitting: Internal Medicine

## 2017-05-22 NOTE — Telephone Encounter (Signed)
Ordered cmet and fasting lipids

## 2017-05-22 NOTE — Telephone Encounter (Signed)
Patient has not been seen by any of our providers since 03/31/2016. Ok to place lab orders or does he need an office visit with you or another provider?

## 2017-05-22 NOTE — Telephone Encounter (Signed)
Patient scheduled for labs

## 2017-05-26 ENCOUNTER — Ambulatory Visit (INDEPENDENT_AMBULATORY_CARE_PROVIDER_SITE_OTHER): Payer: Medicare Other | Admitting: Cardiovascular Disease

## 2017-05-26 ENCOUNTER — Encounter: Payer: Self-pay | Admitting: Cardiovascular Disease

## 2017-05-26 VITALS — BP 150/80 | HR 74 | Ht 66.0 in | Wt 178.5 lb

## 2017-05-26 DIAGNOSIS — I6523 Occlusion and stenosis of bilateral carotid arteries: Secondary | ICD-10-CM | POA: Diagnosis not present

## 2017-05-26 DIAGNOSIS — I480 Paroxysmal atrial fibrillation: Secondary | ICD-10-CM

## 2017-05-26 DIAGNOSIS — I10 Essential (primary) hypertension: Secondary | ICD-10-CM | POA: Diagnosis not present

## 2017-05-26 DIAGNOSIS — E7800 Pure hypercholesterolemia, unspecified: Secondary | ICD-10-CM | POA: Diagnosis not present

## 2017-05-26 MED ORDER — FLECAINIDE ACETATE 50 MG PO TABS
50.0000 mg | ORAL_TABLET | Freq: Two times a day (BID) | ORAL | 3 refills | Status: DC
Start: 2017-05-26 — End: 2018-05-18

## 2017-05-26 NOTE — Patient Instructions (Addendum)
Medication Instructions:  Your physician has recommended you make the following change in your medication:  START taking flecainide 50mg  twice a day   Labwork: none  Testing/Procedures: Your physician has requested that you have an echocardiogram. Echocardiography is a painless test that uses sound waves to create images of your heart. It provides your doctor with information about the size and shape of your heart and how well your heart's chambers and valves are working. This procedure takes approximately one hour. There are no restrictions for this procedure.    Follow-Up: Your physician recommends that you schedule a follow-up appointment in: 1 month with Dr. Fletcher Anon.    Any Other Special Instructions Will Be Listed Below (If Applicable).     If you need a refill on your cardiac medications before your next appointment, please call your pharmacy.  Echocardiogram An echocardiogram, or echocardiography, uses sound waves (ultrasound) to produce an image of your heart. The echocardiogram is simple, painless, obtained within a short period of time, and offers valuable information to your health care provider. The images from an echocardiogram can provide information such as:  Evidence of coronary artery disease (CAD).  Heart size.  Heart muscle function.  Heart valve function.  Aneurysm detection.  Evidence of a past heart attack.  Fluid buildup around the heart.  Heart muscle thickening.  Assess heart valve function.  Tell a health care provider about:  Any allergies you have.  All medicines you are taking, including vitamins, herbs, eye drops, creams, and over-the-counter medicines.  Any problems you or family members have had with anesthetic medicines.  Any blood disorders you have.  Any surgeries you have had.  Any medical conditions you have.  Whether you are pregnant or may be pregnant. What happens before the procedure? No special preparation is needed.  Eat and drink normally. What happens during the procedure?  In order to produce an image of your heart, gel will be applied to your chest and a wand-like tool (transducer) will be moved over your chest. The gel will help transmit the sound waves from the transducer. The sound waves will harmlessly bounce off your heart to allow the heart images to be captured in real-time motion. These images will then be recorded.  You may need an IV to receive a medicine that improves the quality of the pictures. What happens after the procedure? You may return to your normal schedule including diet, activities, and medicines, unless your health care provider tells you otherwise. This information is not intended to replace advice given to you by your health care provider. Make sure you discuss any questions you have with your health care provider. Document Released: 06/06/2000 Document Revised: 01/26/2016 Document Reviewed: 02/14/2013 Elsevier Interactive Patient Education  2017 Elsevier Inc. Flecainide tablets What is this medicine? FLECAINIDE (FLEK a nide) is an antiarrhythmic drug. This medicine is used to prevent irregular heart rhythm. It can also slow down fast heartbeats called tachycardia. This medicine may be used for other purposes; ask your health care provider or pharmacist if you have questions. COMMON BRAND NAME(S): Tambocor What should I tell my health care provider before I take this medicine? They need to know if you have any of these conditions: -abnormal levels of potassium in the blood -heart disease including heart rhythm and heart rate problems -kidney or liver disease -recent heart attack -an unusual or allergic reaction to flecainide, local anesthetics, other medicines, foods, dyes, or preservatives -pregnant or trying to get pregnant -breast-feeding How should I  use this medicine? Take this medicine by mouth with a glass of water. Follow the directions on the prescription label.  You can take this medicine with or without food. Take your doses at regular intervals. Do not take your medicine more often than directed. Do not stop taking this medicine suddenly. This may cause serious, heart-related side effects. If your doctor wants you to stop the medicine, the dose may be slowly lowered over time to avoid any side effects. Talk to your pediatrician regarding the use of this medicine in children. While this drug may be prescribed for children as young as 1 year of age for selected conditions, precautions do apply. Overdosage: If you think you have taken too much of this medicine contact a poison control center or emergency room at once. NOTE: This medicine is only for you. Do not share this medicine with others. What if I miss a dose? If you miss a dose, take it as soon as you can. If it is almost time for your next dose, take only that dose. Do not take double or extra doses. What may interact with this medicine? Do not take this medicine with any of the following medications: -amoxapine -arsenic trioxide -certain antibiotics like clarithromycin, erythromycin, gatifloxacin, gemifloxacin, levofloxacin, moxifloxacin, sparfloxacin, or troleandomycin -certain antidepressants called tricyclic antidepressants like amitriptyline, imipramine, or nortriptyline -certain medicines to control heart rhythm like disopyramide, dofetilide, encainide, moricizine, procainamide, propafenone, and quinidine -cisapride -cyclobenzaprine -delavirdine -droperidol -haloperidol -hawthorn -imatinib -levomethadyl -maprotiline -medicines for malaria like chloroquine and halofantrine -pentamidine -phenothiazines like chlorpromazine, mesoridazine, prochlorperazine, thioridazine -pimozide -quinine -ranolazine -ritonavir -sertindole -ziprasidone This medicine may also interact with the following medications: -cimetidine -medicines for angina or high blood pressure -medicines to control heart  rhythm like amiodarone and digoxin This list may not describe all possible interactions. Give your health care provider a list of all the medicines, herbs, non-prescription drugs, or dietary supplements you use. Also tell them if you smoke, drink alcohol, or use illegal drugs. Some items may interact with your medicine. What should I watch for while using this medicine? Visit your doctor or health care professional for regular checks on your progress. Because your condition and the use of this medicine carries some risk, it is a good idea to carry an identification card, necklace or bracelet with details of your condition, medications and doctor or health care professional. Check your blood pressure and pulse rate regularly. Ask your health care professional what your blood pressure and pulse rate should be, and when you should contact him or her. Your doctor or health care professional also may schedule regular blood tests and electrocardiograms to check your progress. You may get drowsy or dizzy. Do not drive, use machinery, or do anything that needs mental alertness until you know how this medicine affects you. Do not stand or sit up quickly, especially if you are an older patient. This reduces the risk of dizzy or fainting spells. Alcohol can make you more dizzy, increase flushing and rapid heartbeats. Avoid alcoholic drinks. What side effects may I notice from receiving this medicine? Side effects that you should report to your doctor or health care professional as soon as possible: -chest pain, continued irregular heartbeats -difficulty breathing -swelling of the legs or feet -trembling, shaking -unusually weak or tired Side effects that usually do not require medical attention (report to your doctor or health care professional if they continue or are bothersome): -blurred vision -constipation -headache -nausea, vomiting -stomach pain This list may not describe all  possible side effects.  Call your doctor for medical advice about side effects. You may report side effects to FDA at 1-800-FDA-1088. Where should I keep my medicine? Keep out of the reach of children. Store at room temperature between 15 and 30 degrees C (59 and 86 degrees F). Protect from light. Keep container tightly closed. Throw away any unused medicine after the expiration date. NOTE: This sheet is a summary. It may not cover all possible information. If you have questions about this medicine, talk to your doctor, pharmacist, or health care provider.  2018 Elsevier/Gold Standard (2007-10-13 16:46:09)
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
## 2017-05-29 ENCOUNTER — Other Ambulatory Visit (INDEPENDENT_AMBULATORY_CARE_PROVIDER_SITE_OTHER): Payer: Medicare Other

## 2017-05-29 DIAGNOSIS — E7800 Pure hypercholesterolemia, unspecified: Secondary | ICD-10-CM

## 2017-05-29 DIAGNOSIS — I10 Essential (primary) hypertension: Secondary | ICD-10-CM

## 2017-05-29 LAB — COMPREHENSIVE METABOLIC PANEL
ALT: 24 U/L (ref 0–53)
AST: 20 U/L (ref 0–37)
Albumin: 4.2 g/dL (ref 3.5–5.2)
Alkaline Phosphatase: 72 U/L (ref 39–117)
BUN: 16 mg/dL (ref 6–23)
CHLORIDE: 104 meq/L (ref 96–112)
CO2: 30 mEq/L (ref 19–32)
Calcium: 9.3 mg/dL (ref 8.4–10.5)
Creatinine, Ser: 1.15 mg/dL (ref 0.40–1.50)
GFR: 67.73 mL/min (ref >60.00)
Glucose, Bld: 101 mg/dL — ABNORMAL HIGH (ref 70–99)
POTASSIUM: 4.6 meq/L (ref 3.5–5.1)
SODIUM: 140 meq/L (ref 135–145)
Total Bilirubin: 0.6 mg/dL (ref 0.2–1.2)
Total Protein: 6.9 g/dL (ref 6.0–8.3)

## 2017-05-29 LAB — LIPID PANEL
Cholesterol: 141 mg/dL (ref 0–200)
HDL: 53.2 mg/dL (ref >39.00)
LDL CALC: 64 mg/dL (ref 0–99)
NONHDL: 87.3
Total CHOL/HDL Ratio: 3
Triglycerides: 115 mg/dL (ref 0.0–149.0)
VLDL: 23 mg/dL (ref 0.0–40.0)

## 2017-05-29 NOTE — Progress Notes (Signed)
Your cholesterol, liver and kidney function are normal.  You do not need any medication changes. Please plan to repeat the labs in 6 months.    Regards,   Dr. Kirsty Monjaraz  

## 2017-06-03 ENCOUNTER — Other Ambulatory Visit: Payer: Self-pay | Admitting: Internal Medicine

## 2017-06-03 DIAGNOSIS — Z125 Encounter for screening for malignant neoplasm of prostate: Secondary | ICD-10-CM

## 2017-06-05 ENCOUNTER — Other Ambulatory Visit (INDEPENDENT_AMBULATORY_CARE_PROVIDER_SITE_OTHER): Payer: Medicare Other

## 2017-06-05 ENCOUNTER — Ambulatory Visit (INDEPENDENT_AMBULATORY_CARE_PROVIDER_SITE_OTHER): Payer: Medicare Other

## 2017-06-05 ENCOUNTER — Other Ambulatory Visit: Payer: Self-pay

## 2017-06-05 DIAGNOSIS — Z125 Encounter for screening for malignant neoplasm of prostate: Secondary | ICD-10-CM

## 2017-06-05 DIAGNOSIS — I480 Paroxysmal atrial fibrillation: Secondary | ICD-10-CM | POA: Diagnosis not present

## 2017-06-05 LAB — PSA, MEDICARE: PSA: 2.14 ng/ml (ref 0.10–4.00)

## 2017-06-09 ENCOUNTER — Ambulatory Visit: Payer: Medicare Other | Attending: Otolaryngology

## 2017-06-09 ENCOUNTER — Other Ambulatory Visit: Payer: Self-pay

## 2017-06-09 DIAGNOSIS — I493 Ventricular premature depolarization: Secondary | ICD-10-CM | POA: Insufficient documentation

## 2017-06-09 DIAGNOSIS — G4733 Obstructive sleep apnea (adult) (pediatric): Secondary | ICD-10-CM | POA: Diagnosis not present

## 2017-06-09 MED ORDER — DILTIAZEM HCL ER COATED BEADS 240 MG PO CP24: 240.0000 mg | ORAL_CAPSULE | Freq: Every day | ORAL | 3 refills | Status: DC

## 2017-06-09 NOTE — Telephone Encounter (Signed)
Refill sent for Diltiazem 240 mg

## 2017-06-11 ENCOUNTER — Telehealth: Payer: Self-pay

## 2017-06-11 NOTE — Telephone Encounter (Signed)
Informed patient that his levels where normal as per provider.  The only "flag" was that his glucose was 101.  Patient understood and had no further question,comments,and or concerns.

## 2017-06-11 NOTE — Telephone Encounter (Signed)
Copied from [place] [id number]. Topic: General - Other >> Jun 10, 2017  2:44 PM Ivar Drape wrote: Reason for CRM: Patient said he received the test results of his PSA from [facility] and was told everything was good but he see's a flag on it and he want's to know what that means.

## 2017-06-16 ENCOUNTER — Other Ambulatory Visit: Payer: Self-pay | Admitting: Family

## 2017-06-16 DIAGNOSIS — E785 Hyperlipidemia, unspecified: Secondary | ICD-10-CM

## 2017-06-25 DIAGNOSIS — J301 Allergic rhinitis due to pollen: Secondary | ICD-10-CM | POA: Diagnosis not present

## 2017-06-25 DIAGNOSIS — G4733 Obstructive sleep apnea (adult) (pediatric): Secondary | ICD-10-CM | POA: Diagnosis not present

## 2017-07-09 ENCOUNTER — Ambulatory Visit (INDEPENDENT_AMBULATORY_CARE_PROVIDER_SITE_OTHER): Payer: Medicare Other | Admitting: Cardiovascular Disease

## 2017-07-09 VITALS — BP 140/70 | HR 69 | Ht 66.0 in | Wt 180.5 lb

## 2017-07-09 DIAGNOSIS — E7800 Pure hypercholesterolemia, unspecified: Secondary | ICD-10-CM

## 2017-07-09 DIAGNOSIS — I480 Paroxysmal atrial fibrillation: Secondary | ICD-10-CM

## 2017-07-09 DIAGNOSIS — I10 Essential (primary) hypertension: Secondary | ICD-10-CM | POA: Diagnosis not present

## 2017-07-09 MED ORDER — DILTIAZEM HCL 30 MG PO TABS: 30.0000 mg | ORAL_TABLET | ORAL | 3 refills | Status: DC | PRN
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
## 2017-07-09 NOTE — Patient Instructions (Signed)
Medication Instructions:  Your physician has recommended you make the following change in your medication:  STOP taking red yeast rice STOP taking niacin    Labwork: none  Testing/Procedures: none  Follow-Up: Your physician wants you to follow-up in: 6 months with Dr. Fletcher Anon.  You will receive a reminder letter in the mail two months in advance. If you don't receive a letter, please call our office to schedule the follow-up appointment.   Any Other Special Instructions Will Be Listed Below (If Applicable).     If you need a refill on your cardiac medications before your next appointment, please call your pharmacy.

## 2017-07-13 NOTE — Addendum Note (Signed)
Addended by: Britt Bottom on: 07/13/2017 11:28 AM   Modules accepted: Orders

## 2017-09-01 DIAGNOSIS — J301 Allergic rhinitis due to pollen: Secondary | ICD-10-CM | POA: Diagnosis not present

## 2017-09-15 ENCOUNTER — Other Ambulatory Visit: Payer: Self-pay | Admitting: Family

## 2017-09-15 DIAGNOSIS — E785 Hyperlipidemia, unspecified: Secondary | ICD-10-CM

## 2017-09-21 DIAGNOSIS — D2261 Melanocytic nevi of right upper limb, including shoulder: Secondary | ICD-10-CM | POA: Diagnosis not present

## 2017-09-21 DIAGNOSIS — D225 Melanocytic nevi of trunk: Secondary | ICD-10-CM | POA: Diagnosis not present

## 2017-09-21 DIAGNOSIS — Z08 Encounter for follow-up examination after completed treatment for malignant neoplasm: Secondary | ICD-10-CM | POA: Diagnosis not present

## 2017-09-21 DIAGNOSIS — D485 Neoplasm of uncertain behavior of skin: Secondary | ICD-10-CM | POA: Diagnosis not present

## 2017-09-21 DIAGNOSIS — D2272 Melanocytic nevi of left lower limb, including hip: Secondary | ICD-10-CM | POA: Diagnosis not present

## 2017-09-21 DIAGNOSIS — L570 Actinic keratosis: Secondary | ICD-10-CM | POA: Diagnosis not present

## 2017-09-21 DIAGNOSIS — D2262 Melanocytic nevi of left upper limb, including shoulder: Secondary | ICD-10-CM | POA: Diagnosis not present

## 2017-09-21 DIAGNOSIS — Z85828 Personal history of other malignant neoplasm of skin: Secondary | ICD-10-CM | POA: Diagnosis not present

## 2017-09-21 DIAGNOSIS — X32XXXA Exposure to sunlight, initial encounter: Secondary | ICD-10-CM | POA: Diagnosis not present

## 2017-09-21 DIAGNOSIS — C44622 Squamous cell carcinoma of skin of right upper limb, including shoulder: Secondary | ICD-10-CM | POA: Diagnosis not present

## 2017-09-21 DIAGNOSIS — D2271 Melanocytic nevi of right lower limb, including hip: Secondary | ICD-10-CM | POA: Diagnosis not present

## 2017-09-21 DIAGNOSIS — D0461 Carcinoma in situ of skin of right upper limb, including shoulder: Secondary | ICD-10-CM | POA: Diagnosis not present

## 2017-10-05 ENCOUNTER — Other Ambulatory Visit: Payer: Self-pay | Admitting: Cardiovascular Disease

## 2017-10-21 IMAGING — CR DG CHEST 2V
2 series · 2 of 2 positions shown · non-contrast
Comparison: 04/11/2016

CLINICAL DATA: Shortness of Breath

EXAM:
CHEST  2 VIEW

[chest pa]
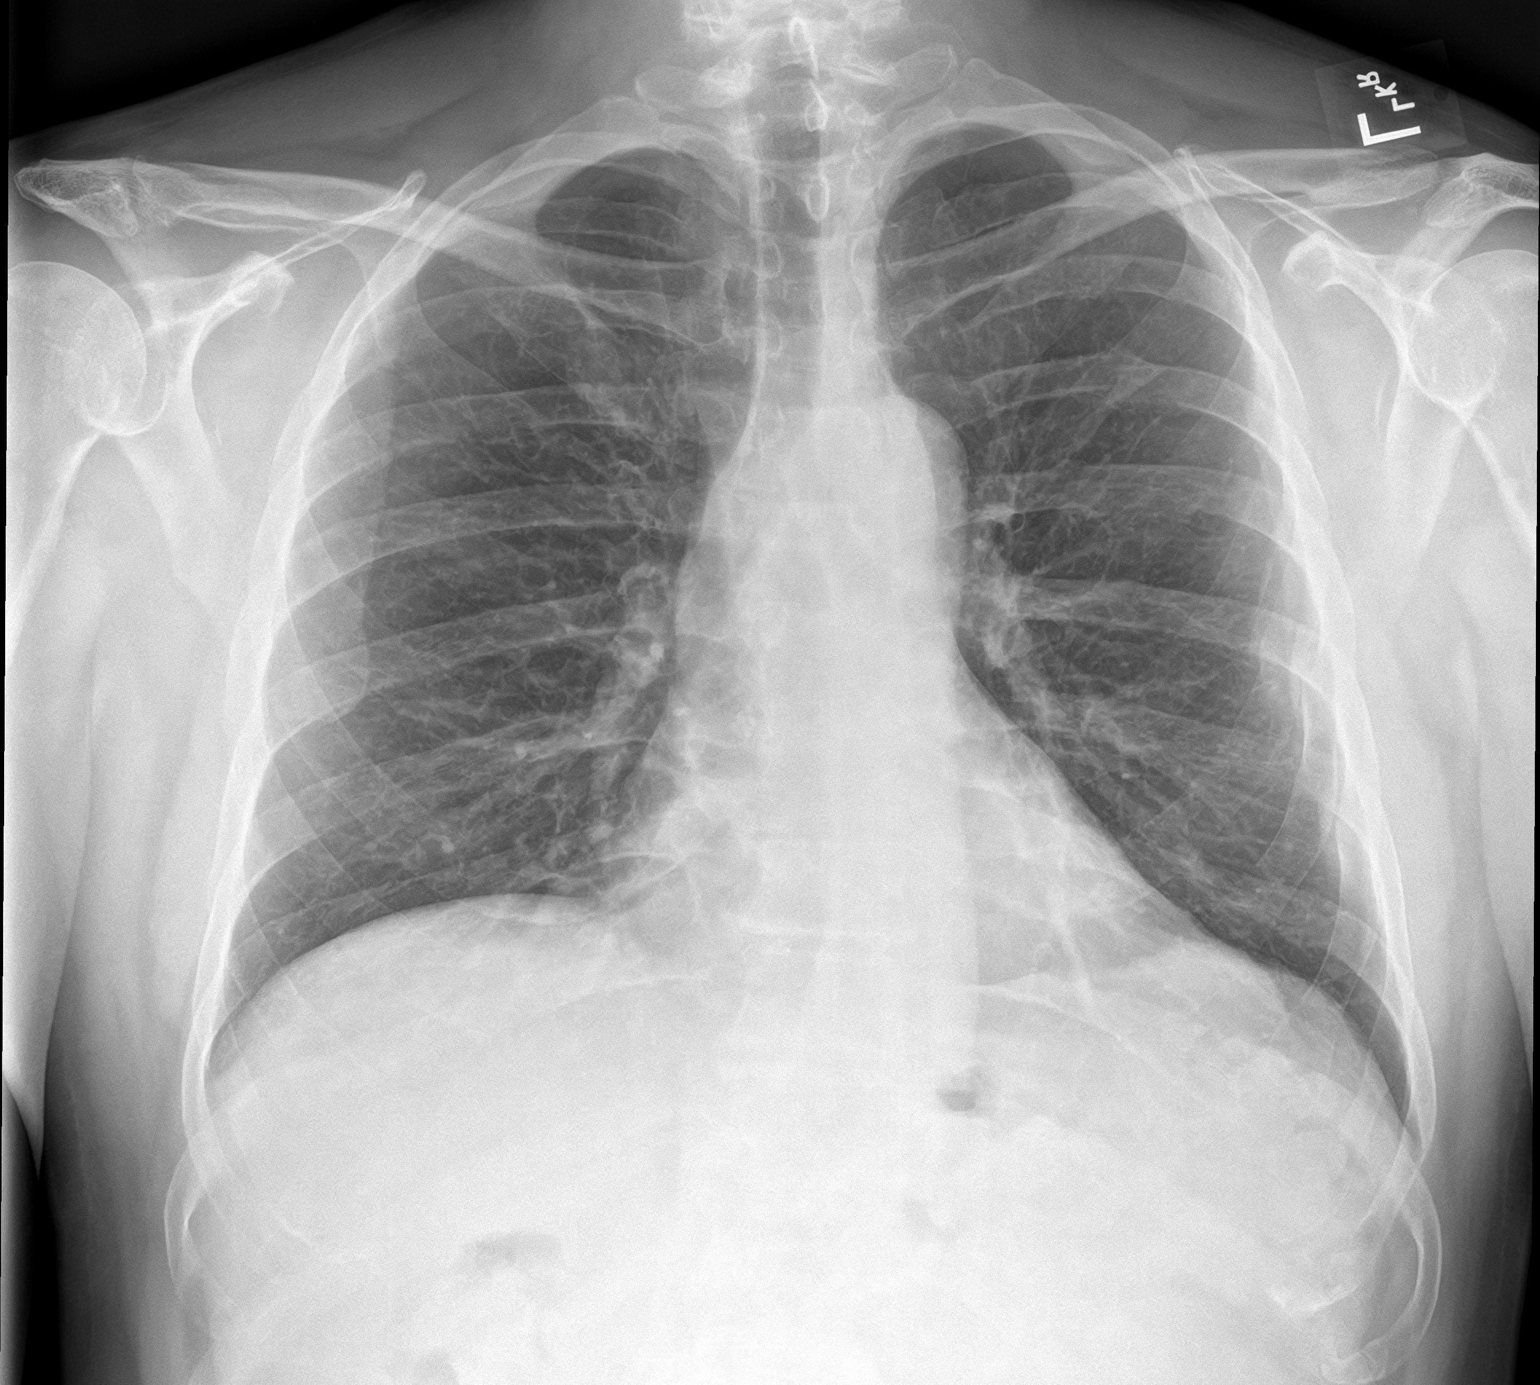

[chest lat]
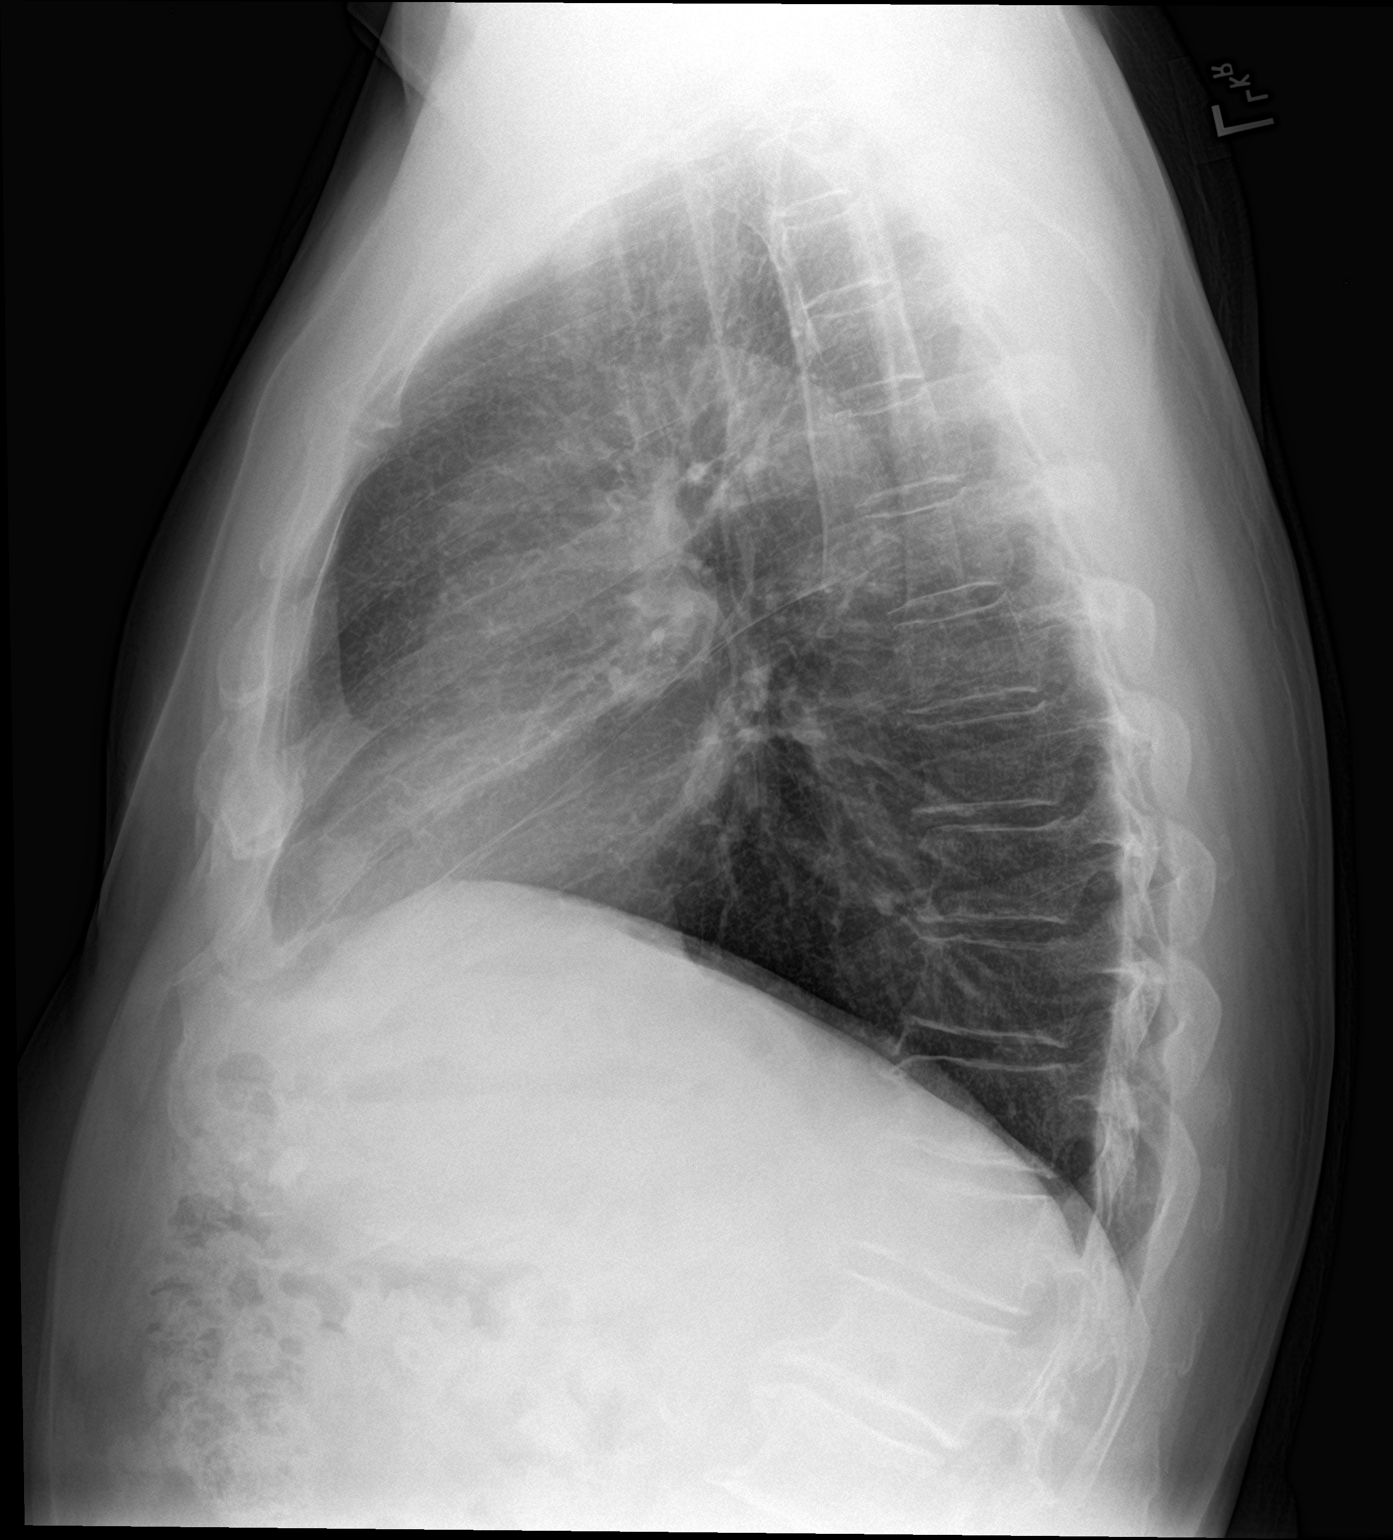

[2 of 2 positions shown; findings below may reference images not displayed]

FINDINGS: Heart and mediastinal contours are within normal limits. No focal
opacities or effusions. No acute bony abnormality.
IMPRESSION: No active cardiopulmonary disease.

## 2017-11-02 ENCOUNTER — Encounter: Payer: Self-pay | Admitting: Family

## 2017-11-02 ENCOUNTER — Ambulatory Visit (INDEPENDENT_AMBULATORY_CARE_PROVIDER_SITE_OTHER): Payer: Medicare Other | Admitting: Family

## 2017-11-02 VITALS — BP 120/82 | HR 59 | Temp 97.8°F | Resp 16 | Ht 66.0 in | Wt 179.2 lb

## 2017-11-02 DIAGNOSIS — R3989 Other symptoms and signs involving the genitourinary system: Secondary | ICD-10-CM

## 2017-11-02 DIAGNOSIS — I480 Paroxysmal atrial fibrillation: Secondary | ICD-10-CM

## 2017-11-02 DIAGNOSIS — I10 Essential (primary) hypertension: Secondary | ICD-10-CM | POA: Diagnosis not present

## 2017-11-02 DIAGNOSIS — R351 Nocturia: Secondary | ICD-10-CM | POA: Insufficient documentation

## 2017-11-02 DIAGNOSIS — K219 Gastro-esophageal reflux disease without esophagitis: Secondary | ICD-10-CM | POA: Diagnosis not present

## 2017-11-02 DIAGNOSIS — J45909 Unspecified asthma, uncomplicated: Secondary | ICD-10-CM | POA: Insufficient documentation

## 2017-11-02 DIAGNOSIS — Z0000 Encounter for general adult medical examination without abnormal findings: Secondary | ICD-10-CM

## 2017-11-02 NOTE — Assessment & Plan Note (Signed)
Controlled on PPI. Education about the long-term use of PPIs.  Advised patient that he should have an EGD with his next colonoscopy, sooner if red flag symptoms.  Will follow

## 2017-11-02 NOTE — Assessment & Plan Note (Signed)
Worsening urinary hesitancy, outflow as described by patient. Reassured that PSA has stayed stable at baseline for the past 3 years.  Pending urology consult.

## 2017-11-02 NOTE — Assessment & Plan Note (Signed)
Controlled. Will continue current regimen 

## 2017-11-02 NOTE — Assessment & Plan Note (Addendum)
Clinically asymptomatic.  Recent EKG by Dr Fletcher Anon.  Controlled with flecainide, diltiazem. On xarelto. Will follow.
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
## 2017-11-02 NOTE — Assessment & Plan Note (Addendum)
Welcome to [employer]. He politely declines prostate exam since pending  Urology consult. Decline pneumonia vaccine. Advised shingrex series. Advised to complete living will, medical POA. Advised eye exam. No labs today since done 05/2017.  Education preventative care given to patient-see after visit summary.

## 2017-11-02 NOTE — Assessment & Plan Note (Signed)
Stable today. Follows with allergy.

## 2017-11-02 NOTE — Patient Instructions (Addendum)
Advise eye appointment.   Today we discussed referrals, orders. UROLOGY   I have placed these orders in the system for you.  Please be sure to give [place] a call if you have not heard from our office regarding scheduling a test or regarding referral in a timely manner.  It is very important that you let me know as soon as possible.    Long term use beyond 3 months of proton pump inhibitors , also called PPI's, is associated with malabsorption of vitamins, chronic kidney disease, fracture risk, and diarrheal illnesses. PPI's include Nexium, Prilosec, Protonix, Dexilant, and Prevacid.   I generally recommend trying to control acid reflux with lifestyle modifications including avoiding trigger foods, not eating 2 hours prior to bedtime. You may use histamine 2 blockers daily to twice daily ( this is Zantac, Pepcid) and then when symptoms flare, start back on PPI for short course.   Of note, we will need to do an endoscopy ( upper GI) to evaluate your esophagus, stomach in the future if acid reflux persists are you develop red flag symptoms: trouble swallowing, hoarseness, chronic cough, unexplained weight loss.    Health Maintenance, Male A healthy lifestyle and preventive care is important for your health and wellness. Ask your health care provider about what schedule of regular examinations is right for you. What should I know about weight and diet? Eat a Healthy Diet  Eat plenty of vegetables, fruits, whole grains, low-fat dairy products, and lean protein.  Do not eat a lot of foods high in solid fats, added sugars, or salt.  Maintain a Healthy Weight Regular exercise can help you achieve or maintain a healthy weight. You should:  Do at least 150 minutes of exercise each week. The exercise should increase your heart rate and make you sweat (moderate-intensity exercise).  Do strength-training exercises at least twice a week.  Watch Your Levels of Cholesterol and Blood Lipids  Have your  blood tested for lipids and cholesterol every 5 years starting at 66 years of age. If you are at high risk for heart disease, you should start having your blood tested when you are 66 years old. You may need to have your cholesterol levels checked more often if: ? Your lipid or cholesterol levels are high. ? You are older than 66 years of age. ? You are at high risk for heart disease.  What should I know about cancer screening? Many types of cancers can be detected early and may often be prevented. Lung Cancer  You should be screened every year for lung cancer if: ? You are a current smoker who has smoked for at least 30 years. ? You are a former smoker who has quit within the past 15 years.  Talk to your health care provider about your screening options, when you should start screening, and how often you should be screened.  Colorectal Cancer  Routine colorectal cancer screening usually begins at 66 years of age and should be repeated every 5-10 years until you are 66 years old. You may need to be screened more often if early forms of precancerous polyps or small growths are found. Your health care provider may recommend screening at an earlier age if you have risk factors for colon cancer.  Your health care provider may recommend using home test kits to check for hidden blood in the stool.  A small camera at the end of a tube can be used to examine your colon (sigmoidoscopy or colonoscopy). This  checks for the earliest forms of colorectal cancer.  Prostate and Testicular Cancer  Depending on your age and overall health, your health care provider may do certain tests to screen for prostate and testicular cancer.  Talk to your health care provider about any symptoms or concerns you have about testicular or prostate cancer.  Skin Cancer  Check your skin from head to toe regularly.  Tell your health care provider about any new moles or changes in moles, especially if: ? There is a  change in a mole's size, shape, or color. ? You have a mole that is larger than a pencil eraser.  Always use sunscreen. Apply sunscreen liberally and repeat throughout the day.  Protect yourself by wearing long sleeves, pants, a wide-brimmed hat, and sunglasses when outside.  What should I know about heart disease, diabetes, and high blood pressure?  If you are 32-65 years of age, have your blood pressure checked every 3-5 years. If you are 80 years of age or older, have your blood pressure checked every year. You should have your blood pressure measured twice-once when you are at a hospital or clinic, and once when you are not at a hospital or clinic. Record the average of the two measurements. To check your blood pressure when you are not at a hospital or clinic, you can use: ? An automated blood pressure machine at a pharmacy. ? A home blood pressure monitor.  Talk to your health care provider about your target blood pressure.  If you are between 72-25 years old, ask your health care provider if you should take aspirin to prevent heart disease.  Have regular diabetes screenings by checking your fasting blood sugar level. ? If you are at a normal weight and have a low risk for diabetes, have this test once every three years after the age of 41. ? If you are overweight and have a high risk for diabetes, consider being tested at a younger age or more often.  A one-time screening for abdominal aortic aneurysm (AAA) by ultrasound is recommended for men aged 13-75 years who are current or former smokers. What should I know about preventing infection? Hepatitis B If you have a higher risk for hepatitis B, you should be screened for this virus. Talk with your health care provider to find out if you are at risk for hepatitis B infection. Hepatitis C Blood testing is recommended for:  Everyone born from [birth year] through 1964.  Anyone with known risk factors for hepatitis C.  Sexually Transmitted  Diseases (STDs)  You should be screened each year for STDs including gonorrhea and chlamydia if: ? You are sexually active and are younger than 66 years of age. ? You are older than 66 years of age and your health care provider tells you that you are at risk for this type of infection. ? Your sexual activity has changed since you were last screened and you are at an increased risk for chlamydia or gonorrhea. Ask your health care provider if you are at risk.  Talk with your health care provider about whether you are at high risk of being infected with HIV. Your health care provider may recommend a prescription medicine to help prevent HIV infection.  What else can I do?  Schedule regular health, dental, and eye exams.  Stay current with your vaccines (immunizations).  Do not use any tobacco products, such as cigarettes, chewing tobacco, and e-cigarettes. If you need help quitting, ask your health care provider.  Limit alcohol intake to no more than 2 drinks per day. One drink equals 12 ounces of beer, 5 ounces of wine, or 1 ounces of hard liquor.  Do not use street drugs.  Do not share needles.  Ask your health care provider for help if you need support or information about quitting drugs.  Tell your health care provider if you often feel depressed.  Tell your health care provider if you have ever been abused or do not feel safe at home. This information is not intended to replace advice given to you by your health care provider. Make sure you discuss any questions you have with your health care provider. Document Released: 12/06/2007 Document Revised: 02/06/2016 Document Reviewed: 03/13/2015 Elsevier Interactive Patient Education  [employer].

## 2017-11-20 DIAGNOSIS — E785 Hyperlipidemia, unspecified: Secondary | ICD-10-CM | POA: Diagnosis not present

## 2017-11-20 DIAGNOSIS — I4891 Unspecified atrial fibrillation: Secondary | ICD-10-CM | POA: Diagnosis not present

## 2017-11-20 DIAGNOSIS — I259 Chronic ischemic heart disease, unspecified: Secondary | ICD-10-CM | POA: Diagnosis not present

## 2017-11-20 DIAGNOSIS — E663 Overweight: Secondary | ICD-10-CM | POA: Diagnosis not present

## 2017-11-20 DIAGNOSIS — G4733 Obstructive sleep apnea (adult) (pediatric): Secondary | ICD-10-CM | POA: Diagnosis not present

## 2017-11-23 DIAGNOSIS — C44622 Squamous cell carcinoma of skin of right upper limb, including shoulder: Secondary | ICD-10-CM | POA: Diagnosis not present

## 2017-11-26 ENCOUNTER — Ambulatory Visit: Payer: Self-pay

## 2017-12-07 DIAGNOSIS — C44622 Squamous cell carcinoma of skin of right upper limb, including shoulder: Secondary | ICD-10-CM | POA: Diagnosis not present

## 2017-12-10 ENCOUNTER — Other Ambulatory Visit: Payer: Self-pay | Admitting: Cardiovascular Disease

## 2017-12-10 NOTE — Telephone Encounter (Signed)
Please review for refill, Thanks !  

## 2017-12-10 NOTE — Telephone Encounter (Signed)
Pt last saw Dr Fletcher Anon 07/09/17, last labs 05/29/17 Creat 1.15, age 67, weight 81.3kg, CrCl 72.66, based on CrCl pt is on appropriate dosage of Xarelto 20mg  QD.  Will refill rx.

## 2018-01-04 ENCOUNTER — Ambulatory Visit (INDEPENDENT_AMBULATORY_CARE_PROVIDER_SITE_OTHER): Payer: Medicare Other | Admitting: Urology

## 2018-01-04 ENCOUNTER — Encounter: Payer: Self-pay | Admitting: Urology

## 2018-01-04 VITALS — BP 143/89 | HR 70 | Ht 66.0 in | Wt 180.0 lb

## 2018-01-04 DIAGNOSIS — R350 Frequency of micturition: Secondary | ICD-10-CM
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
## 2018-01-07 ENCOUNTER — Other Ambulatory Visit: Payer: Self-pay | Admitting: Cardiovascular Disease

## 2018-02-04 ENCOUNTER — Other Ambulatory Visit: Payer: Self-pay | Admitting: Cardiovascular Disease

## 2018-02-19 ENCOUNTER — Ambulatory Visit (INDEPENDENT_AMBULATORY_CARE_PROVIDER_SITE_OTHER): Payer: Medicare Other | Admitting: Family Medicine

## 2018-02-19 ENCOUNTER — Encounter: Payer: Self-pay | Admitting: Family Medicine

## 2018-02-19 ENCOUNTER — Other Ambulatory Visit: Payer: Self-pay

## 2018-02-19 ENCOUNTER — Telehealth: Payer: Self-pay | Admitting: Cardiovascular Disease

## 2018-02-19 ENCOUNTER — Telehealth: Payer: Self-pay | Admitting: Family

## 2018-02-19 VITALS — BP 146/84 | HR 80 | Temp 98.7°F | Wt 178.8 lb

## 2018-02-19 DIAGNOSIS — R630 Anorexia: Secondary | ICD-10-CM

## 2018-02-19 DIAGNOSIS — A09 Infectious gastroenteritis and colitis, unspecified: Secondary | ICD-10-CM

## 2018-02-19 DIAGNOSIS — R110 Nausea: Secondary | ICD-10-CM | POA: Diagnosis not present

## 2018-02-19 MED ORDER — ONDANSETRON 4 MG PO TBDP: 4.0000 mg | ORAL_TABLET | Freq: Three times a day (TID) | ORAL | 1 refills | Status: DC | PRN

## 2018-02-19 NOTE — Telephone Encounter (Signed)
Pt states he has had episodes that he has been sweating , no fever, but severe headaches. States he had to change clothes twice in the middle of the night. States yesterday at work he felt "clammy". States he has had a bad cough. He has taken Delsym for his cough. States he checked his HR 45 minutes ago and it was 137.   STAT if HR is under 50 or over 120 (normal HR is 60-100 beats per minute)  1) What is your heart rate? 123  2) Do you have a log of your heart rate readings (document readings)? Earlier this morning it was 137, 2 nights it was 108  3) Do you have any other symptoms? Clammy, sweating, severe headache.

## 2018-02-19 NOTE — Telephone Encounter (Signed)
Advised patient it would be ok to take the diltiazem 30 mg as needed. He said his HR is back down to 87 now. Patient has appointment with PCP this afternoon. No further concerns at this time.

## 2018-02-19 NOTE — Telephone Encounter (Signed)
This message was received at 4:51 but patient is requesting a prescription.

## 2018-02-19 NOTE — Progress Notes (Signed)
Subjective:    Patient ID: Dalton Sanders, male    DOB: Jan 14, 1951, 67 y.o.   MRN: [id number]  HPI   Patient presents to clinic due to nausea, decreased appetite, diarrhea off and on past 4 days.  States he recently completed a cross-country road trip with his wife, they stopped at different restaurants along the way and per patient the food was not very good and his stomach has not felt right since.  Reports one episode of sweating and chills, but no documented fever.  Denies any vomiting.  Denies any specific point tenderness in the abdomen.  States he has just not wanted to eat anything ever since getting back from his trip due to the terrible food they ate along the way.  Patient Active Problem List   Diagnosis Date Noted  . Asthma 11/02/2017  . Urinary problem 11/02/2017  . GERD (gastroesophageal reflux disease) 11/02/2017  . Unstable angina ([place])   . Hypertension   . Chest pain   . Carotid stenosis 03/31/2016  . Carotid stenosis 03/31/2016  . Paroxysmal atrial fibrillation ([place]) 02/02/2014  . Routine physical examination 02/01/2014  . SVT (supraventricular tachycardia) ([place]) 01/18/2014  . Hyperlipidemia 04/14/2013  . Elevated BP 04/14/2013   Social History   Tobacco Use  . Smoking status: Never Smoker  . Smokeless tobacco: Never Used  Substance Use Topics  . Alcohol use: No   Review of Systems  Constitutional: one episode of sweating and chills HENT: Negative for congestion, ear pain, sinus pain and sore throat.   Eyes: Negative.   Respiratory: Negative for cough, shortness of breath and wheezing.   Cardiovascular: Negative for chest pain, palpitations and leg swelling.  Gastrointestinal: positive for abdominal pain, diarrhea, nausea. No vomiting.  Genitourinary: Negative for dysuria, frequency and urgency.  Musculoskeletal: Negative for arthralgias and myalgias.  Skin: Negative for color change, pallor and rash.  Neurological: Negative for syncope,  light-headedness and headaches.  Psychiatric/Behavioral: The patient is not nervous/anxious.       Objective:   Physical Exam  Constitutional: He is oriented to person, place, and time. He appears well-developed and well-nourished. No distress.  HENT:  Head: Normocephalic and atraumatic.  Eyes: Conjunctivae and EOM are normal. No scleral icterus.  Neck: Neck supple. No JVD present.  Cardiovascular: Normal rate, regular rhythm and normal heart sounds.  Pulmonary/Chest: Effort normal and breath sounds normal. No respiratory distress.  Abdominal: Soft. Bowel sounds are normal. He exhibits no distension. There is no tenderness. There is no rebound and no guarding.  Musculoskeletal: Normal range of motion. He exhibits no edema.  Neurological: He is alert and oriented to person, place, and time.  Skin: Skin is warm and dry. He is not diaphoretic. No pallor.  Psychiatric: He has a normal mood and affect. His behavior is normal. Thought content normal.  Nursing note and vitals reviewed.     Vitals:   02/19/18 1426  BP: (!) 146/84  Pulse: 80  Temp: 98.7 F (37.1 C)  SpO2: 98%    Assessment & Plan:   Travelers diarrhea --due to abdominal symptoms coinciding with bad food patient reports eating on his trip, I suspect a travelers type diarrhea.  He will take short course of Cipro twice daily for 5 days.  Advised to eat bland foods and drink clear liquids to keep self hydrated.  Can slowly advance diet as tolerated.  Nausea- patient given prescription for Zofran to use as needed to help nausea calm down.  Advised he  can take a dose of Zofran 30 minutes prior to eating to help prepare stomach for food.  CBC and CMP drawn in clinic  Keep regularly scheduled follow-up as planned, return to clinic as needed if symptoms do not improve or worsen.

## 2018-02-19 NOTE — Telephone Encounter (Signed)
Copied from [place] [id number]. Topic: Quick Communication - See Telephone Encounter >> Feb 19, 2018  4:49 PM Blase Mess A wrote: CRM for notification. See Telephone encounter for: 02/19/18. Patient was in today and now is running a temperature that is 100.3 He would like to request a prescribtion.  He has taken some tynelol.  Patients call back number is [phone]

## 2018-02-19 NOTE — Telephone Encounter (Signed)
Patient calling back to let us know HR is 120's and he would like to know if he is supposed to take an extra 30 mg Diltiazem.  Please call to advise.
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
## 2018-02-20 DIAGNOSIS — R079 Chest pain, unspecified: Secondary | ICD-10-CM | POA: Diagnosis not present

## 2018-02-20 DIAGNOSIS — I4891 Unspecified atrial fibrillation: Secondary | ICD-10-CM | POA: Diagnosis not present

## 2018-02-20 DIAGNOSIS — Z5181 Encounter for therapeutic drug level monitoring: Secondary | ICD-10-CM | POA: Diagnosis not present

## 2018-02-20 DIAGNOSIS — Z79899 Other long term (current) drug therapy: Secondary | ICD-10-CM | POA: Diagnosis not present

## 2018-02-20 DIAGNOSIS — R61 Generalized hyperhidrosis: Secondary | ICD-10-CM | POA: Diagnosis not present

## 2018-02-20 DIAGNOSIS — I480 Paroxysmal atrial fibrillation: Secondary | ICD-10-CM | POA: Diagnosis not present

## 2018-02-20 DIAGNOSIS — I10 Essential (primary) hypertension: Secondary | ICD-10-CM | POA: Diagnosis not present

## 2018-02-20 DIAGNOSIS — R05 Cough: Secondary | ICD-10-CM | POA: Diagnosis not present

## 2018-02-20 DIAGNOSIS — I4581 Long QT syndrome: Secondary | ICD-10-CM | POA: Diagnosis not present

## 2018-02-20 DIAGNOSIS — R110 Nausea: Secondary | ICD-10-CM | POA: Diagnosis not present

## 2018-02-20 DIAGNOSIS — B349 Viral infection, unspecified: Secondary | ICD-10-CM | POA: Diagnosis not present

## 2018-02-20 DIAGNOSIS — R000 Tachycardia, unspecified: Secondary | ICD-10-CM | POA: Diagnosis not present

## 2018-02-20 DIAGNOSIS — Z7901 Long term (current) use of anticoagulants: Secondary | ICD-10-CM | POA: Diagnosis not present

## 2018-02-20 DIAGNOSIS — R509 Fever, unspecified: Secondary | ICD-10-CM | POA: Diagnosis not present

## 2018-02-20 DIAGNOSIS — R5381 Other malaise: Secondary | ICD-10-CM | POA: Diagnosis not present

## 2018-02-20 DIAGNOSIS — J069 Acute upper respiratory infection, unspecified: Secondary | ICD-10-CM | POA: Diagnosis not present

## 2018-02-20 LAB — COMPREHENSIVE METABOLIC PANEL
AG RATIO: 1.2 (calc) (ref 1.0–2.5)
ALBUMIN MSPROF: 3.8 g/dL (ref 3.6–5.1)
ALT: 36 U/L (ref 9–46)
AST: 43 U/L — ABNORMAL HIGH (ref 10–35)
Alkaline phosphatase (APISO): 68 U/L (ref 40–115)
BILIRUBIN TOTAL: 0.6 mg/dL (ref 0.2–1.2)
BUN/Creatinine Ratio: 11 (calc) (ref 6–22)
BUN: 17 mg/dL (ref 7–25)
CALCIUM: 8.8 mg/dL (ref 8.6–10.3)
CHLORIDE: 101 mmol/L (ref 98–110)
CO2: 27 mmol/L (ref 20–32)
Creat: 1.52 mg/dL — ABNORMAL HIGH (ref 0.70–1.25)
GLOBULIN: 3.2 g/dL (ref 1.9–3.7)
Glucose, Bld: 95 mg/dL (ref 65–99)
POTASSIUM: 4.3 mmol/L (ref 3.5–5.3)
SODIUM: 141 mmol/L (ref 135–146)
TOTAL PROTEIN: 7 g/dL (ref 6.1–8.1)

## 2018-02-20 LAB — CBC
HEMATOCRIT: 42.6 % (ref 38.5–50.0)
Hemoglobin: 15.2 g/dL (ref 13.2–17.1)
MCH: 32.3 pg (ref 27.0–33.0)
MCHC: 35.7 g/dL (ref 32.0–36.0)
MCV: 90.4 fL (ref 80.0–100.0)
MPV: 11 fL (ref 7.5–12.5)
Platelets: 205 10*3/uL (ref 140–400)
RBC: 4.71 10*6/uL (ref 4.20–5.80)
RDW: 13 % (ref 11.0–15.0)
WBC: 8.1 10*3/uL (ref 3.8–10.8)

## 2018-02-21 ENCOUNTER — Encounter: Payer: Self-pay | Admitting: Family Medicine

## 2018-02-21 MED ORDER — CIPROFLOXACIN HCL 500 MG PO TABS: 500.0000 mg | ORAL_TABLET | Freq: Two times a day (BID) | ORAL | 0 refills | Status: DC

## 2018-02-21 NOTE — Telephone Encounter (Signed)
I sent in 5 days of cipro - good med for GI type bacteria.  CBC is stable and CMP is also stable

## 2018-02-23 ENCOUNTER — Other Ambulatory Visit: Payer: Self-pay | Admitting: Cardiovascular Disease

## 2018-02-23 ENCOUNTER — Encounter: Payer: Self-pay | Admitting: Nurse Practitioner

## 2018-02-23 ENCOUNTER — Ambulatory Visit (INDEPENDENT_AMBULATORY_CARE_PROVIDER_SITE_OTHER): Payer: Medicare Other | Admitting: Nurse Practitioner

## 2018-02-23 VITALS — BP 144/70 | HR 67 | Ht 66.0 in | Wt 188.8 lb

## 2018-02-23 DIAGNOSIS — E782 Mixed hyperlipidemia: Secondary | ICD-10-CM

## 2018-02-23 DIAGNOSIS — I480 Paroxysmal atrial fibrillation: Secondary | ICD-10-CM

## 2018-02-23 DIAGNOSIS — I10 Essential (primary) hypertension: Secondary | ICD-10-CM
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
## 2018-02-23 NOTE — Patient Instructions (Signed)
Medication Instructions:  Your physician recommends that you continue on your current medications as directed. Please refer to the Current Medication list given to you today.   Labwork: NONE  Testing/Procedures: NONE  Follow-Up: Your physician recommends that you schedule a follow-up appointment in: 3-4 [place].   If you need a refill on your cardiac medications before your next appointment, please call your pharmacy.

## 2018-02-23 NOTE — Telephone Encounter (Signed)
Called patient to inform him that rx has been sent patient states that he picked up rx for cipro but did not take it because he went to [facility] on 02-20-18 and was told not to take the med because he was in Afib. Patient also states that he was told that he had a virus.

## 2018-03-02 ENCOUNTER — Ambulatory Visit: Payer: Medicare Other

## 2018-03-02 ENCOUNTER — Ambulatory Visit
Admission: EM | Admit: 2018-03-02 | Discharge: 2018-03-02 | Disposition: A | Discharge status: Home / Self Care | Payer: Medicare Other | Attending: Family Medicine | Admitting: Family Medicine

## 2018-03-02 ENCOUNTER — Other Ambulatory Visit: Payer: Self-pay

## 2018-03-02 ENCOUNTER — Encounter: Payer: Self-pay | Admitting: Emergency Medicine

## 2018-03-02 DIAGNOSIS — S99921A Unspecified injury of right foot, initial encounter: Secondary | ICD-10-CM | POA: Diagnosis not present

## 2018-03-02 DIAGNOSIS — S93601A Unspecified sprain of right foot, initial encounter: Secondary | ICD-10-CM | POA: Insufficient documentation

## 2018-03-02 DIAGNOSIS — M79671 Pain in right foot: Secondary | ICD-10-CM | POA: Diagnosis not present

## 2018-03-02 DIAGNOSIS — S82891A Other fracture of right lower leg, initial encounter for closed fracture: Secondary | ICD-10-CM | POA: Diagnosis not present

## 2018-03-02 DIAGNOSIS — S82892A Other fracture of left lower leg, initial encounter for closed fracture: Secondary | ICD-10-CM

## 2018-03-02 DIAGNOSIS — X58XXXA Exposure to other specified factors, initial encounter: Secondary | ICD-10-CM | POA: Insufficient documentation

## 2018-03-02 DIAGNOSIS — M25571 Pain in right ankle and joints of right foot: Secondary | ICD-10-CM | POA: Diagnosis present

## 2018-03-02 DIAGNOSIS — S8261XA Displaced fracture of lateral malleolus of right fibula, initial encounter for closed fracture: Secondary | ICD-10-CM | POA: Diagnosis not present

## 2018-03-02 DIAGNOSIS — S8251XA Displaced fracture of medial malleolus of right tibia, initial encounter for closed fracture: Secondary | ICD-10-CM | POA: Diagnosis not present
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
## 2018-03-02 NOTE — ED Triage Notes (Signed)
Pt c/o right ankle pain. He had a fall in the barn about a week ago. His pain has been worse the last 2 days. He is walking with a cane today and he does not normally.

## 2018-03-04 DIAGNOSIS — S82844A Nondisplaced bimalleolar fracture of right lower leg, initial encounter for closed fracture: Secondary | ICD-10-CM | POA: Diagnosis not present

## 2018-03-08 ENCOUNTER — Other Ambulatory Visit: Payer: Self-pay | Admitting: Family

## 2018-03-08 DIAGNOSIS — E785 Hyperlipidemia, unspecified: Secondary | ICD-10-CM

## 2018-03-22 DIAGNOSIS — D2262 Melanocytic nevi of left upper limb, including shoulder: Secondary | ICD-10-CM | POA: Diagnosis not present

## 2018-03-22 DIAGNOSIS — X32XXXA Exposure to sunlight, initial encounter: Secondary | ICD-10-CM | POA: Diagnosis not present

## 2018-03-22 DIAGNOSIS — L298 Other pruritus: Secondary | ICD-10-CM | POA: Diagnosis not present

## 2018-03-22 DIAGNOSIS — D2261 Melanocytic nevi of right upper limb, including shoulder: Secondary | ICD-10-CM | POA: Diagnosis not present

## 2018-03-22 DIAGNOSIS — D485 Neoplasm of uncertain behavior of skin: Secondary | ICD-10-CM | POA: Diagnosis not present

## 2018-03-22 DIAGNOSIS — D225 Melanocytic nevi of trunk: Secondary | ICD-10-CM | POA: Diagnosis not present

## 2018-03-22 DIAGNOSIS — D044 Carcinoma in situ of skin of scalp and neck: Secondary | ICD-10-CM | POA: Diagnosis not present

## 2018-03-22 DIAGNOSIS — D2272 Melanocytic nevi of left lower limb, including hip: Secondary | ICD-10-CM | POA: Diagnosis not present

## 2018-03-22 DIAGNOSIS — Z85828 Personal history of other malignant neoplasm of skin: Secondary | ICD-10-CM | POA: Diagnosis not present

## 2018-03-22 DIAGNOSIS — L538 Other specified erythematous conditions: Secondary | ICD-10-CM | POA: Diagnosis not present

## 2018-03-22 DIAGNOSIS — Z08 Encounter for follow-up examination after completed treatment for malignant neoplasm: Secondary | ICD-10-CM | POA: Diagnosis not present

## 2018-03-22 DIAGNOSIS — L570 Actinic keratosis: Secondary | ICD-10-CM | POA: Diagnosis not present

## 2018-03-22 DIAGNOSIS — D2271 Melanocytic nevi of right lower limb, including hip: Secondary | ICD-10-CM | POA: Diagnosis not present

## 2018-05-03 DIAGNOSIS — C4442 Squamous cell carcinoma of skin of scalp and neck: Secondary | ICD-10-CM | POA: Diagnosis not present

## 2018-05-03 DIAGNOSIS — L905 Scar conditions and fibrosis of skin: Secondary | ICD-10-CM | POA: Diagnosis not present

## 2018-05-14 ENCOUNTER — Encounter: Payer: Self-pay | Admitting: Cardiovascular Disease

## 2018-05-14 ENCOUNTER — Ambulatory Visit (INDEPENDENT_AMBULATORY_CARE_PROVIDER_SITE_OTHER): Payer: Medicare Other | Admitting: Cardiovascular Disease

## 2018-05-14 VITALS — BP 140/82 | HR 58 | Ht 66.0 in | Wt 185.8 lb

## 2018-05-14 DIAGNOSIS — I10 Essential (primary) hypertension: Secondary | ICD-10-CM | POA: Diagnosis not present

## 2018-05-14 DIAGNOSIS — I480 Paroxysmal atrial fibrillation: Secondary | ICD-10-CM | POA: Diagnosis not present

## 2018-05-14 DIAGNOSIS — I779 Disorder of arteries and arterioles, unspecified: Secondary | ICD-10-CM

## 2018-05-14 DIAGNOSIS — E785 Hyperlipidemia, unspecified: Secondary | ICD-10-CM

## 2018-05-14 DIAGNOSIS — I739 Peripheral vascular disease, unspecified: Secondary | ICD-10-CM
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
## 2018-05-14 NOTE — Patient Instructions (Signed)

## 2018-05-18 ENCOUNTER — Other Ambulatory Visit: Payer: Self-pay | Admitting: Cardiovascular Disease

## 2018-05-19 ENCOUNTER — Other Ambulatory Visit: Payer: Self-pay | Admitting: Cardiovascular Disease

## 2018-05-19 ENCOUNTER — Telehealth: Payer: Self-pay | Admitting: Radiology

## 2018-05-19 DIAGNOSIS — Z0000 Encounter for general adult medical examination without abnormal findings: Secondary | ICD-10-CM

## 2018-05-19 NOTE — Addendum Note (Signed)
Addended by: Burnard Hawthorne on: 05/19/2018 12:24 PM   Modules accepted: Orders

## 2018-05-19 NOTE — Telephone Encounter (Signed)
Pt is coming in for labs Monday, please place future orders. Thank you

## 2018-05-24 ENCOUNTER — Other Ambulatory Visit: Payer: Medicare Other

## 2018-05-24 ENCOUNTER — Encounter: Payer: Self-pay | Admitting: Family

## 2018-05-24 ENCOUNTER — Ambulatory Visit (INDEPENDENT_AMBULATORY_CARE_PROVIDER_SITE_OTHER): Payer: Medicare Other | Admitting: Family

## 2018-05-24 VITALS — BP 132/78 | HR 61 | Temp 97.6°F | Wt 183.6 lb

## 2018-05-24 DIAGNOSIS — K219 Gastro-esophageal reflux disease without esophagitis: Secondary | ICD-10-CM | POA: Diagnosis not present

## 2018-05-24 DIAGNOSIS — K625 Hemorrhage of anus and rectum: Secondary | ICD-10-CM | POA: Diagnosis not present

## 2018-05-24 DIAGNOSIS — I480 Paroxysmal atrial fibrillation: Secondary | ICD-10-CM

## 2018-05-24 DIAGNOSIS — R3989 Other symptoms and signs involving the genitourinary system: Secondary | ICD-10-CM | POA: Diagnosis not present

## 2018-05-24 DIAGNOSIS — I10 Essential (primary) hypertension: Secondary | ICD-10-CM | POA: Diagnosis not present

## 2018-05-24 LAB — COMPREHENSIVE METABOLIC PANEL
ALK PHOS: 70 U/L (ref 39–117)
ALT: 28 U/L (ref 0–53)
AST: 25 U/L (ref 0–37)
Albumin: 4.5 g/dL (ref 3.5–5.2)
BILIRUBIN TOTAL: 0.6 mg/dL (ref 0.2–1.2)
BUN: 13 mg/dL (ref 6–23)
CO2: 27 mEq/L (ref 19–32)
CREATININE: 1.24 mg/dL (ref 0.40–1.50)
Calcium: 9.8 mg/dL (ref 8.4–10.5)
Chloride: 104 mEq/L (ref 96–112)
GFR: 61.9 mL/min (ref >60.00)
GLUCOSE: 103 mg/dL — AB (ref 70–99)
Potassium: 5 mEq/L (ref 3.5–5.1)
Sodium: 141 mEq/L (ref 135–145)
TOTAL PROTEIN: 7.3 g/dL (ref 6.0–8.3)

## 2018-05-24 LAB — CBC WITH DIFFERENTIAL/PLATELET
BASOS ABS: 0 10*3/uL (ref 0.0–0.1)
Basophils Relative: 0.5 % (ref 0.0–3.0)
Eosinophils Absolute: 0.4 10*3/uL (ref 0.0–0.7)
Eosinophils Relative: 5.8 % — ABNORMAL HIGH (ref 0.0–5.0)
HCT: 42.7 % (ref 39.0–52.0)
Hemoglobin: 14.5 g/dL (ref 13.0–17.0)
LYMPHS PCT: 40.3 % (ref 12.0–46.0)
Lymphs Abs: 3.1 10*3/uL (ref 0.7–4.0)
MCHC: 33.9 g/dL (ref 30.0–36.0)
MCV: 96.1 fl (ref 78.0–100.0)
MONOS PCT: 9.5 % (ref 3.0–12.0)
Monocytes Absolute: 0.7 10*3/uL (ref 0.1–1.0)
NEUTROS PCT: 43.9 % (ref 43.0–77.0)
Neutro Abs: 3.4 10*3/uL (ref 1.4–7.7)
Platelets: 251 10*3/uL (ref 150.0–400.0)
RBC: 4.44 Mil/uL (ref 4.22–5.81)
RDW: 14.6 % (ref 11.5–15.5)
WBC: 7.7 10*3/uL (ref 4.0–10.5)

## 2018-05-24 LAB — PSA: PSA: 1.86 ng/mL (ref 0.10–4.00)

## 2018-05-24 NOTE — Patient Instructions (Addendum)
Stool cards- please return to office.   Please let me know if you see ANY blood in stool-as this is concerning.   Today we discussed referrals, orders. GASTROENTEROLOGY   I have placed these orders in the system for you.  Please be sure to give [place] a call if you have not heard from our office regarding this. We should hear from [place] within ONE week with information regarding your appointment. If not, please let me know immediately.

## 2018-05-24 NOTE — Assessment & Plan Note (Signed)
Urinary frequency is not particulaly bothersome for patient.  He would like to follow annually with PSA.  He does not feel the need to follow-up with urology at this time.  We will continue to discuss.
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
## 2018-05-24 NOTE — Assessment & Plan Note (Signed)
No obvious hemorrhoids appreciated on exam.  Negative guaic in the office.  Will Send patient home with stool cards as well.  Discussed referral to gastroenterology for further evaluation of rectal bleeding, possibly earlier colonoscopy.  Will follow

## 2018-05-24 NOTE — Assessment & Plan Note (Signed)
Concern with longstanding use of PPIs, no recent EGD.  Referral to GI for further evaluation.

## 2018-05-24 NOTE — Assessment & Plan Note (Signed)
At goal.  Continue regimen. 

## 2018-05-24 NOTE — Assessment & Plan Note (Signed)
Symptomatically stable. Follows with Dr Arida 

## 2018-06-01 ENCOUNTER — Other Ambulatory Visit: Payer: Self-pay | Admitting: Cardiovascular Disease

## 2018-06-03 ENCOUNTER — Other Ambulatory Visit: Payer: Self-pay | Admitting: Cardiovascular Disease

## 2018-06-03 NOTE — Telephone Encounter (Signed)
Refill Request.  

## 2018-06-04 ENCOUNTER — Other Ambulatory Visit (INDEPENDENT_AMBULATORY_CARE_PROVIDER_SITE_OTHER): Payer: Medicare Other

## 2018-06-04 DIAGNOSIS — K625 Hemorrhage of anus and rectum: Secondary | ICD-10-CM

## 2018-06-04 LAB — FECAL OCCULT BLOOD, IMMUNOCHEMICAL: Fecal Occult Bld: NEGATIVE

## 2018-06-07 DIAGNOSIS — K219 Gastro-esophageal reflux disease without esophagitis: Secondary | ICD-10-CM | POA: Diagnosis not present

## 2018-06-07 DIAGNOSIS — K625 Hemorrhage of anus and rectum: Secondary | ICD-10-CM | POA: Diagnosis not present

## 2018-06-10 ENCOUNTER — Other Ambulatory Visit (INDEPENDENT_AMBULATORY_CARE_PROVIDER_SITE_OTHER): Payer: Medicare Other

## 2018-06-10 DIAGNOSIS — Z0000 Encounter for general adult medical examination without abnormal findings: Secondary | ICD-10-CM

## 2018-06-10 LAB — LIPID PANEL
CHOLESTEROL: 148 mg/dL (ref 0–200)
HDL: 51.6 mg/dL (ref >39.00)
NonHDL: 96.43
TRIGLYCERIDES: 207 mg/dL — AB (ref 0.0–149.0)
Total CHOL/HDL Ratio: 3
VLDL: 41.4 mg/dL — AB (ref 0.0–40.0)

## 2018-06-10 LAB — LDL CHOLESTEROL, DIRECT: Direct LDL: 74 mg/dL

## 2018-08-24 ENCOUNTER — Other Ambulatory Visit: Payer: Self-pay | Admitting: Cardiovascular Disease

## 2018-08-24 DIAGNOSIS — E785 Hyperlipidemia, unspecified: Secondary | ICD-10-CM

## 2018-08-27 ENCOUNTER — Other Ambulatory Visit: Payer: Self-pay | Admitting: Family

## 2018-08-27 DIAGNOSIS — E785 Hyperlipidemia, unspecified: Secondary | ICD-10-CM

## 2018-10-18 ENCOUNTER — Other Ambulatory Visit: Payer: Self-pay

## 2018-10-18 ENCOUNTER — Encounter: Payer: Self-pay | Admitting: Family

## 2018-10-18 ENCOUNTER — Telehealth: Payer: Self-pay

## 2018-10-18 ENCOUNTER — Ambulatory Visit (INDEPENDENT_AMBULATORY_CARE_PROVIDER_SITE_OTHER): Payer: Medicare Other | Admitting: Family

## 2018-10-18 DIAGNOSIS — I10 Essential (primary) hypertension: Secondary | ICD-10-CM

## 2018-10-18 DIAGNOSIS — E7800 Pure hypercholesterolemia, unspecified: Secondary | ICD-10-CM

## 2018-10-18 DIAGNOSIS — K219 Gastro-esophageal reflux disease without esophagitis: Secondary | ICD-10-CM

## 2018-10-18 DIAGNOSIS — K625 Hemorrhage of anus and rectum: Secondary | ICD-10-CM

## 2018-10-18 NOTE — Telephone Encounter (Signed)
Copied from [address] [id number]. Topic: Appointment Scheduling - Scheduling Inquiry for Clinic >> Oct 18, 2018  7:44 AM Scherrie Gerlach wrote: Reason for CRM: pt has 8 am appt. pt wants you to know he has a flip phone and no camera on his computer. Pt states it will have to be a telephone call if anyone reaches out to him, it will have to be via telephone. [phone]

## 2018-10-18 NOTE — Patient Instructions (Addendum)
You are due for pneumonia vaccine; please consider this when you come for labs or follow up.   Please make fasting lab appointment  Let me know if you need anything at all!

## 2018-10-18 NOTE — Assessment & Plan Note (Signed)
Resolved  Will monitor

## 2018-10-18 NOTE — Progress Notes (Signed)
Printed & mailed.  

## 2018-10-18 NOTE — Assessment & Plan Note (Signed)
Symptoms controlled, patient states he will continue to follow with GI; EGD/Colonoscopy 2021.

## 2018-10-18 NOTE — Telephone Encounter (Signed)
Patient had appointment this morning.

## 2018-10-18 NOTE — Assessment & Plan Note (Signed)
Discussed increase in triglycerides, patient suspects dietary influence. pending lipid panel to monitor trend.

## 2018-10-18 NOTE — Progress Notes (Signed)
Verbal consent for services obtained from patient prior to services given.  Location of call:  provider at work patient at home  Names of all persons present for services: Mable Paris, NP  Feeling well today. No new complaints.   Rectal bleeding/gerd- Bleeding has resolved and prescribed with omeprazole; no epigastric burning, trouble swallowing.    colononscopy due 2021; has seen [facility] 05/2018  HTN- at home, 120/77. Denies exertional chest pain or pressure, numbness or tingling radiating to left arm or jaw, palpitations, dizziness, frequent headaches, changes in vision, or shortness of breath.    Urinary frequency- has seen urologist per patient; unable to see notes; no concerns for prostate  HLD- compliant with medication.  Endorses eating Halloween candy around the time lab drawn.  History, background, results pertinent:  Never smoker.   A/P/next steps:  Problem List Items Addressed This Visit      Cardiovascular and Mediastinum   Hypertension    Well controlled. Continue regimen.         Digestive   GERD (gastroesophageal reflux disease)    Symptoms controlled, patient states he will continue to follow with GI; EGD/Colonoscopy 2021.       Rectal bleeding    Resolved. Will monitor.         Other   Hyperlipidemia - Primary    Discussed increase in triglycerides, patient suspects dietary influence. pending lipid panel to monitor trend.      Relevant Orders   Lipid panel      I spent 15 min  discussing plan of care over the phone.

## 2018-10-18 NOTE — Assessment & Plan Note (Signed)
Well controlled. Continue regimen.

## 2018-10-25 ENCOUNTER — Other Ambulatory Visit (INDEPENDENT_AMBULATORY_CARE_PROVIDER_SITE_OTHER): Payer: Medicare Other

## 2018-10-25 ENCOUNTER — Other Ambulatory Visit: Payer: Self-pay

## 2018-10-25 DIAGNOSIS — E7800 Pure hypercholesterolemia, unspecified: Secondary | ICD-10-CM | POA: Diagnosis not present

## 2018-10-25 LAB — LIPID PANEL
Cholesterol: 139 mg/dL (ref 0–200)
HDL: 49.3 mg/dL (ref >39.00)
LDL Cholesterol: 56 mg/dL (ref 0–99)
NonHDL: 89.25
Total CHOL/HDL Ratio: 3
Triglycerides: 167 mg/dL — ABNORMAL HIGH (ref 0.0–149.0)
VLDL: 33.4 mg/dL (ref 0.0–40.0)

## 2019-02-01 ENCOUNTER — Ambulatory Visit (INDEPENDENT_AMBULATORY_CARE_PROVIDER_SITE_OTHER): Payer: Medicare Other

## 2019-02-01 ENCOUNTER — Other Ambulatory Visit: Payer: Self-pay

## 2019-02-01 DIAGNOSIS — Z0000 Encounter for general adult medical examination without abnormal findings: Secondary | ICD-10-CM | POA: Diagnosis not present
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
## 2019-02-01 NOTE — Patient Instructions (Addendum)
  Dalton Sanders , Thank you for taking time to come for your Medicare Wellness Visit. I appreciate your ongoing commitment to your health goals. Please review the following plan we discussed and let me know if I can assist you in the future.   These are the goals we discussed: Goals    . Follow up with Primary Care Provider     Keep all routine maintenance appointments       This is a list of the screening recommended for you and due dates:  Health Maintenance  Topic Date Due  . Pneumonia vaccines (1 of 2 - PCV13) 12/03/2016  . Flu Shot  01/22/2019  . Colon Cancer Screening  12/08/2019  . Tetanus Vaccine  07/03/2025  .  Hepatitis C: One time screening is recommended by Center for Disease Control  (CDC) for  adults born from [birth year] through 1964.   Completed

## 2019-02-04 ENCOUNTER — Other Ambulatory Visit: Payer: Self-pay

## 2019-02-04 ENCOUNTER — Ambulatory Visit (INDEPENDENT_AMBULATORY_CARE_PROVIDER_SITE_OTHER): Payer: Medicare Other | Admitting: Family

## 2019-02-04 DIAGNOSIS — M25552 Pain in left hip: Secondary | ICD-10-CM | POA: Diagnosis not present
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
## 2019-02-04 NOTE — Assessment & Plan Note (Addendum)
Acute on chronic.  Radicular symptoms present .unable to use steroids as has precipitated atrial fib in the past.  Pending imaging. Discussed Trial of gabapentin however declines as feels 'functioning' and not so bothersome.

## 2019-02-04 NOTE — Patient Instructions (Addendum)
Today we discussed referrals, orders. MRI   I have placed these orders in the system for you.  Please be sure to give [place] a call if you have not heard from our office regarding this. We should hear from [place] within ONE week with information regarding your appointment. If not, please let me know immediately.   Please consider gabapentin.   Follow up in one month.    Radicular Pain Radicular pain is a type of pain that spreads from your back or neck along a spinal nerve. Spinal nerves are nerves that leave the spinal cord and go to the muscles. Radicular pain is sometimes called radiculopathy, radiculitis, or a pinched nerve. When you have this type of pain, you may also have weakness, numbness, or tingling in the area of your body that is supplied by the nerve. The pain may feel sharp and burning. Depending on which spinal nerve is affected, the pain may occur in the:  Neck area (cervical radicular pain). You may also feel pain, numbness, weakness, or tingling in the arms.  Mid-spine area (thoracic radicular pain). You would feel this pain in the back and chest. This type is rare.  Lower back area (lumbar radicular pain). You would feel this pain as low back pain. You may feel pain, numbness, weakness, or tingling in the buttocks or legs. Sciatica is a type of lumbar radicular pain that shoots down the back of the leg. Radicular pain occurs when one of the spinal nerves becomes irritated or squeezed (compressed). It is often caused by something pushing on a spinal nerve, such as one of the bones of the spine (vertebrae) or one of the round cushions between vertebrae (intervertebral disks). This can result from:  An injury.  Wear and tear or aging of a disk.  The growth of a bone spur that pushes on the nerve. Radicular pain often goes away when you follow instructions from your health care provider for relieving pain at home. Follow these instructions at home: Managing pain      If  directed, put ice on the affected area: ? Put ice in a plastic bag. ? Place a towel between your skin and the bag. ? Leave the ice on for 20 minutes, 2-3 times a day.  If directed, apply heat to the affected area as often as told by your health care provider. Use the heat source that your health care provider recommends, such as a moist heat pack or a heating pad. ? Place a towel between your skin and the heat source. ? Leave the heat on for 20-30 minutes. ? Remove the heat if your skin turns bright red. This is especially important if you are unable to feel pain, heat, or cold. You may have a greater risk of getting burned. Activity   Do not sit or rest in bed for long periods of time.  Try to stay as active as possible. Ask your health care provider what type of exercise or activity is best for you.  Avoid activities that make your pain worse, such as bending and lifting.  Do not lift anything that is heavier than 10 lb (4.5 kg), or the limit that you are told, until your health care provider says that it is safe.  Practice using proper technique when lifting items. Proper lifting technique involves bending your knees and rising up.  Do strength and range-of-motion exercises only as told by your health care provider or physical therapist. General instructions  Take over-the-counter  and prescription medicines only as told by your health care provider.  Pay attention to any changes in your symptoms.  Keep all follow-up visits as told by your health care provider. This is important. ? Your health care provider may send you to a physical therapist to help with this pain. Contact a health care provider if:  Your pain and other symptoms get worse.  Your pain medicine is not helping.  Your pain has not improved after a few weeks of home care.  You have a fever. Get help right away if:  You have severe pain, weakness, or numbness.  You have difficulty with bladder or bowel  control. Summary  Radicular pain is a type of pain that spreads from your back or neck along a spinal nerve.  When you have radicular pain, you may also have weakness, numbness, or tingling in the area of your body that is supplied by the nerve.  The pain may feel sharp or burning.  Radicular pain may be treated with ice, heat, medicines, or physical therapy. This information is not intended to replace advice given to you by your health care provider. Make sure you discuss any questions you have with your health care provider. Document Released: 07/17/2004 Document Revised: 12/22/2017 Document Reviewed: 12/22/2017 Elsevier Patient Education  2020 [employer].

## 2019-02-09 ENCOUNTER — Other Ambulatory Visit: Payer: Self-pay

## 2019-02-09 ENCOUNTER — Encounter: Payer: Self-pay | Admitting: Nurse Practitioner

## 2019-02-09 ENCOUNTER — Other Ambulatory Visit
Admission: RE | Admit: 2019-02-09 | Discharge: 2019-02-09 | Disposition: A | Discharge status: Home / Self Care | Payer: Medicare Other | Source: Ambulatory Visit | Attending: Nurse Practitioner | Admitting: Nurse Practitioner

## 2019-02-09 ENCOUNTER — Encounter: Payer: Self-pay | Admitting: *Deleted

## 2019-02-09 ENCOUNTER — Ambulatory Visit (INDEPENDENT_AMBULATORY_CARE_PROVIDER_SITE_OTHER): Payer: Medicare Other | Admitting: Nurse Practitioner

## 2019-02-09 VITALS — BP 140/80 | HR 60 | Ht 69.0 in | Wt 179.0 lb

## 2019-02-09 DIAGNOSIS — I480 Paroxysmal atrial fibrillation: Secondary | ICD-10-CM | POA: Insufficient documentation

## 2019-02-09 DIAGNOSIS — I6523 Occlusion and stenosis of bilateral carotid arteries: Secondary | ICD-10-CM

## 2019-02-09 DIAGNOSIS — E782 Mixed hyperlipidemia: Secondary | ICD-10-CM | POA: Diagnosis not present

## 2019-02-09 DIAGNOSIS — I10 Essential (primary) hypertension: Secondary | ICD-10-CM | POA: Diagnosis not present

## 2019-02-09 LAB — CBC WITH DIFFERENTIAL/PLATELET
Abs Immature Granulocytes: 0.02 10*3/uL (ref 0.00–0.07)
Basophils Absolute: 0.1 10*3/uL (ref 0.0–0.1)
Basophils Relative: 1 %
Eosinophils Absolute: 0.5 10*3/uL (ref 0.0–0.5)
Eosinophils Relative: 6 %
HCT: 42.9 % (ref 39.0–52.0)
Hemoglobin: 14.5 g/dL (ref 13.0–17.0)
Immature Granulocytes: 0 %
Lymphocytes Relative: 41 %
Lymphs Abs: 3.5 10*3/uL (ref 0.7–4.0)
MCH: 32 pg (ref 26.0–34.0)
MCHC: 33.8 g/dL (ref 30.0–36.0)
MCV: 94.7 fL (ref 80.0–100.0)
Monocytes Absolute: 0.8 10*3/uL (ref 0.1–1.0)
Monocytes Relative: 10 %
Neutro Abs: 3.5 10*3/uL (ref 1.7–7.7)
Neutrophils Relative %: 42 %
Platelets: 253 10*3/uL (ref 150–400)
RBC: 4.53 MIL/uL (ref 4.22–5.81)
RDW: 13.3 % (ref 11.5–15.5)
WBC: 8.4 10*3/uL (ref 4.0–10.5)
nRBC: 0 % (ref 0.0–0.2)

## 2019-02-09 LAB — BASIC METABOLIC PANEL
Anion gap: 9 (ref 5–15)
BUN: 17 mg/dL (ref 8–23)
CO2: 27 mmol/L (ref 22–32)
Calcium: 9.9 mg/dL (ref 8.9–10.3)
Chloride: 104 mmol/L (ref 98–111)
Creatinine, Ser: 1.25 mg/dL — ABNORMAL HIGH (ref 0.61–1.24)
GFR calc Af Amer: >60 mL/min (ref >60)
GFR calc non Af Amer: 59 mL/min — ABNORMAL LOW (ref >60)
Glucose, Bld: 125 mg/dL — ABNORMAL HIGH (ref 70–99)
Potassium: 5.1 mmol/L (ref 3.5–5.1)
Sodium: 140 mmol/L (ref 135–145)

## 2019-02-09 NOTE — Patient Instructions (Signed)
Medication Instructions:  Your physician recommends that you continue on your current medications as directed. Please refer to the Current Medication list given to you today.  If you need a refill on your cardiac medications before your next appointment, please call your pharmacy.   Lab work: Art gallery manager, Psychologist, occupational today If you have labs (blood work) drawn today and your tests are completely normal, you will receive your results only by: Marland Kitchen MyChart Message (if you have MyChart) OR . A paper copy in the mail If you have any lab test that is abnormal or we need to change your treatment, we will call you to review the results.  Testing/Procedures: Your physician has requested that you have a carotid duplex. This test is an ultrasound of the carotid arteries in your neck. It looks at blood flow through these arteries that supply the brain with blood. Allow one hour for this exam. There are no restrictions or special instructions.    Follow-Up: At [facility], you and your health needs are our priority.  As part of our continuing mission to provide you with exceptional heart care, we have created designated Provider Care Teams.  These Care Teams include your primary Cardiologist (physician) and Advanced Practice Providers (APPs -  Physician Assistants and Nurse Practitioners) who all work together to provide you with the care you need, when you need it. You will need a follow up appointment in 6 months.  Please call our office 2 months in advance to schedule this appointment.  You may see Kathlyn Sacramento, MD or one of the following Advanced Practice Providers on your designated Care Team:   Murray Hodgkins, NP Christell Faith, PA-C . Marrianne Mood, PA-C  Any Other Special Instructions Will Be Listed Below (If Applicable). N/A
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
## 2019-02-14 DIAGNOSIS — Z85828 Personal history of other malignant neoplasm of skin: Secondary | ICD-10-CM | POA: Diagnosis not present

## 2019-02-14 DIAGNOSIS — D485 Neoplasm of uncertain behavior of skin: Secondary | ICD-10-CM | POA: Diagnosis not present

## 2019-02-14 DIAGNOSIS — D2261 Melanocytic nevi of right upper limb, including shoulder: Secondary | ICD-10-CM | POA: Diagnosis not present

## 2019-02-14 DIAGNOSIS — D2262 Melanocytic nevi of left upper limb, including shoulder: Secondary | ICD-10-CM | POA: Diagnosis not present

## 2019-02-14 DIAGNOSIS — L570 Actinic keratosis: Secondary | ICD-10-CM | POA: Diagnosis not present

## 2019-02-14 DIAGNOSIS — X32XXXA Exposure to sunlight, initial encounter: Secondary | ICD-10-CM | POA: Diagnosis not present

## 2019-02-14 DIAGNOSIS — L239 Allergic contact dermatitis, unspecified cause: Secondary | ICD-10-CM | POA: Diagnosis not present

## 2019-02-14 DIAGNOSIS — D225 Melanocytic nevi of trunk: Secondary | ICD-10-CM | POA: Diagnosis not present

## 2019-02-14 DIAGNOSIS — L821 Other seborrheic keratosis: Secondary | ICD-10-CM | POA: Diagnosis not present

## 2019-02-22 ENCOUNTER — Ambulatory Visit
Admission: RE | Admit: 2019-02-22 | Discharge: 2019-02-22 | Disposition: A | Discharge status: Home / Self Care | Payer: Medicare Other | Source: Ambulatory Visit | Attending: Family | Admitting: Family

## 2019-02-22 ENCOUNTER — Other Ambulatory Visit: Payer: Self-pay

## 2019-02-22 DIAGNOSIS — M25552 Pain in left hip: Secondary | ICD-10-CM | POA: Diagnosis not present

## 2019-02-22 DIAGNOSIS — M5127 Other intervertebral disc displacement, lumbosacral region: Secondary | ICD-10-CM | POA: Diagnosis not present

## 2019-02-23 ENCOUNTER — Other Ambulatory Visit: Payer: Self-pay

## 2019-02-23 ENCOUNTER — Other Ambulatory Visit: Payer: Self-pay | Admitting: Family

## 2019-02-23 DIAGNOSIS — E785 Hyperlipidemia, unspecified: Secondary | ICD-10-CM

## 2019-02-23 MED ORDER — DILTIAZEM HCL ER COATED BEADS 240 MG PO CP24: ORAL_CAPSULE | ORAL | 3 refills | Status: DC

## 2019-02-23 MED ORDER — FLECAINIDE ACETATE 50 MG PO TABS: 50.0000 mg | ORAL_TABLET | Freq: Two times a day (BID) | ORAL | 2 refills | Status: DC

## 2019-02-24 ENCOUNTER — Telehealth: Payer: Self-pay

## 2019-02-24 ENCOUNTER — Ambulatory Visit (INDEPENDENT_AMBULATORY_CARE_PROVIDER_SITE_OTHER): Payer: Medicare Other

## 2019-02-24 ENCOUNTER — Other Ambulatory Visit: Payer: Self-pay

## 2019-02-24 DIAGNOSIS — I6523 Occlusion and stenosis of bilateral carotid arteries: Secondary | ICD-10-CM

## 2019-02-24 NOTE — Telephone Encounter (Signed)
Call to patient to review results of carotid u/s. NO new orders. Pt verbalized understanding and cooperative with POC.  Advised pt to call for any further questions or concerns.

## 2019-02-24 NOTE — Telephone Encounter (Signed)
-----   Message from Theora Gianotti, NP sent at 02/24/2019  9:43 AM EDT ----- Mild, stable, bilateral carotid dzs. F/u in 2 yrs.

## 2019-03-01 ENCOUNTER — Other Ambulatory Visit: Payer: Self-pay | Admitting: Family

## 2019-03-01 DIAGNOSIS — M25552 Pain in left hip: Secondary | ICD-10-CM

## 2019-03-02 DIAGNOSIS — L570 Actinic keratosis: Secondary | ICD-10-CM | POA: Diagnosis not present

## 2019-03-02 DIAGNOSIS — X32XXXA Exposure to sunlight, initial encounter: Secondary | ICD-10-CM | POA: Diagnosis not present

## 2019-03-28 DIAGNOSIS — M47816 Spondylosis without myelopathy or radiculopathy, lumbar region: Secondary | ICD-10-CM | POA: Diagnosis not present

## 2019-03-28 DIAGNOSIS — M5126 Other intervertebral disc displacement, lumbar region: Secondary | ICD-10-CM | POA: Diagnosis not present

## 2019-05-13 ENCOUNTER — Other Ambulatory Visit: Payer: Self-pay | Admitting: Cardiovascular Disease

## 2019-05-13 MED ORDER — RIVAROXABAN 20 MG PO TABS: ORAL_TABLET | ORAL | 1 refills | Status: DC

## 2019-05-13 NOTE — Telephone Encounter (Signed)
Please review for refill, Thanks !  

## 2019-05-13 NOTE — Telephone Encounter (Signed)
Scr 1.25 on 02/09/2019 Last OV 02/09/2019 crcl 65 ml/min  Xarelto 20mg  sent to pharmacy

## 2019-05-13 NOTE — Telephone Encounter (Signed)
°*  STAT* If patient is at the pharmacy, call can be transferred to refill team.   1. Which medications need to be refilled? (please list name of each medication and dose if known)  Xarelto 20 mg po q d   2. Which pharmacy/location (including street and city if local pharmacy) is medication to be sent to? [facility]   3. Do they need a 30 day or 90 day supply? 90   PATIENT MISSED A DOSE

## 2019-05-13 NOTE — Telephone Encounter (Signed)
This is a [facility] pt 

## 2019-08-12 ENCOUNTER — Other Ambulatory Visit: Payer: Self-pay

## 2019-08-12 ENCOUNTER — Ambulatory Visit (INDEPENDENT_AMBULATORY_CARE_PROVIDER_SITE_OTHER): Payer: Medicare Other | Admitting: Cardiovascular Disease

## 2019-08-12 ENCOUNTER — Encounter: Payer: Self-pay | Admitting: Cardiovascular Disease

## 2019-08-12 VITALS — BP 138/60 | HR 68 | Ht 66.0 in | Wt 185.8 lb

## 2019-08-12 DIAGNOSIS — E7800 Pure hypercholesterolemia, unspecified: Secondary | ICD-10-CM

## 2019-08-12 DIAGNOSIS — I480 Paroxysmal atrial fibrillation: Secondary | ICD-10-CM | POA: Diagnosis not present

## 2019-08-12 DIAGNOSIS — I10 Essential (primary) hypertension: Secondary | ICD-10-CM | POA: Diagnosis not present

## 2019-08-12 NOTE — Patient Instructions (Signed)
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
## 2019-08-15 ENCOUNTER — Other Ambulatory Visit: Payer: Self-pay | Admitting: Family

## 2019-08-15 DIAGNOSIS — E785 Hyperlipidemia, unspecified: Secondary | ICD-10-CM

## 2019-09-07 DIAGNOSIS — Z86010 Personal history of colonic polyps: Secondary | ICD-10-CM | POA: Diagnosis not present

## 2019-09-07 DIAGNOSIS — K219 Gastro-esophageal reflux disease without esophagitis: Secondary | ICD-10-CM | POA: Diagnosis not present

## 2019-09-08 ENCOUNTER — Telehealth: Payer: Self-pay | Admitting: Cardiovascular Disease

## 2019-09-08 NOTE — Telephone Encounter (Signed)
   Primary Cardiologist: Kathlyn Sacramento, MD  Chart reviewed as part of pre-operative protocol coverage. Patient was doing well on cardiac stand point when seen by [facility] 08/12/19. Given past medical history and time since last visit, based on ACC/AHA guidelines, JOHNERIC GOLEY would be at acceptable risk for the planned procedure without further cardiovascular testing.   Pharmacy to review anticoagulation.   [employer], [place] 09/08/2019, 9:00 AM

## 2019-09-08 NOTE — Telephone Encounter (Signed)
   [facility] Pre-operative Risk Assessment    Request for surgical clearance:  1. What type of surgery is being performed? COLONOSCOPY  2. When is this surgery scheduled? 01/02/20  3. What type of clearance is required (medical clearance vs. Pharmacy clearance to hold med vs. Both)? BOTH  4. Are there any medications that need to be held prior to surgery and how long? [place]  5. Practice name and name of physician performing surgery? [facility]  6. What is your office phone number [phone]   7.   What is your office fax number [phone]  8.   Anesthesia type (None, local, MAC, general) ? Duayne Cal 09/08/2019, 8:46 AM  _________________________________________________________________   (provider comments below)

## 2019-09-08 NOTE — Telephone Encounter (Signed)
Patient with diagnosis of atrial fibrillation on Xarelto for anticoagulation.    Procedure: Colonoscopy Date of procedure: 01/02/20  CHADS2-VASc score of 3 (HTN, AGE, CAD)  CrCl 68 ml/min Platelet count 253  Per office protocol, patient can hold Xarelto for 1-2 day prior to procedure.

## 2019-11-16 ENCOUNTER — Other Ambulatory Visit: Payer: Self-pay | Admitting: Cardiovascular Disease

## 2019-11-24 DIAGNOSIS — D2272 Melanocytic nevi of left lower limb, including hip: Secondary | ICD-10-CM | POA: Diagnosis not present

## 2019-11-24 DIAGNOSIS — X32XXXA Exposure to sunlight, initial encounter: Secondary | ICD-10-CM | POA: Diagnosis not present

## 2019-11-24 DIAGNOSIS — D2262 Melanocytic nevi of left upper limb, including shoulder: Secondary | ICD-10-CM | POA: Diagnosis not present

## 2019-11-24 DIAGNOSIS — D485 Neoplasm of uncertain behavior of skin: Secondary | ICD-10-CM | POA: Diagnosis not present

## 2019-11-24 DIAGNOSIS — L570 Actinic keratosis: Secondary | ICD-10-CM | POA: Diagnosis not present

## 2019-11-24 DIAGNOSIS — D225 Melanocytic nevi of trunk: Secondary | ICD-10-CM | POA: Diagnosis not present

## 2019-11-24 DIAGNOSIS — Z85828 Personal history of other malignant neoplasm of skin: Secondary | ICD-10-CM | POA: Diagnosis not present

## 2019-11-24 DIAGNOSIS — D2261 Melanocytic nevi of right upper limb, including shoulder: Secondary | ICD-10-CM | POA: Diagnosis not present

## 2019-12-06 ENCOUNTER — Other Ambulatory Visit: Payer: Self-pay | Admitting: Cardiovascular Disease

## 2019-12-06 NOTE — Telephone Encounter (Signed)
Prescription refill request for Xarelto received.   Last office visit: 08/12/2019, [place] Weight: 84.3 kg Age: 69 y.o. Scr: 1.25, 02/09/2019 CrCl: 67 ml/min   Prescription refill sent.

## 2019-12-06 NOTE — Telephone Encounter (Signed)
Refill Request.  

## 2019-12-29 ENCOUNTER — Other Ambulatory Visit
Admission: RE | Admit: 2019-12-29 | Discharge: 2019-12-29 | Disposition: A | Discharge status: Home / Self Care | Payer: Medicare Other | Source: Ambulatory Visit | Attending: Internal Medicine | Admitting: Internal Medicine

## 2019-12-29 ENCOUNTER — Other Ambulatory Visit: Payer: Self-pay

## 2019-12-29 DIAGNOSIS — Z01812 Encounter for preprocedural laboratory examination: Secondary | ICD-10-CM | POA: Insufficient documentation

## 2019-12-29 DIAGNOSIS — Z20822 Contact with and (suspected) exposure to covid-19: Secondary | ICD-10-CM | POA: Diagnosis not present

## 2019-12-29 LAB — SARS CORONAVIRUS 2 (TAT 6-24 HRS): SARS Coronavirus 2: NEGATIVE

## 2019-12-30 ENCOUNTER — Telehealth: Payer: Self-pay | Admitting: Cardiovascular Disease

## 2019-12-30 NOTE — Telephone Encounter (Signed)
Patient is calling ,states he has a colonoscopy on 7/12 and needs to ask questions about stopping some of his medications. Please call to discuss

## 2019-12-30 NOTE — Telephone Encounter (Signed)
Spoke with the patient. Patient was cleared from a cardiac standpoint back in March 2021 for his upcoming 01/02/20 colonoscopy. Advised the patient of our PharmDs recommendation regarding his Xarelto.  Per office protocol, patient can hold Xarelto for 1-2 day prior to procedure.  Patient will Hold Xarelto on 7/10 and 01/01/20. Adv the patient that we will defer to the surgeon to give his recommendation when it is safe for him to resume Xarelto after the procedure.  Patient verbalized understanding and voiced appreciation for the call back.

## 2020-01-02 ENCOUNTER — Other Ambulatory Visit: Payer: Self-pay

## 2020-01-02 ENCOUNTER — Ambulatory Visit: Payer: Medicare Other | Admitting: Certified Registered"

## 2020-01-02 ENCOUNTER — Ambulatory Visit
Admission: RE | Admit: 2020-01-02 | Discharge: 2020-01-02 | Disposition: A | Discharge status: Home / Self Care | Payer: Medicare Other | Attending: Internal Medicine | Admitting: Internal Medicine

## 2020-01-02 ENCOUNTER — Encounter: Payer: Self-pay | Admitting: Internal Medicine

## 2020-01-02 ENCOUNTER — Encounter
Admission: RE | Disposition: A | Discharge status: Home / Self Care | Payer: Self-pay | Source: Home / Self Care | Attending: Internal Medicine

## 2020-01-02 DIAGNOSIS — K640 First degree hemorrhoids: Secondary | ICD-10-CM | POA: Insufficient documentation

## 2020-01-02 DIAGNOSIS — R195 Other fecal abnormalities: Secondary | ICD-10-CM | POA: Diagnosis not present

## 2020-01-02 DIAGNOSIS — Z1211 Encounter for screening for malignant neoplasm of colon: Secondary | ICD-10-CM | POA: Diagnosis not present

## 2020-01-02 DIAGNOSIS — K5730 Diverticulosis of large intestine without perforation or abscess without bleeding: Secondary | ICD-10-CM | POA: Insufficient documentation

## 2020-01-02 DIAGNOSIS — D123 Benign neoplasm of transverse colon: Secondary | ICD-10-CM | POA: Diagnosis not present

## 2020-01-02 DIAGNOSIS — M1990 Unspecified osteoarthritis, unspecified site: Secondary | ICD-10-CM | POA: Insufficient documentation

## 2020-01-02 DIAGNOSIS — Z86010 Personal history of colonic polyps: Secondary | ICD-10-CM | POA: Diagnosis not present

## 2020-01-02 DIAGNOSIS — E785 Hyperlipidemia, unspecified: Secondary | ICD-10-CM | POA: Insufficient documentation

## 2020-01-02 DIAGNOSIS — Z7901 Long term (current) use of anticoagulants: Secondary | ICD-10-CM | POA: Diagnosis not present

## 2020-01-02 DIAGNOSIS — Z79899 Other long term (current) drug therapy: Secondary | ICD-10-CM | POA: Insufficient documentation

## 2020-01-02 DIAGNOSIS — K5790 Diverticulosis of intestine, part unspecified, without perforation or abscess without bleeding: Secondary | ICD-10-CM | POA: Diagnosis not present

## 2020-01-02 DIAGNOSIS — K635 Polyp of colon: Secondary | ICD-10-CM | POA: Diagnosis not present

## 2020-01-02 DIAGNOSIS — Z8719 Personal history of other diseases of the digestive system: Secondary | ICD-10-CM | POA: Insufficient documentation

## 2020-01-02 DIAGNOSIS — K648 Other hemorrhoids: Secondary | ICD-10-CM | POA: Diagnosis not present

## 2020-01-02 DIAGNOSIS — I10 Essential (primary) hypertension: Secondary | ICD-10-CM | POA: Insufficient documentation

## 2020-01-02 HISTORY — DX: Atherosclerotic heart disease of native coronary artery without angina pectoris: I25.10

## 2020-01-02 HISTORY — PX: COLONOSCOPY WITH PROPOFOL: SHX5780

## 2020-01-02 SURGERY — COLONOSCOPY WITH PROPOFOL
Anesthesia: General

## 2020-01-02 MED ORDER — SODIUM CHLORIDE 0.9 % IV SOLN: INTRAVENOUS | Status: DC

## 2020-01-02 MED ORDER — LIDOCAINE HCL (CARDIAC) PF 100 MG/5ML IV SOSY
PREFILLED_SYRINGE | INTRAVENOUS | Status: DC | PRN
  Administered 2020-01-02: 100 mg via INTRAVENOUS

## 2020-01-02 MED ORDER — PROPOFOL 500 MG/50ML IV EMUL
INTRAVENOUS | Status: DC | PRN
  Administered 2020-01-02: 155 ug/kg/min via INTRAVENOUS

## 2020-01-02 MED ORDER — PROPOFOL 10 MG/ML IV BOLUS
INTRAVENOUS | Status: DC | PRN
  Administered 2020-01-02 (×2): 20 mg via INTRAVENOUS

## 2020-01-02 NOTE — Transfer of Care (Signed)
Immediate Anesthesia Transfer of Care Note  Patient: Dalton Sanders  Procedure(s) Performed: COLONOSCOPY WITH PROPOFOL (N/A )  Patient Location: Endoscopy Unit  Anesthesia Type:General  Level of Consciousness: drowsy, patient cooperative and responds to stimulation  Airway & Oxygen Therapy: Patient Spontanous Breathing and Patient connected to face mask oxygen  Post-op Assessment: Report given to RN and Post -op Vital signs reviewed and stable  Post vital signs: Reviewed and stable  Last Vitals:  Vitals Value Taken Time  BP    Temp    Pulse 62 01/02/20 1346  Resp    SpO2 100 % 01/02/20 1346  Vitals shown include unvalidated device data.  Last Pain:  Vitals:   01/02/20 1232  TempSrc: Temporal  PainSc: 0-No pain         Complications: No complications documented.

## 2020-01-02 NOTE — H&P (Signed)
Outpatient short stay form Pre-procedure 01/02/2020 10:55 AM Dalton Sanders K. Alice Reichert, M.D.  Primary Physician: Mable Paris, FNP  Reason for visit:  Personal history of adenomatous colon polyps x 2 - 11/28/14 colonoscopy.  History of present illness:                           Patient presents for colonoscopy for a personal hx of colon polyps. The patient denies abdominal pain, abnormal weight loss or rectal bleeding.     No current facility-administered medications for this encounter.  Current Outpatient Medications:  .  atorvastatin (LIPITOR) 80 MG tablet, TAKE 1 TABLET BY MOUTH EVERY DAY, Disp: 90 tablet, Rfl: 1 .  Cholecalciferol (VITAMIN D) 2000 UNITS tablet, Take 4,000 Units by mouth daily. , Disp: , Rfl:  .  Coenzyme Q10 (CO Q 10 PO), Take 1 tablet by mouth daily., Disp: , Rfl:  .  diltiazem (CARDIZEM CD) 240 MG 24 hr capsule, TAKE 1 CAPSULE BY MOUTH EVERY DAY, Disp: 90 capsule, Rfl: 3 .  diltiazem (CARDIZEM) 30 MG tablet, TAKE 1 TABLET (30 MG TOTAL) BY MOUTH AS NEEDED., Disp: 90 tablet, Rfl: 2 .  flecainide (TAMBOCOR) 50 MG tablet, TAKE 1 TABLET BY MOUTH TWICE A DAY, Disp: 180 tablet, Rfl: 0 .  MAGNESIUM PO, Take 1 capsule by mouth daily., Disp: , Rfl:  .  Multiple Vitamin (ONE-A-DAY MENS PO), Take 1 tablet by mouth daily. , Disp: , Rfl:  .  Saw Palmetto, Serenoa repens, (SAW PALMETTO PO), Take 40 mg by mouth daily. , Disp: , Rfl:  .  VITAMIN E PO, Take 1 capsule by mouth., Disp: , Rfl:  .  XARELTO 20 MG TABS tablet, TAKE 1 TABLET (20 MG TOTAL) BY MOUTH DAILY WITH SUPPER., Disp: 90 tablet, Rfl: 1  No medications prior to admission.     Allergies  Allergen Reactions  . Corticosteroids     Other reaction(s): precipitates Atrial Fib Respiratory distress  . Iodides   . Other     Hay Fever  . Oysters [Shellfish Allergy]   . Cortisone Palpitations    After cortizone shot for shoulder and sinus infection, went into Afib. This was prior to Cardizem.     Past Medical History:   Diagnosis Date  . Arthritis    knees  . Carotid arterial disease ([place])    a. 11/2016 U/S: mild bilat dzs - f/u in 2 yrs.  . Chest pain    a. 03/2009 MV: EF 70%, no isch/infarct (Dr. Nehemiah Massed);  b. 05/2013 ETT: Ex time 6:07, no st/t changes, HTN response to exericse->med rx; c. 03/2016 CT chest: coronary Ca2+ and atherosclerosis noted; d. 04/2016 Neg MV; e. 02/2017 Cath: LM nl, LAD min irregs, D1/2/3 nl, LCX min irregs, OM1/2/3 nl, RCA min irregs, RPDA/RPAV/RPL1/2 nl, EF 55-65%.  Marland Kitchen GERD (gastroesophageal reflux disease)   . Hyperlipidemia   . Hypertension   . PAF (paroxysmal atrial fibrillation) (HCC)    a. CHA2DS2VASc = 2-->Xarelto.  . Squamous cell carcinoma   . Syncope and collapse     Review of systems:  Otherwise negative.    Physical Exam  Gen: Alert, oriented. Appears stated age.  HEENT: [place]/AT. PERRLA. Lungs: CTA, no wheezes. CV: RR nl S1, S2. Abd: soft, benign, no masses. BS+ Ext: No edema. Pulses 2+    Planned procedures: Proceed with colonoscopy. The patient understands the nature of the planned procedure, indications, risks, alternatives and potential complications including but not limited to  bleeding, infection, perforation, damage to internal organs and possible oversedation/side effects from anesthesia. The patient agrees and gives consent to proceed.  Please refer to procedure notes for findings, recommendations and patient disposition/instructions.     Dalton Sanders K. Alice Reichert, M.D. Gastroenterology 01/02/2020  10:55 AM

## 2020-01-02 NOTE — Anesthesia Preprocedure Evaluation (Signed)
Anesthesia Evaluation  Patient identified by MRN, date of birth, ID band Patient awake    Reviewed: Allergy & Precautions, H&P , NPO status , Patient's Chart, lab work & pertinent test results, reviewed documented beta blocker date and time   History of Anesthesia Complications Negative for: history of anesthetic complications  Airway Mallampati: I  TM Distance: >3 FB Neck ROM: full    Dental  (+) Dental Advidsory Given, Caps, Chipped, Teeth Intact   Pulmonary neg shortness of breath, asthma , sleep apnea , neg COPD, neg recent URI,    Pulmonary exam normal breath sounds clear to auscultation       Cardiovascular Exercise Tolerance: Good hypertension, (-) angina+ CAD  (-) Past MI and (-) Cardiac Stents + dysrhythmias Atrial Fibrillation (-) Valvular Problems/Murmurs Rhythm:regular Rate:Normal     Neuro/Psych negative neurological ROS  negative psych ROS   GI/Hepatic Neg liver ROS, GERD  ,  Endo/Other  negative endocrine ROS  Renal/GU negative Renal ROS  negative genitourinary   Musculoskeletal   Abdominal   Peds  Hematology negative hematology ROS (+)   Anesthesia Other Findings Past Medical History: No date: Arthritis     Comment:  knees No date: Carotid arterial disease ([place])     Comment:  a. 11/2016 U/S: mild bilat dzs - f/u in 2 yrs. No date: Chest pain     Comment:  a. 03/2009 MV: EF 70%, no isch/infarct (Dr. Nehemiah Massed);                b. 05/2013 ETT: Ex time 6:07, no st/t changes, HTN               response to exericse->med rx; c. 03/2016 CT chest:               coronary Ca2+ and atherosclerosis noted; d. 04/2016 Neg               MV; e. 02/2017 Cath: LM nl, LAD min irregs, D1/2/3 nl, LCX              min irregs, OM1/2/3 nl, RCA min irregs, RPDA/RPAV/RPL1/2               nl, EF 55-65%. No date: Coronary artery disease No date: Dysrhythmia     Comment:  AFIB No date: GERD (gastroesophageal reflux  disease) No date: Hyperlipidemia No date: Hypertension No date: PAF (paroxysmal atrial fibrillation) (HCC)     Comment:  a. CHA2DS2VASc = 2-->Xarelto. No date: Squamous cell carcinoma No date: Syncope and collapse   Reproductive/Obstetrics negative OB ROS                             Anesthesia Physical Anesthesia Plan  ASA: II  Anesthesia Plan: General   Post-op Pain Management:    Induction: Intravenous  PONV Risk Score and Plan: 2 and Propofol infusion and TIVA  Airway Management Planned: Natural Airway and Nasal Cannula  Additional Equipment:   Intra-op Plan:   Post-operative Plan:   Informed Consent: I have reviewed the patients History and Physical, chart, labs and discussed the procedure including the risks, benefits and alternatives for the proposed anesthesia with the patient or authorized representative who has indicated his/her understanding and acceptance.     Dental Advisory Given  Plan Discussed with: Anesthesiologist, CRNA and Surgeon  Anesthesia Plan Comments:         Anesthesia Quick Evaluation

## 2020-01-02 NOTE — Interval H&P Note (Signed)
History and Physical Interval Note:  01/02/2020 1:13 PM  Dalton Sanders  has presented today for surgery, with the diagnosis of PERSONAL HX.OF COLON POLYPS.  The various methods of treatment have been discussed with the patient and family. After consideration of risks, benefits and other options for treatment, the patient has consented to  Procedure(s): COLONOSCOPY WITH PROPOFOL (N/A) as a surgical intervention.  The patient's history has been reviewed, patient examined, no change in status, stable for surgery.  I have reviewed the patient's chart and labs.  Questions were answered to the patient's satisfaction.     [place], [place]

## 2020-01-02 NOTE — Op Note (Addendum)
[facility] Gastroenterology Patient Name: Dalton Sanders Procedure Date: 01/02/2020 1:22 PM MRN: [id number] Account #: [id number] Date of Birth: 04/05/1951 Admit Type: Outpatient Age: 69 Room: [facility] ENDO ROOM 3 Gender: Male Note Status: Supervisor Override Procedure:             Colonoscopy Indications:           High risk colon cancer surveillance: Personal history                         of non-advanced adenoma, Personal history of                         adenomatous colon polyps x 2 (June 2016). Providers:             Benay Pike. Alice Reichert MD, MD Referring MD:          Yvetta Coder. Arnett (Referring MD) Medicines:             Propofol per Anesthesia Complications:         No immediate complications. Procedure:             Pre-Anesthesia Assessment:                        - The risks and benefits of the procedure and the                         sedation options and risks were discussed with the                         patient. All questions were answered and informed                         consent was obtained.                        - Patient identification and proposed procedure were                         verified prior to the procedure by the nurse. The                         procedure was verified in the procedure room.                        - ASA Grade Assessment: III - A patient with severe                         systemic disease.                        - After reviewing the risks and benefits, the patient                         was deemed in satisfactory condition to undergo the                         procedure.                        After obtaining informed  consent, the colonoscope was                         passed under direct vision. Throughout the procedure,                         the patient's blood pressure, pulse, and oxygen                         saturations were monitored continuously. The                         Colonoscope was  introduced through the anus and                         advanced to the the cecum, identified by appendiceal                         orifice and ileocecal valve. The colonoscopy was                         performed without difficulty. The patient tolerated                         the procedure well. The quality of the bowel                         preparation was adequate. The ileocecal valve,                         appendiceal orifice, and rectum were photographed. Findings:      The perianal and digital rectal examinations were normal. Pertinent       negatives include normal sphincter tone and no palpable rectal lesions.      Non-bleeding internal hemorrhoids were found during retroflexion. The       hemorrhoids were Grade I (internal hemorrhoids that do not prolapse).      Multiple small and large-mouthed diverticula were found in the sigmoid       colon. There was no evidence of diverticular bleeding.      A 4 mm polyp was found in the transverse colon. The polyp was sessile.       The polyp was removed with a jumbo cold forceps. Resection and retrieval       were complete. Estimated blood loss: none. Impression:            - Non-bleeding internal hemorrhoids.                        - Moderate diverticulosis in the sigmoid colon. There                         was no evidence of diverticular bleeding.                        - One 4 mm polyp in the transverse colon, removed with                         a jumbo cold forceps. Resected and retrieved. Recommendation:        -  Patient has a contact number available for                         emergencies. The signs and symptoms of potential                         delayed complications were discussed with the patient.                         Return to normal activities tomorrow. Written                         discharge instructions were provided to the patient.                        - Resume previous diet.                        -  Continue present medications.                        - Await pathology results.                        - Repeat colonoscopy in 5 years for surveillance.                        - Return to GI office PRN.                        - The findings and recommendations were discussed with                         the patient. Procedure Code(s):     --- Professional ---                        [phone], Colonoscopy, flexible; with biopsy, single or                         multiple Diagnosis Code(s):     --- Professional ---                        K57.30, Diverticulosis of large intestine without                         perforation or abscess without bleeding                        K63.5, Polyp of colon                        K64.0, First degree hemorrhoids                        Z86.010, Personal history of colonic polyps CPT copyright 2019 American Medical Association. All rights reserved. The codes documented in this report are preliminary and upon coder review may  be revised to meet current compliance requirements. Efrain Sella MD, MD 01/02/2020 1:46:51 PM This report has been signed electronically. Number of Addenda: 0 Note Initiated On: 01/02/2020 1:22 PM Scope Withdrawal Time:  0 hours 6 minutes 20 seconds  Total Procedure Duration: 0 hours 10 minutes 12 seconds  Estimated Blood Loss:  Estimated blood loss: none. Estimated blood loss: none.      [facility]

## 2020-01-02 NOTE — Anesthesia Procedure Notes (Signed)
Procedure Name: General with mask airway Performed by: Fletcher-Harrison, Debraann Livingstone, CRNA Pre-anesthesia Checklist: Patient identified, Emergency Drugs available, Suction available and Patient being monitored Patient Re-evaluated:Patient Re-evaluated prior to induction Oxygen Delivery Method: Simple face mask Induction Type: IV induction Placement Confirmation: positive ETCO2 and CO2 detector Dental Injury: Teeth and Oropharynx as per pre-operative assessment        

## 2020-01-03 ENCOUNTER — Encounter: Payer: Self-pay | Admitting: Internal Medicine

## 2020-01-03 NOTE — Anesthesia Postprocedure Evaluation (Signed)
Anesthesia Post Note  Patient: Dalton Sanders  Procedure(s) Performed: COLONOSCOPY WITH PROPOFOL (N/A )  Patient location during evaluation: Endoscopy Anesthesia Type: General Level of consciousness: awake and alert Pain management: pain level controlled Vital Signs Assessment: post-procedure vital signs reviewed and stable Respiratory status: spontaneous breathing, nonlabored ventilation, respiratory function stable and patient connected to nasal cannula oxygen Cardiovascular status: blood pressure returned to baseline and stable Postop Assessment: no apparent nausea or vomiting Anesthetic complications: no   No complications documented.   Last Vitals:  Vitals:   01/02/20 1356 01/02/20 1416  BP: (!) 111/58 (!) 153/89  Pulse: (!) 56 65  Resp: (!) 0 12  Temp:    SpO2: 100% 99%    Last Pain:  Vitals:   01/03/20 0750  TempSrc:   PainSc: 0-No pain                 Martha Clan

## 2020-01-04 LAB — SURGICAL PATHOLOGY

## 2020-01-10 DIAGNOSIS — D044 Carcinoma in situ of skin of scalp and neck: Secondary | ICD-10-CM | POA: Diagnosis not present

## 2020-01-30 LAB — HM COLONOSCOPY

## 2020-02-02 ENCOUNTER — Ambulatory Visit: Payer: Medicare Other

## 2020-02-07 ENCOUNTER — Ambulatory Visit (INDEPENDENT_AMBULATORY_CARE_PROVIDER_SITE_OTHER): Payer: Medicare Other

## 2020-02-07 VITALS — BP 129/78 | HR 66 | Temp 97.8°F | Ht 67.0 in | Wt 185.0 lb

## 2020-02-07 DIAGNOSIS — Z0000 Encounter for general adult medical examination without abnormal findings: Secondary | ICD-10-CM
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
## 2020-02-07 NOTE — Patient Instructions (Addendum)
Dalton Sanders , Thank you for taking time to come for your Medicare Wellness Visit. I appreciate your ongoing commitment to your health goals. Please review the following plan we discussed and let me know if I can assist you in the future.   These are the goals we discussed: Goals    . Follow up with Primary Care Provider     As needed       This is a list of the screening recommended for you and due dates:  Health Maintenance  Topic Date Due  . COVID-19 Vaccine (1) Never done  . Pneumonia vaccines (1 of 2 - PCV13) Never done  . Flu Shot  01/22/2020  . Colon Cancer Screening  01/01/2025  . Tetanus Vaccine  07/03/2025  .  Hepatitis C: One time screening is recommended by Center for Disease Control  (CDC) for  adults born from [birth year] through 1964.   Completed    Immunizations Immunization History  Administered Date(s) Administered  . Tdap 07/04/2015   Keep all routine maintenance appointments.   Follow up 02/24/20 @ 8:00  Advanced directives: declined  Conditions/risks identified: none new   Follow up in one year for your annual wellness visit.   Preventive Care 68 Years and Older, Male Preventive care refers to lifestyle choices and visits with your health care provider that can promote health and wellness. What does preventive care include?  A yearly physical exam. This is also called an annual well check.  Dental exams once or twice a year.  Routine eye exams. Ask your health care provider how often you should have your eyes checked.  Personal lifestyle choices, including:  Daily care of your teeth and gums.  Regular physical activity.  Eating a healthy diet.  Avoiding tobacco and drug use.  Limiting alcohol use.  Practicing safe sex.  Taking low doses of aspirin every day.  Taking vitamin and mineral supplements as recommended by your health care provider. What happens during an annual well check? The services and screenings done by your health care  provider during your annual well check will depend on your age, overall health, lifestyle risk factors, and family history of disease. Counseling  Your health care provider may ask you questions about your:  Alcohol use.  Tobacco use.  Drug use.  Emotional well-being.  Home and relationship well-being.  Sexual activity.  Eating habits.  History of falls.  Memory and ability to understand (cognition).  Work and work Statistician. Screening  You may have the following tests or measurements:  Height, weight, and BMI.  Blood pressure.  Lipid and cholesterol levels. These may be checked every 5 years, or more frequently if you are over 50 years old.  Skin check.  Lung cancer screening. You may have this screening every year starting at age 62 if you have a 30-pack-year history of smoking and currently smoke or have quit within the past 15 years.  Fecal occult blood test (FOBT) of the stool. You may have this test every year starting at age 7.  Flexible sigmoidoscopy or colonoscopy. You may have a sigmoidoscopy every 5 years or a colonoscopy every 10 years starting at age 32.  Prostate cancer screening. Recommendations will vary depending on your family history and other risks.  Hepatitis C blood test.  Hepatitis B blood test.  Sexually transmitted disease (STD) testing.  Diabetes screening. This is done by checking your blood sugar (glucose) after you have not eaten for a while (fasting). You may have  this done every 1-3 years.  Abdominal aortic aneurysm (AAA) screening. You may need this if you are a current or former smoker.  Osteoporosis. You may be screened starting at age 49 if you are at high risk. Talk with your health care provider about your test results, treatment options, and if necessary, the need for more tests. Vaccines  Your health care provider may recommend certain vaccines, such as:  Influenza vaccine. This is recommended every year.  Tetanus,  diphtheria, and acellular pertussis (Tdap, Td) vaccine. You may need a Td booster every 10 years.  Zoster vaccine. You may need this after age 19.  Pneumococcal 13-valent conjugate (PCV13) vaccine. One dose is recommended after age 51.  Pneumococcal polysaccharide (PPSV23) vaccine. One dose is recommended after age 12. Talk to your health care provider about which screenings and vaccines you need and how often you need them. This information is not intended to replace advice given to you by your health care provider. Make sure you discuss any questions you have with your health care provider. Document Released: 07/06/2015 Document Revised: 02/27/2016 Document Reviewed: 04/10/2015 Elsevier Interactive Patient Education  2017 [place] Prevention in the Home Falls can cause injuries. They can happen to people of all ages. There are many things you can do to make your home safe and to help prevent falls. What can I do on the outside of my home?  Regularly fix the edges of walkways and driveways and fix any cracks.  Remove anything that might make you trip as you walk through a door, such as a raised step or threshold.  Trim any bushes or trees on the path to your home.  Use bright outdoor lighting.  Clear any walking paths of anything that might make someone trip, such as rocks or tools.  Regularly check to see if handrails are loose or broken. Make sure that both sides of any steps have handrails.  Any raised decks and porches should have guardrails on the edges.  Have any leaves, snow, or ice cleared regularly.  Use sand or salt on walking paths during winter.  Clean up any spills in your garage right away. This includes oil or grease spills. What can I do in the bathroom?  Use night lights.  Install grab bars by the toilet and in the tub and shower. Do not use towel bars as grab bars.  Use non-skid mats or decals in the tub or shower.  If you need to sit down in  the shower, use a plastic, non-slip stool.  Keep the floor dry. Clean up any water that spills on the floor as soon as it happens.  Remove soap buildup in the tub or shower regularly.  Attach bath mats securely with double-sided non-slip rug tape.  Do not have throw rugs and other things on the floor that can make you trip. What can I do in the bedroom?  Use night lights.  Make sure that you have a light by your bed that is easy to reach.  Do not use any sheets or blankets that are too big for your bed. They should not hang down onto the floor.  Have a firm chair that has side arms. You can use this for support while you get dressed.  Do not have throw rugs and other things on the floor that can make you trip. What can I do in the kitchen?  Clean up any spills right away.  Avoid walking on wet floors.  Keep items that you use a lot in easy-to-reach places.  If you need to reach something above you, use a strong step stool that has a grab bar.  Keep electrical cords out of the way.  Do not use floor polish or wax that makes floors slippery. If you must use wax, use non-skid floor wax.  Do not have throw rugs and other things on the floor that can make you trip. What can I do with my stairs?  Do not leave any items on the stairs.  Make sure that there are handrails on both sides of the stairs and use them. Fix handrails that are broken or loose. Make sure that handrails are as long as the stairways.  Check any carpeting to make sure that it is firmly attached to the stairs. Fix any carpet that is loose or worn.  Avoid having throw rugs at the top or bottom of the stairs. If you do have throw rugs, attach them to the floor with carpet tape.  Make sure that you have a light switch at the top of the stairs and the bottom of the stairs. If you do not have them, ask someone to add them for you. What else can I do to help prevent falls?  Wear shoes that:  Do not have high  heels.  Have rubber bottoms.  Are comfortable and fit you well.  Are closed at the toe. Do not wear sandals.  If you use a stepladder:  Make sure that it is fully opened. Do not climb a closed stepladder.  Make sure that both sides of the stepladder are locked into place.  Ask someone to hold it for you, if possible.  Clearly mark and make sure that you can see:  Any grab bars or handrails.  First and last steps.  Where the edge of each step is.  Use tools that help you move around (mobility aids) if they are needed. These include:  Canes.  Walkers.  Scooters.  Crutches.  Turn on the lights when you go into a dark area. Replace any light bulbs as soon as they burn out.  Set up your furniture so you have a clear path. Avoid moving your furniture around.  If any of your floors are uneven, fix them.  If there are any pets around you, be aware of where they are.  Review your medicines with your doctor. Some medicines can make you feel dizzy. This can increase your chance of falling. Ask your doctor what other things that you can do to help prevent falls. This information is not intended to replace advice given to you by your health care provider. Make sure you discuss any questions you have with your health care provider. Document Released: 04/05/2009 Document Revised: 11/15/2015 Document Reviewed: 07/14/2014 Elsevier Interactive Patient Education  2017 [employer].

## 2020-02-08 ENCOUNTER — Other Ambulatory Visit: Payer: Self-pay

## 2020-02-08 DIAGNOSIS — E785 Hyperlipidemia, unspecified: Secondary | ICD-10-CM

## 2020-02-08 MED ORDER — ATORVASTATIN CALCIUM 80 MG PO TABS: 80.0000 mg | ORAL_TABLET | Freq: Every day | ORAL | 1 refills | Status: DC

## 2020-02-08 NOTE — Telephone Encounter (Signed)
Patients pharmacy requested a refill of atorvastatin. Patient has not been seen since 02/04/2019. Williams next appointment is for 02/24/2020. Ok to refill?

## 2020-02-11 ENCOUNTER — Other Ambulatory Visit: Payer: Self-pay | Admitting: Cardiovascular Disease

## 2020-02-12 ENCOUNTER — Other Ambulatory Visit: Payer: Self-pay | Admitting: Cardiovascular Disease

## 2020-02-13 ENCOUNTER — Other Ambulatory Visit: Payer: Self-pay

## 2020-02-13 MED ORDER — FLECAINIDE ACETATE 50 MG PO TABS: 50.0000 mg | ORAL_TABLET | Freq: Two times a day (BID) | ORAL | 0 refills | Status: DC

## 2020-02-13 NOTE — Telephone Encounter (Signed)
*  STAT* If patient is at the pharmacy, call can be transferred to refill team.   1. Which medications need to be refilled? (please list name of each medication and dose if known) Flecainide 2. Which pharmacy/location (including street and city if local pharmacy) is medication to be sent to? [facility] [place]  3. Do they need a 30 day or 90 day supply? [place]

## 2020-02-16 ENCOUNTER — Ambulatory Visit (INDEPENDENT_AMBULATORY_CARE_PROVIDER_SITE_OTHER): Payer: Medicare Other | Admitting: Cardiovascular Disease

## 2020-02-16 ENCOUNTER — Other Ambulatory Visit: Payer: Self-pay

## 2020-02-16 ENCOUNTER — Encounter: Payer: Self-pay | Admitting: Cardiovascular Disease

## 2020-02-16 VITALS — BP 144/80 | HR 65 | Ht 67.0 in | Wt 183.0 lb

## 2020-02-16 DIAGNOSIS — I10 Essential (primary) hypertension: Secondary | ICD-10-CM

## 2020-02-16 DIAGNOSIS — I480 Paroxysmal atrial fibrillation: Secondary | ICD-10-CM | POA: Diagnosis not present

## 2020-02-16 DIAGNOSIS — I6523 Occlusion and stenosis of bilateral carotid arteries: Secondary | ICD-10-CM | POA: Diagnosis not present

## 2020-02-16 DIAGNOSIS — E7800 Pure hypercholesterolemia, unspecified: Secondary | ICD-10-CM
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
## 2020-02-16 NOTE — Patient Instructions (Signed)

## 2020-02-23 ENCOUNTER — Other Ambulatory Visit: Payer: Self-pay | Admitting: Cardiovascular Disease

## 2020-02-24 ENCOUNTER — Ambulatory Visit (INDEPENDENT_AMBULATORY_CARE_PROVIDER_SITE_OTHER): Payer: Medicare Other | Admitting: Family

## 2020-02-24 ENCOUNTER — Encounter: Payer: Self-pay | Admitting: Family

## 2020-02-24 ENCOUNTER — Other Ambulatory Visit: Payer: Self-pay

## 2020-02-24 ENCOUNTER — Telehealth: Payer: Self-pay | Admitting: Family

## 2020-02-24 ENCOUNTER — Telehealth: Payer: Self-pay | Admitting: Cardiovascular Disease

## 2020-02-24 VITALS — BP 136/76 | HR 60 | Temp 97.7°F | Ht 66.0 in | Wt 182.0 lb

## 2020-02-24 DIAGNOSIS — I10 Essential (primary) hypertension: Secondary | ICD-10-CM

## 2020-02-24 DIAGNOSIS — I6523 Occlusion and stenosis of bilateral carotid arteries: Secondary | ICD-10-CM

## 2020-02-24 DIAGNOSIS — R351 Nocturia: Secondary | ICD-10-CM | POA: Diagnosis not present

## 2020-02-24 DIAGNOSIS — Z125 Encounter for screening for malignant neoplasm of prostate: Secondary | ICD-10-CM | POA: Diagnosis not present

## 2020-02-24 DIAGNOSIS — E7800 Pure hypercholesterolemia, unspecified: Secondary | ICD-10-CM | POA: Diagnosis not present

## 2020-02-24 LAB — LIPID PANEL
Cholesterol: 144 mg/dL (ref 0–200)
HDL: 49.3 mg/dL (ref >39.00)
LDL Cholesterol: 62 mg/dL (ref 0–99)
NonHDL: 94.9
Total CHOL/HDL Ratio: 3
Triglycerides: 163 mg/dL — ABNORMAL HIGH (ref 0.0–149.0)
VLDL: 32.6 mg/dL (ref 0.0–40.0)

## 2020-02-24 LAB — CBC WITH DIFFERENTIAL/PLATELET
Basophils Absolute: 0.1 10*3/uL (ref 0.0–0.1)
Basophils Relative: 1.1 % (ref 0.0–3.0)
Eosinophils Absolute: 0.8 10*3/uL — ABNORMAL HIGH (ref 0.0–0.7)
Eosinophils Relative: 9.5 % — ABNORMAL HIGH (ref 0.0–5.0)
HCT: 42.5 % (ref 39.0–52.0)
Hemoglobin: 14.2 g/dL (ref 13.0–17.0)
Lymphocytes Relative: 40.6 % (ref 12.0–46.0)
Lymphs Abs: 3.3 10*3/uL (ref 0.7–4.0)
MCHC: 33.4 g/dL (ref 30.0–36.0)
MCV: 95.2 fl (ref 78.0–100.0)
Monocytes Absolute: 0.8 10*3/uL (ref 0.1–1.0)
Monocytes Relative: 9.4 % (ref 3.0–12.0)
Neutro Abs: 3.2 10*3/uL (ref 1.4–7.7)
Neutrophils Relative %: 39.4 % — ABNORMAL LOW (ref 43.0–77.0)
Platelets: 237 10*3/uL (ref 150.0–400.0)
RBC: 4.46 Mil/uL (ref 4.22–5.81)
RDW: 14.2 % (ref 11.5–15.5)
WBC: 8 10*3/uL (ref 4.0–10.5)

## 2020-02-24 LAB — COMPREHENSIVE METABOLIC PANEL
ALT: 17 U/L (ref 0–53)
AST: 17 U/L (ref 0–37)
Albumin: 4.3 g/dL (ref 3.5–5.2)
Alkaline Phosphatase: 86 U/L (ref 39–117)
BUN: 14 mg/dL (ref 6–23)
CO2: 27 mEq/L (ref 19–32)
Calcium: 9.4 mg/dL (ref 8.4–10.5)
Chloride: 104 mEq/L (ref 96–112)
Creatinine, Ser: 1.2 mg/dL (ref 0.40–1.50)
GFR: 60.17 mL/min (ref >60.00)
Glucose, Bld: 102 mg/dL — ABNORMAL HIGH (ref 70–99)
Potassium: 4.5 mEq/L (ref 3.5–5.1)
Sodium: 139 mEq/L (ref 135–145)
Total Bilirubin: 0.7 mg/dL (ref 0.2–1.2)
Total Protein: 7 g/dL (ref 6.0–8.3)

## 2020-02-24 LAB — PSA: PSA: 2.13 ng/mL (ref 0.10–4.00)

## 2020-02-24 NOTE — Assessment & Plan Note (Signed)
Chronic, unchanged. Pending psa.

## 2020-02-24 NOTE — Addendum Note (Signed)
Addended by: Tressie Ellis E on: 02/24/2020 10:31 AM   Modules accepted: Orders

## 2020-02-24 NOTE — Patient Instructions (Addendum)
Stay safe.   It is imperative that you are seen AT least twice per year for labs and monitoring. Monitor blood pressure at home and me 5-6 reading on separate days. Goal is less than 120/80, based on newest guidelines, however we certainly want to be less than 130/80;  if persistently higher, please make sooner follow up appointment so we can recheck you blood pressure and manage/ adjust medications.    Health Maintenance, Male Adopting a healthy lifestyle and getting preventive care are important in promoting health and wellness. Ask your health care provider about:  The right schedule for you to have regular tests and exams.  Things you can do on your own to prevent diseases and keep yourself healthy. What should I know about diet, weight, and exercise? Eat a healthy diet   Eat a diet that includes plenty of vegetables, fruits, low-fat dairy products, and lean protein.  Do not eat a lot of foods that are high in solid fats, added sugars, or sodium. Maintain a healthy weight Body mass index (BMI) is a measurement that can be used to identify possible weight problems. It estimates body fat based on height and weight. Your health care provider can help determine your BMI and help you achieve or maintain a healthy weight. Get regular exercise Get regular exercise. This is one of the most important things you can do for your health. Most adults should:  Exercise for at least 150 minutes each week. The exercise should increase your heart rate and make you sweat (moderate-intensity exercise).  Do strengthening exercises at least twice a week. This is in addition to the moderate-intensity exercise.  Spend less time sitting. Even light physical activity can be beneficial. Watch cholesterol and blood lipids Have your blood tested for lipids and cholesterol at 68 years of age, then have this test every 5 years. You may need to have your cholesterol levels checked more often if:  Your lipid or  cholesterol levels are high.  You are older than 68 years of age.  You are at high risk for heart disease. What should I know about cancer screening? Many types of cancers can be detected early and may often be prevented. Depending on your health history and family history, you may need to have cancer screening at various ages. This may include screening for:  Colorectal cancer.  Prostate cancer.  Skin cancer.  Lung cancer. What should I know about heart disease, diabetes, and high blood pressure? Blood pressure and heart disease  High blood pressure causes heart disease and increases the risk of stroke. This is more likely to develop in people who have high blood pressure readings, are of African descent, or are overweight.  Talk with your health care provider about your target blood pressure readings.  Have your blood pressure checked: ? Every 3-5 years if you are 59-36 years of age. ? Every year if you are 35 years old or older.  If you are between the ages of 58 and 43 and are a current or former smoker, ask your health care provider if you should have a one-time screening for abdominal aortic aneurysm (AAA). Diabetes Have regular diabetes screenings. This checks your fasting blood sugar level. Have the screening done:  Once every three years after age 70 if you are at a normal weight and have a low risk for diabetes.  More often and at a younger age if you are overweight or have a high risk for diabetes. What should I  know about preventing infection? Hepatitis B If you have a higher risk for hepatitis B, you should be screened for this virus. Talk with your health care provider to find out if you are at risk for hepatitis B infection. Hepatitis C Blood testing is recommended for:  Everyone born from [birth year] through 1964.  Anyone with known risk factors for hepatitis C. Sexually transmitted infections (STIs)  You should be screened each year for STIs, including gonorrhea  and chlamydia, if: ? You are sexually active and are younger than 68 years of age. ? You are older than 68 years of age and your health care provider tells you that you are at risk for this type of infection. ? Your sexual activity has changed since you were last screened, and you are at increased risk for chlamydia or gonorrhea. Ask your health care provider if you are at risk.  Ask your health care provider about whether you are at high risk for HIV. Your health care provider may recommend a prescription medicine to help prevent HIV infection. If you choose to take medicine to prevent HIV, you should first get tested for HIV. You should then be tested every 3 months for as long as you are taking the medicine. Follow these instructions at home: Lifestyle  Do not use any products that contain nicotine or tobacco, such as cigarettes, e-cigarettes, and chewing tobacco. If you need help quitting, ask your health care provider.  Do not use street drugs.  Do not share needles.  Ask your health care provider for help if you need support or information about quitting drugs. Alcohol use  Do not drink alcohol if your health care provider tells you not to drink.  If you drink alcohol: ? Limit how much you have to 0-2 drinks a day. ? Be aware of how much alcohol is in your drink. In the [place] one drink equals one 12 oz bottle of beer (355 mL), one 5 oz glass of wine (148 mL), or one 1 oz glass of hard liquor (44 mL). General instructions  Schedule regular health, dental, and eye exams.  Stay current with your vaccines.  Tell your health care provider if: ? You often feel depressed. ? You have ever been abused or do not feel safe at home. Summary  Adopting a healthy lifestyle and getting preventive care are important in promoting health and wellness.  Follow your health care provider's instructions about healthy diet, exercising, and getting tested or screened for diseases.  Follow your  health care provider's instructions on monitoring your cholesterol and blood pressure. This information is not intended to replace advice given to you by your health care provider. Make sure you discuss any questions you have with your health care provider. Document Revised: 06/02/2018 Document Reviewed: 06/02/2018 Elsevier Patient Education  2020 [employer].

## 2020-02-24 NOTE — Assessment & Plan Note (Signed)
Stable, pending lipid panel.

## 2020-02-24 NOTE — Telephone Encounter (Signed)
FYI I have changed vitamin D dose in chart.

## 2020-02-24 NOTE — Telephone Encounter (Signed)
noted 
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
## 2020-02-24 NOTE — Telephone Encounter (Signed)
°*  STAT* If patient is at the pharmacy, call can be transferred to refill team.   1. Which medications need to be refilled? (please list name of each medication and dose if known) diltiazem 240 mg daily  2. Which pharmacy/location (including street and city if local pharmacy) is medication to be sent to? [facility] [place]  3. Do they need a 30 day or 90 day supply? [place]

## 2020-02-24 NOTE — Assessment & Plan Note (Signed)
Controlled, in particular at home. Continue regimen.

## 2020-02-24 NOTE — Telephone Encounter (Signed)
Patient saw Dalton Sanders today and was told to call in bp readings, reading is 114/71, pulse 56. Patient also wanted Arnett to know that he is taking 5000 mg of vitamin D daily.

## 2020-02-24 NOTE — Telephone Encounter (Signed)
90 day supply sent to pharmacy on 02/13/2020.

## 2020-02-28 ENCOUNTER — Other Ambulatory Visit: Payer: Self-pay | Admitting: Family

## 2020-02-28 DIAGNOSIS — R7989 Other specified abnormal findings of blood chemistry: Secondary | ICD-10-CM

## 2020-03-21 ENCOUNTER — Ambulatory Visit: Payer: Medicare Other | Admitting: Family

## 2020-04-30 ENCOUNTER — Other Ambulatory Visit (INDEPENDENT_AMBULATORY_CARE_PROVIDER_SITE_OTHER): Payer: Medicare Other

## 2020-04-30 ENCOUNTER — Other Ambulatory Visit: Payer: Self-pay | Admitting: Family

## 2020-04-30 ENCOUNTER — Other Ambulatory Visit: Payer: Self-pay

## 2020-04-30 DIAGNOSIS — R7989 Other specified abnormal findings of blood chemistry: Secondary | ICD-10-CM

## 2020-04-30 DIAGNOSIS — D721 Eosinophilia, unspecified: Secondary | ICD-10-CM

## 2020-04-30 LAB — CBC WITH DIFFERENTIAL/PLATELET
Basophils Absolute: 0.1 10*3/uL (ref 0.0–0.1)
Basophils Relative: 0.9 % (ref 0.0–3.0)
Eosinophils Absolute: 1 10*3/uL — ABNORMAL HIGH (ref 0.0–0.7)
Eosinophils Relative: 10 % — ABNORMAL HIGH (ref 0.0–5.0)
HCT: 40.8 % (ref 39.0–52.0)
Hemoglobin: 13.9 g/dL (ref 13.0–17.0)
Lymphocytes Relative: 40.2 % (ref 12.0–46.0)
Lymphs Abs: 3.9 10*3/uL (ref 0.7–4.0)
MCHC: 34.1 g/dL (ref 30.0–36.0)
MCV: 93 fl (ref 78.0–100.0)
Monocytes Absolute: 0.9 10*3/uL (ref 0.1–1.0)
Monocytes Relative: 8.9 % (ref 3.0–12.0)
Neutro Abs: 3.8 10*3/uL (ref 1.4–7.7)
Neutrophils Relative %: 40 % — ABNORMAL LOW (ref 43.0–77.0)
Platelets: 250 10*3/uL (ref 150.0–400.0)
RBC: 4.38 Mil/uL (ref 4.22–5.81)
RDW: 14.4 % (ref 11.5–15.5)
WBC: 9.6 10*3/uL (ref 4.0–10.5)
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
## 2020-05-08 ENCOUNTER — Ambulatory Visit
Admission: RE | Admit: 2020-05-08 | Discharge: 2020-05-08 | Disposition: A | Discharge status: Home / Self Care | Payer: Medicare Other | Attending: Hematology and Oncology | Admitting: Hematology and Oncology

## 2020-05-08 ENCOUNTER — Encounter: Payer: Self-pay | Admitting: Hematology and Oncology

## 2020-05-08 ENCOUNTER — Inpatient Hospital Stay: Payer: Medicare Other | Attending: Hematology and Oncology

## 2020-05-08 ENCOUNTER — Inpatient Hospital Stay: Payer: Medicare Other | Attending: Hematology and Oncology | Admitting: Hematology and Oncology

## 2020-05-08 ENCOUNTER — Ambulatory Visit
Admission: RE | Admit: 2020-05-08 | Discharge: 2020-05-08 | Disposition: A | Discharge status: Home / Self Care | Payer: Medicare Other | Source: Ambulatory Visit | Attending: Hematology and Oncology | Admitting: Hematology and Oncology

## 2020-05-08 ENCOUNTER — Other Ambulatory Visit: Payer: Self-pay

## 2020-05-08 VITALS — BP 147/79 | HR 64 | Temp 97.3°F | Resp 20 | Ht 66.0 in | Wt 179.6 lb

## 2020-05-08 DIAGNOSIS — Z833 Family history of diabetes mellitus: Secondary | ICD-10-CM | POA: Diagnosis not present

## 2020-05-08 DIAGNOSIS — D721 Eosinophilia, unspecified: Secondary | ICD-10-CM

## 2020-05-08 DIAGNOSIS — I2510 Atherosclerotic heart disease of native coronary artery without angina pectoris: Secondary | ICD-10-CM

## 2020-05-08 DIAGNOSIS — E785 Hyperlipidemia, unspecified: Secondary | ICD-10-CM | POA: Diagnosis not present

## 2020-05-08 DIAGNOSIS — Z79899 Other long term (current) drug therapy: Secondary | ICD-10-CM | POA: Diagnosis not present

## 2020-05-08 DIAGNOSIS — R0602 Shortness of breath: Secondary | ICD-10-CM

## 2020-05-08 DIAGNOSIS — Z800 Family history of malignant neoplasm of digestive organs: Secondary | ICD-10-CM

## 2020-05-08 DIAGNOSIS — R0600 Dyspnea, unspecified: Secondary | ICD-10-CM | POA: Diagnosis not present

## 2020-05-08 DIAGNOSIS — J45909 Unspecified asthma, uncomplicated: Secondary | ICD-10-CM

## 2020-05-08 DIAGNOSIS — K5730 Diverticulosis of large intestine without perforation or abscess without bleeding: Secondary | ICD-10-CM

## 2020-05-08 DIAGNOSIS — Z8261 Family history of arthritis: Secondary | ICD-10-CM

## 2020-05-08 DIAGNOSIS — Z85828 Personal history of other malignant neoplasm of skin: Secondary | ICD-10-CM | POA: Diagnosis not present

## 2020-05-08 DIAGNOSIS — I6523 Occlusion and stenosis of bilateral carotid arteries: Secondary | ICD-10-CM

## 2020-05-08 DIAGNOSIS — Z803 Family history of malignant neoplasm of breast: Secondary | ICD-10-CM | POA: Diagnosis not present

## 2020-05-08 DIAGNOSIS — L649 Androgenic alopecia, unspecified: Secondary | ICD-10-CM | POA: Diagnosis not present

## 2020-05-08 DIAGNOSIS — Z7901 Long term (current) use of anticoagulants: Secondary | ICD-10-CM

## 2020-05-08 DIAGNOSIS — R911 Solitary pulmonary nodule: Secondary | ICD-10-CM | POA: Diagnosis not present

## 2020-05-08 DIAGNOSIS — K648 Other hemorrhoids: Secondary | ICD-10-CM

## 2020-05-08 DIAGNOSIS — R350 Frequency of micturition: Secondary | ICD-10-CM

## 2020-05-08 DIAGNOSIS — Z801 Family history of malignant neoplasm of trachea, bronchus and lung: Secondary | ICD-10-CM

## 2020-05-08 DIAGNOSIS — I10 Essential (primary) hypertension: Secondary | ICD-10-CM | POA: Diagnosis not present

## 2020-05-08 DIAGNOSIS — Z841 Family history of disorders of kidney and ureter: Secondary | ICD-10-CM

## 2020-05-08 DIAGNOSIS — Z8042 Family history of malignant neoplasm of prostate: Secondary | ICD-10-CM

## 2020-05-08 DIAGNOSIS — Z8249 Family history of ischemic heart disease and other diseases of the circulatory system: Secondary | ICD-10-CM | POA: Diagnosis not present

## 2020-05-08 DIAGNOSIS — I480 Paroxysmal atrial fibrillation: Secondary | ICD-10-CM

## 2020-05-08 DIAGNOSIS — M2550 Pain in unspecified joint: Secondary | ICD-10-CM

## 2020-05-08 DIAGNOSIS — Z823 Family history of stroke: Secondary | ICD-10-CM

## 2020-05-08 DIAGNOSIS — R5383 Other fatigue: Secondary | ICD-10-CM | POA: Diagnosis not present

## 2020-05-08 LAB — COMPREHENSIVE METABOLIC PANEL
ALT: 20 U/L (ref 0–44)
AST: 24 U/L (ref 15–41)
Albumin: 4.2 g/dL (ref 3.5–5.0)
Alkaline Phosphatase: 76 U/L (ref 38–126)
Anion gap: 11 (ref 5–15)
BUN: 16 mg/dL (ref 8–23)
CO2: 27 mmol/L (ref 22–32)
Calcium: 9 mg/dL (ref 8.9–10.3)
Chloride: 102 mmol/L (ref 98–111)
Creatinine, Ser: 1.25 mg/dL — ABNORMAL HIGH (ref 0.61–1.24)
GFR, Estimated: >60 mL/min (ref >60)
Glucose, Bld: 117 mg/dL — ABNORMAL HIGH (ref 70–99)
Potassium: 3.6 mmol/L (ref 3.5–5.1)
Sodium: 140 mmol/L (ref 135–145)
Total Bilirubin: 0.6 mg/dL (ref 0.3–1.2)
Total Protein: 7.8 g/dL (ref 6.5–8.1)

## 2020-05-08 LAB — CBC WITH DIFFERENTIAL/PLATELET
Abs Immature Granulocytes: 0.02 10*3/uL (ref 0.00–0.07)
Basophils Absolute: 0.1 10*3/uL (ref 0.0–0.1)
Basophils Relative: 1 %
Eosinophils Absolute: 0.7 10*3/uL — ABNORMAL HIGH (ref 0.0–0.5)
Eosinophils Relative: 9 %
HCT: 41.9 % (ref 39.0–52.0)
Hemoglobin: 14 g/dL (ref 13.0–17.0)
Immature Granulocytes: 0 %
Lymphocytes Relative: 37 %
Lymphs Abs: 3 10*3/uL (ref 0.7–4.0)
MCH: 31.3 pg (ref 26.0–34.0)
MCHC: 33.4 g/dL (ref 30.0–36.0)
MCV: 93.5 fL (ref 80.0–100.0)
Monocytes Absolute: 0.8 10*3/uL (ref 0.1–1.0)
Monocytes Relative: 10 %
Neutro Abs: 3.4 10*3/uL (ref 1.7–7.7)
Neutrophils Relative %: 43 %
Platelets: 289 10*3/uL (ref 150–400)
RBC: 4.48 MIL/uL (ref 4.22–5.81)
RDW: 13.9 % (ref 11.5–15.5)
WBC: 8 10*3/uL (ref 4.0–10.5)
nRBC: 0 % (ref 0.0–0.2)

## 2020-05-08 NOTE — Progress Notes (Signed)
New patient here today referred by Dr. Vidal Schwalbe, due to eosinophils. Patient reports he thinks his eosinophils maybe affected by his cardizem. Denies any pain or other concerns at this time.

## 2020-05-09 LAB — PATHOLOGIST SMEAR REVIEW

## 2020-05-10 LAB — IMMUNOGLOBULINS A/E/G/M, SERUM
IgA: 488 mg/dL — ABNORMAL HIGH (ref 61–437)
IgE (Immunoglobulin E), Serum: 294 IU/mL (ref 6–495)
IgG (Immunoglobin G), Serum: 1053 mg/dL (ref 603–1613)
IgM (Immunoglobulin M), Srm: 56 mg/dL (ref 20–172)

## 2020-05-10 LAB — TRYPTASE: Tryptase: 5 ug/L (ref 2.2–13.2)
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
## 2020-05-15 ENCOUNTER — Inpatient Hospital Stay (HOSPITAL_BASED_OUTPATIENT_CLINIC_OR_DEPARTMENT_OTHER): Payer: Medicare Other | Admitting: Hematology and Oncology

## 2020-05-15 ENCOUNTER — Other Ambulatory Visit: Payer: Self-pay | Admitting: Cardiovascular Disease

## 2020-05-15 ENCOUNTER — Other Ambulatory Visit: Payer: Self-pay

## 2020-05-15 ENCOUNTER — Encounter: Payer: Self-pay | Admitting: Hematology and Oncology

## 2020-05-15 VITALS — BP 149/74 | HR 67 | Temp 97.2°F | Resp 18 | Wt 178.6 lb

## 2020-05-15 DIAGNOSIS — R918 Other nonspecific abnormal finding of lung field: Secondary | ICD-10-CM | POA: Diagnosis not present

## 2020-05-15 DIAGNOSIS — R0602 Shortness of breath: Secondary | ICD-10-CM | POA: Diagnosis not present

## 2020-05-15 DIAGNOSIS — R911 Solitary pulmonary nodule: Secondary | ICD-10-CM

## 2020-05-15 DIAGNOSIS — K5730 Diverticulosis of large intestine without perforation or abscess without bleeding: Secondary | ICD-10-CM | POA: Diagnosis not present

## 2020-05-15 DIAGNOSIS — D721 Eosinophilia, unspecified: Secondary | ICD-10-CM

## 2020-05-15 DIAGNOSIS — I6523 Occlusion and stenosis of bilateral carotid arteries: Secondary | ICD-10-CM | POA: Diagnosis not present

## 2020-05-15 DIAGNOSIS — K648 Other hemorrhoids: Secondary | ICD-10-CM | POA: Diagnosis not present

## 2020-05-15 DIAGNOSIS — R350 Frequency of micturition: Secondary | ICD-10-CM | POA: Diagnosis not present

## 2020-05-15 DIAGNOSIS — I480 Paroxysmal atrial fibrillation: Secondary | ICD-10-CM | POA: Diagnosis not present

## 2020-05-15 MED ORDER — DILTIAZEM HCL ER COATED BEADS 240 MG PO CP24
240.0000 mg | ORAL_CAPSULE | Freq: Every day | ORAL | 3 refills | Status: DC
Start: 2020-05-15 — End: 2020-12-07

## 2020-05-15 MED ORDER — FLECAINIDE ACETATE 50 MG PO TABS
50.0000 mg | ORAL_TABLET | Freq: Two times a day (BID) | ORAL | 3 refills | Status: DC
Start: 2020-05-15 — End: 2020-08-08

## 2020-05-15 NOTE — Telephone Encounter (Signed)
°*  STAT* If patient is at the pharmacy, call can be transferred to refill team.   1. Which medications need to be refilled? (please list name of each medication and dose if known)   Flecainide 50 mg po q d  Diltiazem 240 mg po q d  2. Which pharmacy/location (including street and city if local pharmacy) is medication to be sent to?  [facility]   3. Do they need a 30 day or 90 day supply? [place]

## 2020-05-15 NOTE — Telephone Encounter (Signed)
Requested Prescriptions   Signed Prescriptions Disp Refills   diltiazem (CARDIZEM CD) 240 MG 24 hr capsule 30 capsule 3    Sig: Take 1 capsule (240 mg total) by mouth daily.    Authorizing Provider: Kathlyn Sacramento A    Ordering User: Othelia Pulling C   flecainide (TAMBOCOR) 50 MG tablet 60 tablet 3    Sig: Take 1 tablet (50 mg total) by mouth 2 (two) times daily.    Authorizing Provider: Kathlyn Sacramento A    Ordering User: Britt Bottom

## 2020-05-15 NOTE — Telephone Encounter (Signed)
Pt last saw Dr Fletcher Anon 02/16/20, last labs 05/08/20 Creat 1.25, age 69, weight 81kg, CrCl 64.8, based on CrCl pt is on appropriate dosage of Xarelto 20mg  QD.  Will refill rx.

## 2020-05-21 DIAGNOSIS — D2261 Melanocytic nevi of right upper limb, including shoulder: Secondary | ICD-10-CM | POA: Diagnosis not present

## 2020-05-21 DIAGNOSIS — D225 Melanocytic nevi of trunk: Secondary | ICD-10-CM | POA: Diagnosis not present

## 2020-05-21 DIAGNOSIS — Z85828 Personal history of other malignant neoplasm of skin: Secondary | ICD-10-CM | POA: Diagnosis not present

## 2020-05-21 DIAGNOSIS — D2262 Melanocytic nevi of left upper limb, including shoulder: Secondary | ICD-10-CM | POA: Diagnosis not present

## 2020-05-21 DIAGNOSIS — D2271 Melanocytic nevi of right lower limb, including hip: Secondary | ICD-10-CM | POA: Diagnosis not present

## 2020-05-21 DIAGNOSIS — L821 Other seborrheic keratosis: Secondary | ICD-10-CM | POA: Diagnosis not present

## 2020-05-21 DIAGNOSIS — L570 Actinic keratosis: Secondary | ICD-10-CM | POA: Diagnosis not present

## 2020-05-21 DIAGNOSIS — X32XXXA Exposure to sunlight, initial encounter: Secondary | ICD-10-CM | POA: Diagnosis not present

## 2020-05-22 ENCOUNTER — Ambulatory Visit
Admission: RE | Admit: 2020-05-22 | Discharge: 2020-05-22 | Disposition: A | Discharge status: Home / Self Care | Payer: Medicare Other | Source: Ambulatory Visit | Attending: Hematology and Oncology | Admitting: Hematology and Oncology

## 2020-05-22 ENCOUNTER — Other Ambulatory Visit: Payer: Self-pay

## 2020-05-22 DIAGNOSIS — R918 Other nonspecific abnormal finding of lung field: Secondary | ICD-10-CM

## 2020-05-22 DIAGNOSIS — R911 Solitary pulmonary nodule: Secondary | ICD-10-CM | POA: Diagnosis not present

## 2020-05-22 DIAGNOSIS — I700 Atherosclerosis of aorta: Secondary | ICD-10-CM | POA: Diagnosis not present

## 2020-05-22 DIAGNOSIS — J439 Emphysema, unspecified: Secondary | ICD-10-CM | POA: Diagnosis not present

## 2020-05-22 DIAGNOSIS — I2510 Atherosclerotic heart disease of native coronary artery without angina pectoris: Secondary | ICD-10-CM | POA: Diagnosis not present

## 2020-05-22 DIAGNOSIS — K449 Diaphragmatic hernia without obstruction or gangrene: Secondary | ICD-10-CM | POA: Diagnosis not present
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
## 2020-05-23 ENCOUNTER — Encounter: Payer: Self-pay | Admitting: Hematology and Oncology

## 2020-05-23 ENCOUNTER — Inpatient Hospital Stay: Payer: Medicare Other | Attending: Hematology and Oncology | Admitting: Hematology and Oncology

## 2020-05-23 VITALS — BP 136/76 | HR 67 | Temp 97.8°F | Resp 16 | Ht 66.0 in | Wt 182.1 lb

## 2020-05-23 DIAGNOSIS — Z833 Family history of diabetes mellitus: Secondary | ICD-10-CM | POA: Insufficient documentation

## 2020-05-23 DIAGNOSIS — M170 Bilateral primary osteoarthritis of knee: Secondary | ICD-10-CM | POA: Diagnosis not present

## 2020-05-23 DIAGNOSIS — Z79899 Other long term (current) drug therapy: Secondary | ICD-10-CM | POA: Insufficient documentation

## 2020-05-23 DIAGNOSIS — J45909 Unspecified asthma, uncomplicated: Secondary | ICD-10-CM | POA: Diagnosis not present

## 2020-05-23 DIAGNOSIS — Z8249 Family history of ischemic heart disease and other diseases of the circulatory system: Secondary | ICD-10-CM | POA: Diagnosis not present

## 2020-05-23 DIAGNOSIS — M160 Bilateral primary osteoarthritis of hip: Secondary | ICD-10-CM | POA: Insufficient documentation

## 2020-05-23 DIAGNOSIS — E785 Hyperlipidemia, unspecified: Secondary | ICD-10-CM | POA: Insufficient documentation

## 2020-05-23 DIAGNOSIS — R5383 Other fatigue: Secondary | ICD-10-CM | POA: Diagnosis not present

## 2020-05-23 DIAGNOSIS — Z8261 Family history of arthritis: Secondary | ICD-10-CM | POA: Diagnosis not present

## 2020-05-23 DIAGNOSIS — Z841 Family history of disorders of kidney and ureter: Secondary | ICD-10-CM | POA: Diagnosis not present

## 2020-05-23 DIAGNOSIS — Z7901 Long term (current) use of anticoagulants: Secondary | ICD-10-CM | POA: Insufficient documentation

## 2020-05-23 DIAGNOSIS — Z823 Family history of stroke: Secondary | ICD-10-CM | POA: Insufficient documentation

## 2020-05-23 DIAGNOSIS — R918 Other nonspecific abnormal finding of lung field: Secondary | ICD-10-CM | POA: Insufficient documentation

## 2020-05-23 DIAGNOSIS — I6523 Occlusion and stenosis of bilateral carotid arteries: Secondary | ICD-10-CM | POA: Diagnosis not present

## 2020-05-23 DIAGNOSIS — D721 Eosinophilia, unspecified: Secondary | ICD-10-CM | POA: Insufficient documentation

## 2020-05-23 DIAGNOSIS — R911 Solitary pulmonary nodule: Secondary | ICD-10-CM | POA: Diagnosis not present

## 2020-05-23 DIAGNOSIS — I700 Atherosclerosis of aorta: Secondary | ICD-10-CM | POA: Insufficient documentation

## 2020-05-23 DIAGNOSIS — I10 Essential (primary) hypertension: Secondary | ICD-10-CM | POA: Diagnosis not present

## 2020-05-23 DIAGNOSIS — I2510 Atherosclerotic heart disease of native coronary artery without angina pectoris: Secondary | ICD-10-CM | POA: Diagnosis not present

## 2020-05-23 DIAGNOSIS — Z8042 Family history of malignant neoplasm of prostate: Secondary | ICD-10-CM | POA: Insufficient documentation

## 2020-05-23 NOTE — Progress Notes (Signed)
Pt had ct scan and he is here to get results.

## 2020-05-27 DIAGNOSIS — R0602 Shortness of breath: Secondary | ICD-10-CM | POA: Insufficient documentation

## 2020-06-25 ENCOUNTER — Telehealth: Payer: Self-pay

## 2020-06-25 NOTE — Telephone Encounter (Signed)
Pt said he put up fence board and his back is hurting. 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 He needs the referral sent to [facility] to see Dr. Myer Haff. He said he thinks he has aggravated it and he needs to get in as soon as possible.

## 2020-06-26 ENCOUNTER — Other Ambulatory Visit: Payer: Self-pay | Admitting: Family

## 2020-06-26 DIAGNOSIS — M25552 Pain in left hip: Secondary | ICD-10-CM

## 2020-06-26 NOTE — Telephone Encounter (Signed)
You are so awesome TY!

## 2020-06-26 NOTE — Telephone Encounter (Signed)
I called & advised patient on below. He stated that he had seen Dr. Darleen Crocker in the past at [facility] & he needed a Neurosurgeon. He will call over to Dr. Osborne Oman office to see if he can get scheduled.

## 2020-06-26 NOTE — Telephone Encounter (Signed)
I called [facility] neurosurgery and I was able to get pt scheduled for 06/27/2020 at 3pm. Dr Marcell Barlow is out of the office, pt is scheduled with his PA. I notified pt and he is aware.

## 2020-06-26 NOTE — Telephone Encounter (Signed)
Patient called about the status of his referral. Patient can hard stand and very painful to walk.

## 2020-06-26 NOTE — Telephone Encounter (Signed)
Dalton Sanders call pt I have placed referral urgently however Im not sure when he will be able to be seen.  Yes, I would advise that is seen today at [facility] for acute pain while we await an appt with [name], urgent referral to [facility]. Let me know how soon he can be seen

## 2020-06-28 ENCOUNTER — Telehealth: Payer: Self-pay | Admitting: Family

## 2020-06-28 DIAGNOSIS — M545 Low back pain, unspecified: Secondary | ICD-10-CM | POA: Diagnosis not present

## 2020-06-28 DIAGNOSIS — M47816 Spondylosis without myelopathy or radiculopathy, lumbar region: Secondary | ICD-10-CM | POA: Diagnosis not present

## 2020-06-28 DIAGNOSIS — Z03818 Encounter for observation for suspected exposure to other biological agents ruled out: Secondary | ICD-10-CM | POA: Diagnosis not present

## 2020-06-28 DIAGNOSIS — M5126 Other intervertebral disc displacement, lumbar region: Secondary | ICD-10-CM | POA: Diagnosis not present

## 2020-06-28 DIAGNOSIS — U071 COVID-19: Secondary | ICD-10-CM | POA: Diagnosis not present

## 2020-06-28 DIAGNOSIS — R059 Cough, unspecified: Secondary | ICD-10-CM | POA: Diagnosis not present

## 2020-06-28 DIAGNOSIS — J40 Bronchitis, not specified as acute or chronic: Secondary | ICD-10-CM | POA: Diagnosis not present

## 2020-06-28 NOTE — Telephone Encounter (Signed)
Please triage  Pt called he tested positive for covid and is having lower back pain,headache and a cough  He is also wanting to know about the infusion

## 2020-06-28 NOTE — Telephone Encounter (Signed)
Spoken to patient, she stated he has been having sx of covid, LBP, Headache, Sinus Congestion, clammy skin. No fever, chills, SOB, and heart palpitations. He also had a Positive at home test for covid. I instructed patient to go to an UC to be evaluated for covid due to no appointments available. He would like to have the infusion. I instructed patient to go to [facility] to  be evaluated and to start that process given his past medical hx.

## 2020-06-28 NOTE — Telephone Encounter (Signed)
Would you please go ahead an call [facility] infusion number and leave pt info? He is unvaccinated and high risk for severe disease Please advise pt that we will leave message for [facility] and to expect a phone call however he still MUST proceed for inperson eval at [facility] urgent as you advised as they may recommend further eval with chest xray etc

## 2020-06-29 NOTE — Telephone Encounter (Signed)
I have called to refer patient to infusion center.

## 2020-06-29 NOTE — Telephone Encounter (Signed)
Sarah please call our [facility] infusion center to  Leave message Triage for symptoms Does he have fever, sob?

## 2020-06-29 NOTE — Telephone Encounter (Signed)
Spoken to patient, he stated he was evaluated but they didn't have any monoclonial infusion left. [facility] was [facility] he [facility] both lines for the infusion. [facility] has not returned patients call back. He called [facility] facility and was instructed that PCP would have to have placed an order for him to have it done. I am currently unavailable to call infusion site number to leave a message. Patient stated that if he receives a call before we call him back from another facility he will let us know. Please advise.

## 2020-06-29 NOTE — Telephone Encounter (Signed)
I called to triage patient & he stated that he did not have any fever or SOB. He said that yesterday he was at the hospital & being ovid threw him into afib. They gave him number to call to try to schedule [facility] with [facility] or [facility]. When they called from [facility] they told patient that he needed to be referred by his physician. Patient also advised if he had worsening sx, went back into afib fever, SOB that he go to ED.

## 2020-06-29 NOTE — Telephone Encounter (Signed)
Pt called to follow up on infusion

## 2020-07-01 ENCOUNTER — Encounter: Payer: Self-pay | Admitting: Physician Assistant

## 2020-07-01 ENCOUNTER — Other Ambulatory Visit: Payer: Self-pay | Admitting: Physician Assistant

## 2020-07-01 DIAGNOSIS — I480 Paroxysmal atrial fibrillation: Secondary | ICD-10-CM

## 2020-07-01 DIAGNOSIS — I10 Essential (primary) hypertension: Secondary | ICD-10-CM

## 2020-07-01 DIAGNOSIS — Z6828 Body mass index (BMI) 28.0-28.9, adult: Secondary | ICD-10-CM | POA: Insufficient documentation

## 2020-07-01 DIAGNOSIS — D721 Eosinophilia, unspecified: Secondary | ICD-10-CM

## 2020-07-01 DIAGNOSIS — I6523 Occlusion and stenosis of bilateral carotid arteries: Secondary | ICD-10-CM

## 2020-07-01 DIAGNOSIS — U071 COVID-19: Secondary | ICD-10-CM

## 2020-07-01 NOTE — Progress Notes (Signed)
I connected by phone with [name] on 07/01/2020 at 10:37 AM to discuss the potential use of a new treatment for mild to moderate COVID-19 viral infection in non-hospitalized patients.  This patient is a 70 y.o. male that meets the FDA criteria for Emergency Use Authorization of COVID monoclonal antibody sotrovimab.  Has a (+) direct SARS-CoV-2 viral test result  Has mild or moderate COVID-19   Is NOT hospitalized due to COVID-19  Is within 10 days of symptom onset  Has at least one of the high risk factor(s) for progression to severe COVID-19 and/or hospitalization as defined in EUA.  Specific high risk criteria : Older age (>/= 69 yo), BMI > 25, Cardiovascular disease or hypertension, Chronic Lung Disease and Other high risk medical condition per CDC:  high SVI, unvaccinated.   I have spoken and communicated the following to the patient or parent/caregiver regarding COVID monoclonal antibody treatment:  1. FDA has authorized the emergency use for the treatment of mild to moderate COVID-19 in adults and pediatric patients with positive results of direct SARS-CoV-2 viral testing who are 72 years of age and older weighing at least 40 kg, and who are at high risk for progressing to severe COVID-19 and/or hospitalization.  2. The significant known and potential risks and benefits of COVID monoclonal antibody, and the extent to which such potential risks and benefits are unknown.  3. Information on available alternative treatments and the risks and benefits of those alternatives, including clinical trials.  4. Patients treated with COVID monoclonal antibody should continue to self-isolate and use infection control measures (e.g., wear mask, isolate, social distance, avoid sharing personal items, clean and disinfect "high touch" surfaces, and frequent handwashing) according to CDC guidelines.   5. The patient or parent/caregiver has the option to accept or refuse COVID monoclonal  antibody treatment.  After reviewing this information with the patient, the patient has agreed to receive one of the available covid 19 monoclonal antibodies and will be provided an appropriate fact sheet prior to infusion.   Sx onset 1/4. He is unvaccinated. Set up for Sotrovimab infusion on 1/10 @ 1:30pm.   Angelena Form, PA-C 07/01/2020 10:37 AM

## 2020-07-02 ENCOUNTER — Ambulatory Visit (HOSPITAL_COMMUNITY)
Admission: RE | Admit: 2020-07-02 | Discharge: 2020-07-02 | Disposition: A | Discharge status: Home / Self Care | Payer: Medicare Other | Source: Ambulatory Visit | Attending: Pulmonary Disease | Admitting: Pulmonary Disease

## 2020-07-02 ENCOUNTER — Institutional Professional Consult (permissible substitution): Payer: Medicare Other | Admitting: Pulmonary Disease

## 2020-07-02 DIAGNOSIS — I480 Paroxysmal atrial fibrillation: Secondary | ICD-10-CM

## 2020-07-02 DIAGNOSIS — I10 Essential (primary) hypertension: Secondary | ICD-10-CM | POA: Diagnosis not present

## 2020-07-02 DIAGNOSIS — I6523 Occlusion and stenosis of bilateral carotid arteries: Secondary | ICD-10-CM | POA: Diagnosis not present

## 2020-07-02 DIAGNOSIS — D721 Eosinophilia, unspecified: Secondary | ICD-10-CM | POA: Insufficient documentation

## 2020-07-02 DIAGNOSIS — U071 COVID-19: Secondary | ICD-10-CM | POA: Diagnosis not present

## 2020-07-02 MED ORDER — FAMOTIDINE IN NACL 20-0.9 MG/50ML-% IV SOLN: 20.0000 mg | Freq: Once | INTRAVENOUS | Status: DC | PRN

## 2020-07-02 MED ORDER — DIPHENHYDRAMINE HCL 50 MG/ML IJ SOLN: 50.0000 mg | Freq: Once | INTRAMUSCULAR | Status: DC | PRN

## 2020-07-02 MED ORDER — ALBUTEROL SULFATE HFA 108 (90 BASE) MCG/ACT IN AERS: 2.0000 | INHALATION_SPRAY | Freq: Once | RESPIRATORY_TRACT | Status: DC | PRN

## 2020-07-02 MED ORDER — METHYLPREDNISOLONE SODIUM SUCC 125 MG IJ SOLR: 125.0000 mg | Freq: Once | INTRAMUSCULAR | Status: DC | PRN

## 2020-07-02 MED ORDER — SOTROVIMAB 500 MG/8ML IV SOLN
500.0000 mg | Freq: Once | INTRAVENOUS | Status: AC
  Administered 2020-07-02: 500 mg via INTRAVENOUS

## 2020-07-02 MED ORDER — SODIUM CHLORIDE 0.9 % IV SOLN: INTRAVENOUS | Status: DC | PRN

## 2020-07-02 MED ORDER — EPINEPHRINE 0.3 MG/0.3ML IJ SOAJ: 0.3000 mg | Freq: Once | INTRAMUSCULAR | Status: DC | PRN

## 2020-07-02 NOTE — Progress Notes (Signed)
Patient reviewed Fact Sheet for Patients, Parents, and Caregivers for Emergency Use Authorization (EUA) of sotrovimab for the Treatment of Coronavirus. Patient also reviewed and is agreeable to the estimated cost of treatment. Patient is agreeable to proceed.   

## 2020-07-02 NOTE — Progress Notes (Signed)
Diagnosis: COVID-19  Physician: Dr. Patrick Wright  Procedure: Covid Infusion Clinic Med: Sotrovimab infusion - Provided patient with sotrovimab fact sheet for patients, parents, and caregivers prior to infusion.   Complications: No immediate complications noted  Discharge: Discharged home    

## 2020-07-02 NOTE — Discharge Instructions (Signed)
10 Things You Can Do to Manage Your COVID-19 Symptoms at Home If you have possible or confirmed COVID-19: 1. Stay home except to get medical care. 2. Monitor your symptoms carefully. If your symptoms get worse, call your healthcare provider immediately. 3. Get rest and stay hydrated. 4. If you have a medical appointment, call the healthcare provider ahead of time and tell them that you have or may have COVID-19. 5. For medical emergencies, call 911 and notify the dispatch personnel that you have or may have COVID-19. 6. Cover your cough and sneezes with a tissue or use the inside of your elbow. 7. Wash your hands often with soap and water for at least 20 seconds or clean your hands with an alcohol-based hand sanitizer that contains at least 60% alcohol. 8. As much as possible, stay in a specific room and away from other people in your home. Also, you should use a separate bathroom, if available. If you need to be around other people in or outside of the home, wear a mask. 9. Avoid sharing personal items with other people in your household, like dishes, towels, and bedding. 10. Clean all surfaces that are touched often, like counters, tabletops, and doorknobs. Use household cleaning sprays or wipes according to the label instructions. cdc.gov/coronavirus 01/06/2020 This information is not intended to replace advice given to you by your health care provider. Make sure you discuss any questions you have with your health care provider. Document Revised: 04/23/2020 Document Reviewed: 04/23/2020 Elsevier Patient Education  2021 Elsevier Inc.  What types of side effects do monoclonal antibody drugs cause?  Common side effects  In general, the more common side effects caused by monoclonal antibody drugs include: . Allergic reactions, such as hives or itching . Flu-like signs and symptoms, including chills, fatigue, fever, and muscle aches and pains . Nausea, vomiting . Diarrhea . Skin  rashes . Low blood pressure   The CDC is recommending patients who receive monoclonal antibody treatments wait at least 90 days before being vaccinated.  Currently, there are no data on the safety and efficacy of mRNA COVID-19 vaccines in persons who received monoclonal antibodies or convalescent plasma as part of COVID-19 treatment. Based on the estimated half-life of such therapies as well as evidence suggesting that reinfection is uncommon in the 90 days after initial infection, vaccination should be deferred for at least 90 days, as a precautionary measure until additional information becomes available, to avoid interference of the antibody treatment with vaccine-induced immune responses.  If you have any questions or concerns after the infusion please call the Advanced Practice Provider on call at [phone]. This number is ONLY intended for your use regarding questions or concerns about the infusion post-treatment side-effects.  Please do not provide this number to others for use. For return to work notes please contact your primary care provider.   If someone you know is interested in receiving treatment please have them call the COVID hotline at [phone].    

## 2020-08-06 ENCOUNTER — Encounter: Payer: Self-pay | Admitting: Pulmonary Disease

## 2020-08-06 ENCOUNTER — Other Ambulatory Visit: Payer: Self-pay | Admitting: Cardiovascular Disease

## 2020-08-06 ENCOUNTER — Other Ambulatory Visit: Payer: Self-pay

## 2020-08-06 ENCOUNTER — Other Ambulatory Visit: Payer: Self-pay | Admitting: Family

## 2020-08-06 ENCOUNTER — Other Ambulatory Visit
Admission: RE | Admit: 2020-08-06 | Discharge: 2020-08-06 | Disposition: A | Discharge status: Home / Self Care | Payer: Medicare Other | Source: Ambulatory Visit | Attending: Pulmonary Disease | Admitting: Pulmonary Disease

## 2020-08-06 ENCOUNTER — Ambulatory Visit (INDEPENDENT_AMBULATORY_CARE_PROVIDER_SITE_OTHER): Payer: Medicare Other | Admitting: Pulmonary Disease

## 2020-08-06 VITALS — BP 134/80 | HR 66 | Temp 97.3°F | Ht 66.0 in | Wt 185.0 lb

## 2020-08-06 DIAGNOSIS — Z8616 Personal history of COVID-19: Secondary | ICD-10-CM

## 2020-08-06 DIAGNOSIS — D721 Eosinophilia, unspecified: Secondary | ICD-10-CM

## 2020-08-06 DIAGNOSIS — J45909 Unspecified asthma, uncomplicated: Secondary | ICD-10-CM

## 2020-08-06 DIAGNOSIS — R0602 Shortness of breath: Secondary | ICD-10-CM | POA: Diagnosis not present

## 2020-08-06 DIAGNOSIS — E785 Hyperlipidemia, unspecified: Secondary | ICD-10-CM

## 2020-08-06 LAB — CBC WITH DIFFERENTIAL/PLATELET
Abs Immature Granulocytes: 0.02 10*3/uL (ref 0.00–0.07)
Basophils Absolute: 0.1 10*3/uL (ref 0.0–0.1)
Basophils Relative: 1 %
Eosinophils Absolute: 0.6 10*3/uL — ABNORMAL HIGH (ref 0.0–0.5)
Eosinophils Relative: 7 %
HCT: 41.3 % (ref 39.0–52.0)
Hemoglobin: 13.8 g/dL (ref 13.0–17.0)
Immature Granulocytes: 0 %
Lymphocytes Relative: 40 %
Lymphs Abs: 3.6 10*3/uL (ref 0.7–4.0)
MCH: 31.7 pg (ref 26.0–34.0)
MCHC: 33.4 g/dL (ref 30.0–36.0)
MCV: 94.9 fL (ref 80.0–100.0)
Monocytes Absolute: 0.9 10*3/uL (ref 0.1–1.0)
Monocytes Relative: 9 %
Neutro Abs: 3.8 10*3/uL (ref 1.7–7.7)
Neutrophils Relative %: 43 %
Platelets: 276 10*3/uL (ref 150–400)
RBC: 4.35 MIL/uL (ref 4.22–5.81)
RDW: 14.2 % (ref 11.5–15.5)
WBC: 9 10*3/uL (ref 4.0–10.5)
nRBC: 0 % (ref 0.0–0.2)

## 2020-08-06 MED ORDER — ALBUTEROL SULFATE HFA 108 (90 BASE) MCG/ACT IN AERS
2.0000 | INHALATION_SPRAY | Freq: Four times a day (QID) | RESPIRATORY_TRACT | 2 refills | Status: DC | PRN
Start: 2020-08-06 — End: 2020-08-24
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
## 2020-08-06 NOTE — Patient Instructions (Signed)
We are going to get breathing tests.  We have ordered some blood test today.  We have given you a prescription of albuterol which is rescue inhaler.  Use it whenever you feel short of breath you can use it up to 4 times a day.  Keep a record of how often you are requiring it.  Plus plan for medication to control your symptoms.  We will see you in follow-up in 2 months time call sooner should any problems arise.

## 2020-08-06 NOTE — Telephone Encounter (Signed)
Please schedule 6 month F/U appointment with Dr. Fletcher Anon. Thank you!

## 2020-08-07 ENCOUNTER — Encounter: Payer: Self-pay | Admitting: Pulmonary Disease

## 2020-08-08 NOTE — Telephone Encounter (Signed)
Please see below.

## 2020-08-08 NOTE — Telephone Encounter (Signed)
Scheduled for 2/21

## 2020-08-13 ENCOUNTER — Encounter: Payer: Self-pay | Admitting: Nurse Practitioner

## 2020-08-13 ENCOUNTER — Other Ambulatory Visit: Payer: Self-pay

## 2020-08-13 ENCOUNTER — Ambulatory Visit (INDEPENDENT_AMBULATORY_CARE_PROVIDER_SITE_OTHER): Payer: Medicare Other | Admitting: Nurse Practitioner

## 2020-08-13 VITALS — BP 140/88 | HR 72 | Ht 67.0 in | Wt 180.5 lb

## 2020-08-13 DIAGNOSIS — E782 Mixed hyperlipidemia: Secondary | ICD-10-CM

## 2020-08-13 DIAGNOSIS — I6523 Occlusion and stenosis of bilateral carotid arteries: Secondary | ICD-10-CM | POA: Diagnosis not present

## 2020-08-13 DIAGNOSIS — I480 Paroxysmal atrial fibrillation: Secondary | ICD-10-CM | POA: Diagnosis not present

## 2020-08-13 DIAGNOSIS — I10 Essential (primary) hypertension: Secondary | ICD-10-CM
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
## 2020-08-13 NOTE — Patient Instructions (Signed)
Medication Instructions:  No changes  *If you need a refill on your cardiac medications before your next appointment, please call your pharmacy*   Lab Work: None  If you have labs (blood work) drawn today and your tests are completely normal, you will receive your results only by: Marland Kitchen MyChart Message (if you have MyChart) OR . A paper copy in the mail If you have any lab test that is abnormal or we need to change your treatment, we will call you to review the results.   Testing/Procedures: None   Follow-Up: At [facility], you and your health needs are our priority.  As part of our continuing mission to provide you with exceptional heart care, we have created designated Provider Care Teams.  These Care Teams include your primary Cardiologist (physician) and Advanced Practice Providers (APPs -  Physician Assistants and Nurse Practitioners) who all work together to provide you with the care you need, when you need it.   Your next appointment:   6 month(s)  The format for your next appointment:   In Person  Provider:   Kathlyn Sacramento, MD or Murray Hodgkins, NP

## 2020-08-23 DIAGNOSIS — M5442 Lumbago with sciatica, left side: Secondary | ICD-10-CM | POA: Diagnosis not present

## 2020-08-23 DIAGNOSIS — G8929 Other chronic pain: Secondary | ICD-10-CM | POA: Diagnosis not present

## 2020-08-24 ENCOUNTER — Encounter: Payer: Self-pay | Admitting: Family

## 2020-08-24 ENCOUNTER — Ambulatory Visit (INDEPENDENT_AMBULATORY_CARE_PROVIDER_SITE_OTHER): Payer: Medicare Other | Admitting: Family

## 2020-08-24 ENCOUNTER — Other Ambulatory Visit: Payer: Self-pay

## 2020-08-24 DIAGNOSIS — M25552 Pain in left hip: Secondary | ICD-10-CM | POA: Diagnosis not present

## 2020-08-24 DIAGNOSIS — I6523 Occlusion and stenosis of bilateral carotid arteries: Secondary | ICD-10-CM | POA: Diagnosis not present

## 2020-08-24 DIAGNOSIS — E7800 Pure hypercholesterolemia, unspecified: Secondary | ICD-10-CM

## 2020-08-24 DIAGNOSIS — D721 Eosinophilia, unspecified: Secondary | ICD-10-CM

## 2020-08-24 DIAGNOSIS — I480 Paroxysmal atrial fibrillation: Secondary | ICD-10-CM

## 2020-08-24 NOTE — Assessment & Plan Note (Signed)
Reviewed work up with patient today. Eosinophils decreased 08/06/20. Will continue to monitor.

## 2020-08-24 NOTE — Assessment & Plan Note (Signed)
Left low back and hip pain is chronic, controlled. Episodic and doesn't affect QOL. He has seen Dr Cari Caraway, Dr Roland Rack. He will continue to monitor for symptoms.

## 2020-08-24 NOTE — Assessment & Plan Note (Signed)
Symptomatically controlled. Continue cardizem 240mg  and continued follow up with Dr Fletcher Anon.

## 2020-08-24 NOTE — Patient Instructions (Signed)
Nice to see you!   

## 2020-08-24 NOTE — Assessment & Plan Note (Signed)
Controlled. Continue lipitor 80mg 
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
## 2020-08-28 ENCOUNTER — Other Ambulatory Visit: Payer: Self-pay | Admitting: Pulmonary Disease

## 2020-09-25 ENCOUNTER — Telehealth: Payer: Self-pay

## 2020-09-25 NOTE — Telephone Encounter (Signed)
Patient is aware of date/time of covid test prior to PFT.  

## 2020-09-28 ENCOUNTER — Other Ambulatory Visit
Admission: RE | Admit: 2020-09-28 | Discharge: 2020-09-28 | Disposition: A | Discharge status: Home / Self Care | Payer: Medicare Other | Source: Ambulatory Visit | Attending: Pulmonary Disease | Admitting: Pulmonary Disease

## 2020-09-28 ENCOUNTER — Other Ambulatory Visit: Payer: Self-pay

## 2020-09-28 DIAGNOSIS — Z20822 Contact with and (suspected) exposure to covid-19: Secondary | ICD-10-CM | POA: Insufficient documentation

## 2020-09-28 DIAGNOSIS — Z01812 Encounter for preprocedural laboratory examination: Secondary | ICD-10-CM | POA: Diagnosis not present

## 2020-09-28 LAB — SARS CORONAVIRUS 2 (TAT 6-24 HRS): SARS Coronavirus 2: NEGATIVE

## 2020-10-01 ENCOUNTER — Other Ambulatory Visit: Payer: Self-pay

## 2020-10-01 ENCOUNTER — Ambulatory Visit: Payer: Medicare Other | Attending: Pulmonary Disease

## 2020-10-01 DIAGNOSIS — R0602 Shortness of breath: Secondary | ICD-10-CM | POA: Diagnosis not present

## 2020-11-12 ENCOUNTER — Other Ambulatory Visit: Payer: Self-pay | Admitting: Cardiovascular Disease

## 2020-11-16 ENCOUNTER — Telehealth: Payer: Self-pay

## 2020-11-16 MED ORDER — LEVALBUTEROL TARTRATE 45 MCG/ACT IN AERO: 2.0000 | INHALATION_SPRAY | Freq: Three times a day (TID) | RESPIRATORY_TRACT | 12 refills | Status: AC

## 2020-11-16 NOTE — Telephone Encounter (Signed)
Created in Error

## 2020-11-16 NOTE — Telephone Encounter (Signed)
Spoke to patient , patient asked if I could call him back at a later time, agreed to do so.

## 2020-11-20 ENCOUNTER — Telehealth: Payer: Self-pay | Admitting: Nurse Practitioner

## 2020-11-20 ENCOUNTER — Other Ambulatory Visit: Payer: Self-pay

## 2020-11-20 MED ORDER — DILTIAZEM HCL 30 MG PO TABS: 30.0000 mg | ORAL_TABLET | ORAL | 3 refills | Status: DC | PRN

## 2020-11-20 NOTE — Telephone Encounter (Signed)
Patient states that he was awoken this morning and his heart rate was 111. STAT if HR is under 50 or over 120 (normal HR is 60-100 beats per minute)  1) What is your heart rate?  73. Early when he awoke was 111  2) Do you have a log of your heart rate readings (document readings)? no  3) Do you have any other symptoms? No, patient needs his diltiazem refilled. Request sent to refill pool.  Please call to discuss

## 2020-11-20 NOTE — Telephone Encounter (Signed)
I spoke with the patient. He advised that he was out in the heat doing some work on Saturday and had an ~ 4 hour episode of A-fib.  He pushed fluids and converted back to NSR on his own, but was tired.  Yesterday, he had been outside for a good part of the day, but was helping his grand duaghter last night with a horse and felt his heart fluttering again. He had her go get his BP cuff and his BP(HR) were 157/108 (133). 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 He did drink more water during this time as well.   This morning ~ 5:00 am he was awoken by a bad dream. He got up to use the rest room and felt his heart racing again. HR was 111 at the time. He sat up in the bed for 30-40 minutes and his rates varied from 70-100 bpm during that time.  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  He checked his BP (HR) prior to calling the office today and he was 123/73 (69).  The patient confirms he is taking: Diltiazem 240 mg once daily Xarelto 20 mg once daily  Flecainide 50 mg twice daily   He does report his diltiazem 30 mg tablets were expired, so he needs a refill on this.   I advised the patient his refill has been sent in for the diltiazem 30 mg tablets. I have also advised him when he is out in the heat and has an episode of a-fib, he may want to keep some gatorade/ powerade on hand as well.  He reports that he has seen his SBP < 100 during some a-fib episodes. I advised that gatorade may be better at those times.   He is aware to call the office if he using the short acting diltiazem as needed, but his AF persists longer than 8 hours.  The patient voices understanding and is agreeable.

## 2020-11-20 NOTE — Telephone Encounter (Signed)
*  STAT* If patient is at the pharmacy, call can be transferred to refill team.   1. Which medications need to be refilled? (please list name of each medication and dose if known) Diltiazem  2. Which pharmacy/location (including street and city if local pharmacy) is medication to be sent to? [facility] [place]  3. Do they need a 30 day or 90 day supply? [place]

## 2020-11-29 ENCOUNTER — Other Ambulatory Visit: Payer: Self-pay | Admitting: Cardiovascular Disease

## 2020-11-29 ENCOUNTER — Other Ambulatory Visit: Payer: Self-pay | Admitting: Family

## 2020-11-29 DIAGNOSIS — E785 Hyperlipidemia, unspecified: Secondary | ICD-10-CM

## 2020-11-29 NOTE — Telephone Encounter (Signed)
Prescription refill request for Xarelto received.  Indication: PAF Last office visit: 08/13/20 C. Burge Weight: 81.9kg Age: 70 Scr: 1.25 CrCl: 65.52  Based on above findings Xarelto 20mg  daily is the appropriate dose.  Refill approved.

## 2020-12-07 ENCOUNTER — Other Ambulatory Visit: Payer: Self-pay | Admitting: Cardiovascular Disease

## 2021-01-22 ENCOUNTER — Other Ambulatory Visit: Payer: Self-pay | Admitting: *Deleted

## 2021-01-22 ENCOUNTER — Telehealth: Payer: Self-pay | Admitting: Cardiovascular Disease

## 2021-01-22 MED ORDER — DILTIAZEM HCL ER COATED BEADS 240 MG PO CP24: ORAL_CAPSULE | ORAL | 0 refills | Status: DC

## 2021-01-22 NOTE — Telephone Encounter (Signed)
Requested Prescriptions   Signed Prescriptions Disp Refills   diltiazem (CARDIZEM CD) 240 MG 24 hr capsule 4 capsule 0    Sig: TAKE 1 CAPSULE BY MOUTH EVERY DAY    Authorizing Provider: Kathlyn Sacramento A    Ordering User: Britt Bottom

## 2021-01-22 NOTE — Telephone Encounter (Signed)
*  STAT* If patient is at the pharmacy, call can be transferred to refill team.   1. Which medications need to be refilled? (please list name of each medication and dose if known) Diltiazen 1 capsule daily  2. Which pharmacy/location (including street and city if local pharmacy) is [facility] [address] [place] [phone]  3. Do they need a 30 day or 90 day supply? 4 days

## 2021-02-07 ENCOUNTER — Ambulatory Visit: Payer: Medicare Other

## 2021-02-18 DIAGNOSIS — L570 Actinic keratosis: Secondary | ICD-10-CM | POA: Diagnosis not present

## 2021-02-18 DIAGNOSIS — Z85828 Personal history of other malignant neoplasm of skin: Secondary | ICD-10-CM | POA: Diagnosis not present

## 2021-02-18 DIAGNOSIS — D2271 Melanocytic nevi of right lower limb, including hip: Secondary | ICD-10-CM | POA: Diagnosis not present

## 2021-02-18 DIAGNOSIS — L821 Other seborrheic keratosis: Secondary | ICD-10-CM | POA: Diagnosis not present

## 2021-02-18 DIAGNOSIS — D2261 Melanocytic nevi of right upper limb, including shoulder: Secondary | ICD-10-CM | POA: Diagnosis not present

## 2021-02-18 DIAGNOSIS — D2262 Melanocytic nevi of left upper limb, including shoulder: Secondary | ICD-10-CM | POA: Diagnosis not present

## 2021-02-18 DIAGNOSIS — X32XXXA Exposure to sunlight, initial encounter: Secondary | ICD-10-CM | POA: Diagnosis not present

## 2021-02-25 ENCOUNTER — Other Ambulatory Visit: Payer: Self-pay | Admitting: Cardiovascular Disease

## 2021-02-26 ENCOUNTER — Other Ambulatory Visit: Payer: Self-pay

## 2021-02-26 ENCOUNTER — Ambulatory Visit (INDEPENDENT_AMBULATORY_CARE_PROVIDER_SITE_OTHER): Payer: Medicare Other | Admitting: Cardiovascular Disease

## 2021-02-26 ENCOUNTER — Encounter: Payer: Self-pay | Admitting: Cardiovascular Disease

## 2021-02-26 VITALS — BP 140/70 | HR 76 | Ht 66.0 in | Wt 178.0 lb

## 2021-02-26 DIAGNOSIS — R0602 Shortness of breath: Secondary | ICD-10-CM

## 2021-02-26 DIAGNOSIS — I779 Disorder of arteries and arterioles, unspecified: Secondary | ICD-10-CM

## 2021-02-26 DIAGNOSIS — I10 Essential (primary) hypertension: Secondary | ICD-10-CM

## 2021-02-26 DIAGNOSIS — I6523 Occlusion and stenosis of bilateral carotid arteries: Secondary | ICD-10-CM | POA: Diagnosis not present

## 2021-02-26 DIAGNOSIS — E785 Hyperlipidemia, unspecified: Secondary | ICD-10-CM

## 2021-02-26 DIAGNOSIS — I480 Paroxysmal atrial fibrillation: Secondary | ICD-10-CM

## 2021-02-26 DIAGNOSIS — I2510 Atherosclerotic heart disease of native coronary artery without angina pectoris: Secondary | ICD-10-CM

## 2021-02-26 MED ORDER — DILTIAZEM HCL 30 MG PO TABS: 30.0000 mg | ORAL_TABLET | ORAL | 3 refills | Status: DC | PRN

## 2021-02-26 MED ORDER — OMEPRAZOLE 20 MG PO CPDR: 20.0000 mg | DELAYED_RELEASE_CAPSULE | Freq: Every day | ORAL | 2 refills | Status: DC

## 2021-02-26 MED ORDER — DILTIAZEM HCL ER COATED BEADS 240 MG PO CP24: ORAL_CAPSULE | ORAL | 3 refills | Status: DC
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
## 2021-02-26 NOTE — Patient Instructions (Signed)
Medication Instructions:  Your physician recommends that you continue on your current medications as directed. Please refer to the Current Medication list given to you today.  *If you need a refill on your cardiac medications before your next appointment, please call your pharmacy*   Lab Work: None ordered  If you have labs (blood work) drawn today and your tests are completely normal, you will receive your results only by: [place] (if you have MyChart) OR A paper copy in the mail If you have any lab test that is abnormal or we need to change your treatment, we will call you to review the results.   Testing/Procedures: Your physician has requested that you have an echocardiogram. Echocardiography is a painless test that uses sound waves to create images of your heart. It provides your doctor with information about the size and shape of your heart and how well your heart's chambers and valves are working. This procedure takes approximately one hour. There are no restrictions for this procedure.  Your physician has requested that you have a carotid duplex. This test is an ultrasound of the carotid arteries in your neck. It looks at blood flow through these arteries that supply the brain with blood. Allow one hour for this exam. There are no restrictions or special instructions.     Follow-Up: At [facility], you and your health needs are our priority.  As part of our continuing mission to provide you with exceptional heart care, we have created designated Provider Care Teams.  These Care Teams include your primary Cardiologist (physician) and Advanced Practice Providers (APPs -  Physician Assistants and Nurse Practitioners) who all work together to provide you with the care you need, when you need it.  We recommend signing up for the patient portal called "MyChart".  Sign up information is provided on this After Visit Summary.  MyChart is used to connect with patients for Virtual  Visits (Telemedicine).  Patients are able to view lab/test results, encounter notes, upcoming appointments, etc.  Non-urgent messages can be sent to your provider as well.   To learn more about what you can do with MyChart, go to [email or web address].    Your next appointment:   6 month(s)  The format for your next appointment:   In Person  Provider:   You may see Kathlyn Sacramento, MD or one of the following Advanced Practice Providers on your designated Care Team:   Murray Hodgkins, NP Christell Faith, PA-C Marrianne Mood, PA-C Cadence Kathlen Mody, [place]   Other Instructions N/A

## 2021-03-27 ENCOUNTER — Ambulatory Visit (INDEPENDENT_AMBULATORY_CARE_PROVIDER_SITE_OTHER): Payer: Medicare Other

## 2021-03-27 ENCOUNTER — Other Ambulatory Visit: Payer: Self-pay

## 2021-03-27 VITALS — BP 137/73 | HR 65 | Ht 66.0 in | Wt 178.0 lb

## 2021-03-27 DIAGNOSIS — I6523 Occlusion and stenosis of bilateral carotid arteries: Secondary | ICD-10-CM | POA: Diagnosis not present

## 2021-03-27 DIAGNOSIS — I779 Disorder of arteries and arterioles, unspecified: Secondary | ICD-10-CM | POA: Diagnosis not present

## 2021-03-27 DIAGNOSIS — Z0000 Encounter for general adult medical examination without abnormal findings: Secondary | ICD-10-CM

## 2021-03-27 DIAGNOSIS — R0602 Shortness of breath: Secondary | ICD-10-CM

## 2021-03-27 LAB — ECHOCARDIOGRAM COMPLETE
AR max vel: 3.86 cm2
AV Area VTI: 3.97 cm2
AV Area mean vel: 4 cm2
AV Mean grad: 2 mmHg
AV Peak grad: 3.1 mmHg
Ao pk vel: 0.88 m/s
Area-P 1/2: 3.42 cm2
Calc EF: 54.6 %
S' Lateral: 2.6 cm
Single Plane A2C EF: 55.6 %
Single Plane A4C EF: 52.9 %
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
## 2021-03-27 NOTE — Patient Instructions (Addendum)
  Dalton Sanders , Thank you for taking time to come for your Medicare Wellness Visit. I appreciate your ongoing commitment to your health goals. Please review the following plan we discussed and let me know if I can assist you in the future.   These are the goals we discussed:  Goals      Follow up with Primary Care Provider        This is a list of the screening recommended for you and due dates:  Health Maintenance  Topic Date Due   Zoster (Shingles) Vaccine (1 of 2) 06/27/2021*   Colon Cancer Screening  01/29/2025   Tetanus Vaccine  07/03/2025   Hepatitis C Screening: USPSTF Recommendation to screen - Ages 18-79 yo.  Completed   HPV Vaccine  Aged Out   Flu Shot  Discontinued   COVID-19 Vaccine  Discontinued  *Topic was postponed. The date shown is not the original due date.

## 2021-03-28 ENCOUNTER — Other Ambulatory Visit: Payer: Self-pay

## 2021-03-28 DIAGNOSIS — I6523 Occlusion and stenosis of bilateral carotid arteries: Secondary | ICD-10-CM

## 2021-04-07 ENCOUNTER — Emergency Department
Admission: EM | Admit: 2021-04-07 | Discharge: 2021-04-07 | Disposition: A | Discharge status: Home / Self Care | Payer: Medicare Other | Attending: Emergency Medicine | Admitting: Emergency Medicine

## 2021-04-07 DIAGNOSIS — I4891 Unspecified atrial fibrillation: Secondary | ICD-10-CM | POA: Diagnosis not present

## 2021-04-07 DIAGNOSIS — R079 Chest pain, unspecified: Secondary | ICD-10-CM | POA: Diagnosis not present

## 2021-04-07 DIAGNOSIS — E876 Hypokalemia: Secondary | ICD-10-CM | POA: Diagnosis not present

## 2021-04-07 DIAGNOSIS — Z7901 Long term (current) use of anticoagulants: Secondary | ICD-10-CM | POA: Diagnosis not present

## 2021-04-07 DIAGNOSIS — I2510 Atherosclerotic heart disease of native coronary artery without angina pectoris: Secondary | ICD-10-CM | POA: Diagnosis not present

## 2021-04-07 DIAGNOSIS — I10 Essential (primary) hypertension: Secondary | ICD-10-CM | POA: Insufficient documentation

## 2021-04-07 DIAGNOSIS — I499 Cardiac arrhythmia, unspecified: Secondary | ICD-10-CM | POA: Diagnosis not present

## 2021-04-07 DIAGNOSIS — Z85828 Personal history of other malignant neoplasm of skin: Secondary | ICD-10-CM | POA: Insufficient documentation

## 2021-04-07 DIAGNOSIS — R002 Palpitations: Secondary | ICD-10-CM | POA: Diagnosis present

## 2021-04-07 DIAGNOSIS — J45909 Unspecified asthma, uncomplicated: Secondary | ICD-10-CM | POA: Diagnosis not present

## 2021-04-07 DIAGNOSIS — I480 Paroxysmal atrial fibrillation: Secondary | ICD-10-CM

## 2021-04-07 LAB — BASIC METABOLIC PANEL
Anion gap: 13 (ref 5–15)
BUN: 19 mg/dL (ref 8–23)
CO2: 20 mmol/L — ABNORMAL LOW (ref 22–32)
Calcium: 8.7 mg/dL — ABNORMAL LOW (ref 8.9–10.3)
Chloride: 103 mmol/L (ref 98–111)
Creatinine, Ser: 1.35 mg/dL — ABNORMAL HIGH (ref 0.61–1.24)
GFR, Estimated: 57 mL/min — ABNORMAL LOW (ref >60)
Glucose, Bld: 120 mg/dL — ABNORMAL HIGH (ref 70–99)
Potassium: 2.9 mmol/L — ABNORMAL LOW (ref 3.5–5.1)
Sodium: 136 mmol/L (ref 135–145)

## 2021-04-07 LAB — CBC WITH DIFFERENTIAL/PLATELET
Abs Immature Granulocytes: 0.03 10*3/uL (ref 0.00–0.07)
Basophils Absolute: 0.1 10*3/uL (ref 0.0–0.1)
Basophils Relative: 1 %
Eosinophils Absolute: 0.4 10*3/uL (ref 0.0–0.5)
Eosinophils Relative: 3 %
HCT: 35.1 % — ABNORMAL LOW (ref 39.0–52.0)
Hemoglobin: 12.3 g/dL — ABNORMAL LOW (ref 13.0–17.0)
Immature Granulocytes: 0 %
Lymphocytes Relative: 28 %
Lymphs Abs: 3.2 10*3/uL (ref 0.7–4.0)
MCH: 31.9 pg (ref 26.0–34.0)
MCHC: 35 g/dL (ref 30.0–36.0)
MCV: 90.9 fL (ref 80.0–100.0)
Monocytes Absolute: 1 10*3/uL (ref 0.1–1.0)
Monocytes Relative: 9 %
Neutro Abs: 6.6 10*3/uL (ref 1.7–7.7)
Neutrophils Relative %: 59 %
Platelets: 248 10*3/uL (ref 150–400)
RBC: 3.86 MIL/uL — ABNORMAL LOW (ref 4.22–5.81)
RDW: 14.5 % (ref 11.5–15.5)
WBC: 11.4 10*3/uL — ABNORMAL HIGH (ref 4.0–10.5)
nRBC: 0 % (ref 0.0–0.2)

## 2021-04-07 MED ORDER — DILTIAZEM HCL 25 MG/5ML IV SOLN
10.0000 mg | Freq: Once | INTRAVENOUS | Status: AC
  Administered 2021-04-07: 10 mg via INTRAVENOUS
  Filled 2021-04-07: qty 5

## 2021-04-07 MED ORDER — POTASSIUM CHLORIDE CRYS ER 20 MEQ PO TBCR: 20.0000 meq | EXTENDED_RELEASE_TABLET | Freq: Every day | ORAL | 0 refills | Status: DC

## 2021-04-07 MED ORDER — POTASSIUM CHLORIDE CRYS ER 20 MEQ PO TBCR
40.0000 meq | EXTENDED_RELEASE_TABLET | Freq: Once | ORAL | Status: AC
  Administered 2021-04-07: 40 meq via ORAL
  Filled 2021-04-07: qty 2

## 2021-04-07 MED ORDER — LACTATED RINGERS IV BOLUS
500.0000 mL | Freq: Once | INTRAVENOUS | Status: AC
  Administered 2021-04-07: 500 mL via INTRAVENOUS

## 2021-04-07 MED ORDER — POTASSIUM CHLORIDE 10 MEQ/100ML IV SOLN
10.0000 meq | Freq: Once | INTRAVENOUS | Status: AC
  Administered 2021-04-07: 10 meq via INTRAVENOUS
  Filled 2021-04-07: qty 100

## 2021-04-07 NOTE — ED Triage Notes (Addendum)
Pt endorsing midsternal chest pressure tonight at 1950. Pt took VS with Hr in high 140's pt also hypertensive. Pt arrived with 18g in RAC. Pt stated that they took 650 aspirin at home as well as their 30mg  fast asking diltiazem prior to ems arrival. Pt alert and oriented on monitor. Pt stating they they worked harder today than normal and did not drink as much water as he feels he should have.
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
## 2021-06-05 ENCOUNTER — Other Ambulatory Visit: Payer: Self-pay | Admitting: Family

## 2021-06-05 DIAGNOSIS — E785 Hyperlipidemia, unspecified: Secondary | ICD-10-CM

## 2021-06-14 ENCOUNTER — Other Ambulatory Visit: Payer: Self-pay | Admitting: Cardiovascular Disease

## 2021-06-14 DIAGNOSIS — I480 Paroxysmal atrial fibrillation: Secondary | ICD-10-CM

## 2021-06-18 NOTE — Telephone Encounter (Signed)
Refill Request.  

## 2021-06-18 NOTE — Telephone Encounter (Signed)
Prescription refill request for Xarelto received.  Indication: a fib Last office visit: 02/26/21  CrCl: 73mL/min

## 2021-06-25 ENCOUNTER — Inpatient Hospital Stay: Payer: Medicare Other | Attending: Internal Medicine | Admitting: Internal Medicine

## 2021-06-25 ENCOUNTER — Other Ambulatory Visit: Payer: Self-pay

## 2021-06-25 ENCOUNTER — Encounter: Payer: Self-pay | Admitting: Internal Medicine

## 2021-06-25 DIAGNOSIS — I480 Paroxysmal atrial fibrillation: Secondary | ICD-10-CM | POA: Insufficient documentation

## 2021-06-25 DIAGNOSIS — Z8042 Family history of malignant neoplasm of prostate: Secondary | ICD-10-CM | POA: Diagnosis not present

## 2021-06-25 DIAGNOSIS — Z823 Family history of stroke: Secondary | ICD-10-CM | POA: Insufficient documentation

## 2021-06-25 DIAGNOSIS — D721 Eosinophilia, unspecified: Secondary | ICD-10-CM

## 2021-06-25 DIAGNOSIS — Z841 Family history of disorders of kidney and ureter: Secondary | ICD-10-CM | POA: Insufficient documentation

## 2021-06-25 DIAGNOSIS — I2510 Atherosclerotic heart disease of native coronary artery without angina pectoris: Secondary | ICD-10-CM | POA: Diagnosis not present

## 2021-06-25 DIAGNOSIS — E785 Hyperlipidemia, unspecified: Secondary | ICD-10-CM | POA: Insufficient documentation

## 2021-06-25 DIAGNOSIS — E876 Hypokalemia: Secondary | ICD-10-CM | POA: Diagnosis not present

## 2021-06-25 DIAGNOSIS — Z833 Family history of diabetes mellitus: Secondary | ICD-10-CM | POA: Diagnosis not present

## 2021-06-25 DIAGNOSIS — Z7901 Long term (current) use of anticoagulants: Secondary | ICD-10-CM | POA: Insufficient documentation

## 2021-06-25 DIAGNOSIS — Z79899 Other long term (current) drug therapy: Secondary | ICD-10-CM | POA: Diagnosis not present

## 2021-06-25 DIAGNOSIS — R911 Solitary pulmonary nodule: Secondary | ICD-10-CM | POA: Diagnosis not present

## 2021-06-25 DIAGNOSIS — Z8249 Family history of ischemic heart disease and other diseases of the circulatory system: Secondary | ICD-10-CM | POA: Diagnosis not present

## 2021-06-25 DIAGNOSIS — Z8261 Family history of arthritis: Secondary | ICD-10-CM | POA: Insufficient documentation
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
## 2021-06-25 NOTE — Assessment & Plan Note (Signed)
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  Patient awaiting follow-up blood work with PCP in March.  # LLL nodule- 3-4 mm-NOV 2021- STABLE [from 2017 ]; no further imaging needed.  Reviewed imaging.  # A.fib/hypokalemia [oct 3912]- on xarelto- STABLE  # DISPOSITION: # follow up as needed-Dr.B

## 2021-08-26 ENCOUNTER — Ambulatory Visit (INDEPENDENT_AMBULATORY_CARE_PROVIDER_SITE_OTHER): Payer: Medicare Other | Admitting: Family

## 2021-08-26 ENCOUNTER — Encounter: Payer: Self-pay | Admitting: Family

## 2021-08-26 ENCOUNTER — Other Ambulatory Visit: Payer: Self-pay

## 2021-08-26 VITALS — BP 136/78 | HR 61 | Temp 96.2°F | Ht 66.0 in | Wt 182.2 lb

## 2021-08-26 DIAGNOSIS — Z0000 Encounter for general adult medical examination without abnormal findings: Secondary | ICD-10-CM

## 2021-08-26 DIAGNOSIS — I480 Paroxysmal atrial fibrillation: Secondary | ICD-10-CM

## 2021-08-26 DIAGNOSIS — R350 Frequency of micturition: Secondary | ICD-10-CM | POA: Diagnosis not present

## 2021-08-26 DIAGNOSIS — Z125 Encounter for screening for malignant neoplasm of prostate: Secondary | ICD-10-CM | POA: Diagnosis not present

## 2021-08-26 DIAGNOSIS — D721 Eosinophilia, unspecified: Secondary | ICD-10-CM

## 2021-08-26 DIAGNOSIS — E7800 Pure hypercholesterolemia, unspecified: Secondary | ICD-10-CM

## 2021-08-26 DIAGNOSIS — Z862 Personal history of diseases of the blood and blood-forming organs and certain disorders involving the immune mechanism: Secondary | ICD-10-CM | POA: Diagnosis not present

## 2021-08-26 LAB — COMPREHENSIVE METABOLIC PANEL
ALT: 18 U/L (ref 0–53)
AST: 20 U/L (ref 0–37)
Albumin: 4.6 g/dL (ref 3.5–5.2)
Alkaline Phosphatase: 75 U/L (ref 39–117)
BUN: 20 mg/dL (ref 6–23)
CO2: 27 mEq/L (ref 19–32)
Calcium: 9.8 mg/dL (ref 8.4–10.5)
Chloride: 103 mEq/L (ref 96–112)
Creatinine, Ser: 1.35 mg/dL (ref 0.40–1.50)
GFR: 53.49 mL/min — ABNORMAL LOW (ref >60.00)
Glucose, Bld: 99 mg/dL (ref 70–99)
Potassium: 5.2 mEq/L — ABNORMAL HIGH (ref 3.5–5.1)
Sodium: 139 mEq/L (ref 135–145)
Total Bilirubin: 0.6 mg/dL (ref 0.2–1.2)
Total Protein: 7.6 g/dL (ref 6.0–8.3)

## 2021-08-26 LAB — PSA: PSA: 2.48 ng/mL (ref 0.10–4.00)

## 2021-08-26 LAB — CBC WITH DIFFERENTIAL/PLATELET
Basophils Absolute: 0.1 10*3/uL (ref 0.0–0.1)
Basophils Relative: 1 % (ref 0.0–3.0)
Eosinophils Absolute: 0.5 10*3/uL (ref 0.0–0.7)
Eosinophils Relative: 5.4 % — ABNORMAL HIGH (ref 0.0–5.0)
HCT: 40.1 % (ref 39.0–52.0)
Hemoglobin: 13.2 g/dL (ref 13.0–17.0)
Lymphocytes Relative: 38.2 % (ref 12.0–46.0)
Lymphs Abs: 3.2 10*3/uL (ref 0.7–4.0)
MCHC: 33 g/dL (ref 30.0–36.0)
MCV: 89.9 fl (ref 78.0–100.0)
Monocytes Absolute: 0.8 10*3/uL (ref 0.1–1.0)
Monocytes Relative: 9.2 % (ref 3.0–12.0)
Neutro Abs: 3.9 10*3/uL (ref 1.4–7.7)
Neutrophils Relative %: 46.2 % (ref 43.0–77.0)
Platelets: 262 10*3/uL (ref 150.0–400.0)
RBC: 4.46 Mil/uL (ref 4.22–5.81)
RDW: 16.7 % — ABNORMAL HIGH (ref 11.5–15.5)
WBC: 8.4 10*3/uL (ref 4.0–10.5)

## 2021-08-26 LAB — LIPID PANEL
Cholesterol: 154 mg/dL (ref 0–200)
HDL: 60.3 mg/dL (ref >39.00)
LDL Cholesterol: 68 mg/dL (ref 0–99)
NonHDL: 93.22
Total CHOL/HDL Ratio: 3
Triglycerides: 128 mg/dL (ref 0.0–149.0)
VLDL: 25.6 mg/dL (ref 0.0–40.0)

## 2021-08-26 LAB — TSH: TSH: 2.07 u[IU]/mL (ref 0.35–5.50)

## 2021-08-26 NOTE — Assessment & Plan Note (Addendum)
In setting of previous hypokalemia, advised him discuss with Dr Fletcher Anon next week regarding  taking '99mg'$  OTC potassium daily .  Etiology of hyperkalemia is unknown.  He is not on a diuretic, nor did he have  vomiting or diarrhea at that time.  Advised that it is easier to manage and prescribe potassium chloride and for his safety, taking over-the-counter potassium may result in hyperkalemia which is as dangerous as well.  I opted not to change potassium over-the-counter and advised him to see the Dr Fletcher Anon further whether or not he would recommend this.  For now, advised to continue Xarelto, diltiazem, flecainide as managed by cardiology. Pending bmp today ?

## 2021-08-26 NOTE — Assessment & Plan Note (Addendum)
Colonoscopy is up-to-date.  He declines pneumonia vaccine.  Encouraged continued exercise.   ?
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
## 2021-08-26 NOTE — Patient Instructions (Signed)
Referral to urology ? ?Let us know if you dont hear back within a week in regards to an appointment being scheduled. Health Maintenance, Male ?Adopting a healthy lifestyle and getting preventive care are important in promoting health and wellness. Ask your health care provider about: ?The right schedule for you to have regular tests and exams. ?Things you can do on your own to prevent diseases and keep yourself healthy. ?What should I know about diet, weight, and exercise? ?Eat a healthy diet ? ?Eat a diet that includes plenty of vegetables, fruits, low-fat dairy products, and lean protein. ?Do not eat a lot of foods that are high in solid fats, added sugars, or sodium. ?Maintain a healthy weight ?Body mass index (BMI) is a measurement that can be used to identify possible weight problems. It estimates body fat based on height and weight. Your health care provider can help determine your BMI and help you achieve or maintain a healthy weight. ?Get regular exercise ?Get regular exercise. This is one of the most important things you can do for your health. Most adults should: ?Exercise for at least 150 minutes each week. The exercise should increase your heart rate and make you sweat (moderate-intensity exercise). ?Do strengthening exercises at least twice a week. This is in addition to the moderate-intensity exercise. ?Spend less time sitting. Even light physical activity can be beneficial. ?Watch cholesterol and blood lipids ?Have your blood tested for lipids and cholesterol at 70 years of age, then have this test every 5 years. ?You may need to have your cholesterol levels checked more often if: ?Your lipid or cholesterol levels are high. ?You are older than 70 years of age. ?You are at high risk for heart disease. ?What should I know about cancer screening? ?Many types of cancers can be detected early and may often be prevented. Depending on your health history and family history, you may need to have cancer  screening at various ages. This may include screening for: ?Colorectal cancer. ?Prostate cancer. ?Skin cancer. ?Lung cancer. ?What should I know about heart disease, diabetes, and high blood pressure? ?Blood pressure and heart disease ?High blood pressure causes heart disease and increases the risk of stroke. This is more likely to develop in people who have high blood pressure readings or are overweight. ?Talk with your health care provider about your target blood pressure readings. ?Have your blood pressure checked: ?Every 3-5 years if you are 21-38 years of age. ?Every year if you are 77 years old or older. ?If you are between the ages of 58 and 62 and are a current or former smoker, ask your health care provider if you should have a one-time screening for abdominal aortic aneurysm (AAA). ?Diabetes ?Have regular diabetes screenings. This checks your fasting blood sugar level. Have the screening done: ?Once every three years after age 37 if you are at a normal weight and have a low risk for diabetes. ?More often and at a younger age if you are overweight or have a high risk for diabetes. ?What should I know about preventing infection? ?Hepatitis B ?If you have a higher risk for hepatitis B, you should be screened for this virus. Talk with your health care provider to find out if you are at risk for hepatitis B infection. ?Hepatitis C ?Blood testing is recommended for: ?Everyone born from [birth year] through 1964. ?Anyone with known risk factors for hepatitis C. ?Sexually transmitted infections (STIs) ?You should be screened each year for STIs, including gonorrhea and chlamydia, if: ?  You are sexually active and are younger than 70 years of age. ?You are older than 70 years of age and your health care provider tells you that you are at risk for this type of infection. ?Your sexual activity has changed since you were last screened, and you are at increased risk for chlamydia or gonorrhea. Ask your health care provider if  you are at risk. ?Ask your health care provider about whether you are at high risk for HIV. Your health care provider may recommend a prescription medicine to help prevent HIV infection. If you choose to take medicine to prevent HIV, you should first get tested for HIV. You should then be tested every 3 months for as long as you are taking the medicine. ?Follow these instructions at home: ?Alcohol use ?Do not drink alcohol if your health care provider tells you not to drink. ?If you drink alcohol: ?Limit how much you have to 0-2 drinks a day. ?Know how much alcohol is in your drink. In the [place] one drink equals one 12 oz bottle of beer (355 mL), one 5 oz glass of wine (148 mL), or one 1? oz glass of hard liquor (44 mL). ?Lifestyle ?Do not use any products that contain nicotine or tobacco. These products include cigarettes, chewing tobacco, and vaping devices, such as e-cigarettes. If you need help quitting, ask your health care provider. ?Do not use street drugs. ?Do not share needles. ?Ask your health care provider for help if you need support or information about quitting drugs. ?General instructions ?Schedule regular health, dental, and eye exams. ?Stay current with your vaccines. ?Tell your health care provider if: ?You often feel depressed. ?You have ever been abused or do not feel safe at home. ?Summary ?Adopting a healthy lifestyle and getting preventive care are important in promoting health and wellness. ?Follow your health care provider's instructions about healthy diet, exercising, and getting tested or screened for diseases. ?Follow your health care provider's instructions on monitoring your cholesterol and blood pressure. ?This information is not intended to replace advice given to you by your health care provider. Make sure you discuss any questions you have with your health care provider. ?Document Revised: 10/29/2020 Document Reviewed: 10/29/2020 ?Elsevier Patient Education ? 2022 [employer]. ? ? ?

## 2021-08-26 NOTE — Assessment & Plan Note (Signed)
Reviewed note from hematology.  Pending CBC today to trend eosinophils ?

## 2021-08-26 NOTE — Assessment & Plan Note (Signed)
Chronic, stable.  Continue atorvastatin 80 mg.  Pending Lipitor today. ?

## 2021-08-26 NOTE — Assessment & Plan Note (Signed)
Referral to urology in the setting of urinary frequency for concern for obstruction ( enlarged prostate).  Pending PSA , UA as well ? ?

## 2021-09-02 ENCOUNTER — Telehealth: Payer: Self-pay | Admitting: Family

## 2021-09-02 DIAGNOSIS — E875 Hyperkalemia: Secondary | ICD-10-CM

## 2021-09-02 NOTE — Telephone Encounter (Signed)
Pt called in stating that NP Arnett would like for pt to come back to redo lab. Pt was wondering when do NP Arnett would like for him to come back to do labs. Pt requesting callback  ?

## 2021-09-03 NOTE — Telephone Encounter (Signed)
Patient stated that he can not come in for labs before his appointment with Heart Dr on Thursday, but would just inquire about getting labs done  there  ?

## 2021-09-03 NOTE — Addendum Note (Signed)
Addended by: [place], Alrick Cubbage on: 09/03/2021 03:11 PM ? ? Modules accepted: Orders ? ?

## 2021-09-05 ENCOUNTER — Other Ambulatory Visit: Payer: Self-pay

## 2021-09-05 ENCOUNTER — Encounter: Payer: Self-pay | Admitting: Cardiovascular Disease

## 2021-09-05 ENCOUNTER — Ambulatory Visit (INDEPENDENT_AMBULATORY_CARE_PROVIDER_SITE_OTHER): Payer: Medicare Other | Admitting: Cardiovascular Disease

## 2021-09-05 VITALS — BP 130/70 | HR 68 | Ht 66.0 in | Wt 179.5 lb

## 2021-09-05 DIAGNOSIS — I779 Disorder of arteries and arterioles, unspecified: Secondary | ICD-10-CM

## 2021-09-05 DIAGNOSIS — I480 Paroxysmal atrial fibrillation: Secondary | ICD-10-CM | POA: Diagnosis not present

## 2021-09-05 DIAGNOSIS — E785 Hyperlipidemia, unspecified: Secondary | ICD-10-CM | POA: Diagnosis not present

## 2021-09-05 DIAGNOSIS — I2510 Atherosclerotic heart disease of native coronary artery without angina pectoris: Secondary | ICD-10-CM

## 2021-09-05 DIAGNOSIS — I10 Essential (primary) hypertension: Secondary | ICD-10-CM | POA: Diagnosis not present

## 2021-09-05 NOTE — Patient Instructions (Signed)
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
## 2021-09-06 ENCOUNTER — Encounter: Payer: Self-pay | Admitting: Family

## 2021-09-27 ENCOUNTER — Other Ambulatory Visit: Payer: Self-pay | Admitting: Cardiovascular Disease

## 2021-09-27 DIAGNOSIS — I480 Paroxysmal atrial fibrillation: Secondary | ICD-10-CM

## 2021-09-27 NOTE — Telephone Encounter (Signed)
Xarelto 20 mg refill request received. Pt is 71 years old, weight- 81.4 kg, Crea- 1.35 on 08/26/21, last seen by Dr. Fletcher Anon on 09/05/21, Diagnosis- PAF, CrCl- 59.46; Dose is appropriate based on dosing criteria. Will send in refill to requested pharmacy.    ?

## 2021-09-27 NOTE — Telephone Encounter (Signed)
Please review

## 2021-10-07 ENCOUNTER — Ambulatory Visit: Payer: Medicare Other | Admitting: Urology

## 2021-11-04 ENCOUNTER — Ambulatory Visit (INDEPENDENT_AMBULATORY_CARE_PROVIDER_SITE_OTHER): Payer: Medicare Other | Admitting: Urology

## 2021-11-04 ENCOUNTER — Other Ambulatory Visit: Payer: Self-pay

## 2021-11-04 VITALS — BP 168/85 | HR 71 | Ht 66.0 in | Wt 182.0 lb

## 2021-11-04 DIAGNOSIS — N189 Chronic kidney disease, unspecified: Secondary | ICD-10-CM

## 2021-11-04 DIAGNOSIS — R972 Elevated prostate specific antigen [PSA]: Secondary | ICD-10-CM | POA: Diagnosis not present

## 2021-11-04 DIAGNOSIS — I2510 Atherosclerotic heart disease of native coronary artery without angina pectoris: Secondary | ICD-10-CM

## 2021-11-04 DIAGNOSIS — N289 Disorder of kidney and ureter, unspecified: Secondary | ICD-10-CM

## 2021-11-04 DIAGNOSIS — R350 Frequency of micturition: Secondary | ICD-10-CM | POA: Diagnosis not present

## 2021-11-04 LAB — URINALYSIS, COMPLETE
Bilirubin, UA: NEGATIVE
Glucose, UA: NEGATIVE
Ketones, UA: NEGATIVE
Leukocytes,UA: NEGATIVE
Nitrite, UA: NEGATIVE
Protein,UA: NEGATIVE
Specific Gravity, UA: <1.005 — ABNORMAL LOW (ref 1.005–1.030)
Urobilinogen, Ur: 0.2 mg/dL (ref 0.2–1.0)
pH, UA: 6.5 (ref 5.0–7.5)

## 2021-11-04 LAB — MICROSCOPIC EXAMINATION: Bacteria, UA: NONE SEEN
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
## 2021-11-20 ENCOUNTER — Ambulatory Visit: Admission: RE | Admit: 2021-11-20 | Payer: Medicare Other | Source: Ambulatory Visit

## 2021-11-23 ENCOUNTER — Other Ambulatory Visit: Payer: Self-pay | Admitting: Cardiovascular Disease

## 2021-12-09 ENCOUNTER — Ambulatory Visit
Admission: RE | Admit: 2021-12-09 | Discharge: 2021-12-09 | Disposition: A | Discharge status: Home / Self Care | Payer: Medicare Other | Source: Ambulatory Visit | Attending: Urology | Admitting: Urology

## 2021-12-09 DIAGNOSIS — N189 Chronic kidney disease, unspecified: Secondary | ICD-10-CM | POA: Insufficient documentation

## 2021-12-27 ENCOUNTER — Other Ambulatory Visit: Payer: Self-pay | Admitting: Cardiovascular Disease

## 2021-12-27 ENCOUNTER — Other Ambulatory Visit: Payer: Self-pay | Admitting: Family

## 2021-12-27 DIAGNOSIS — E785 Hyperlipidemia, unspecified: Secondary | ICD-10-CM

## 2022-02-18 DIAGNOSIS — L570 Actinic keratosis: Secondary | ICD-10-CM | POA: Diagnosis not present

## 2022-02-18 DIAGNOSIS — X32XXXA Exposure to sunlight, initial encounter: Secondary | ICD-10-CM | POA: Diagnosis not present

## 2022-02-18 DIAGNOSIS — D2272 Melanocytic nevi of left lower limb, including hip: Secondary | ICD-10-CM | POA: Diagnosis not present

## 2022-02-18 DIAGNOSIS — D2261 Melanocytic nevi of right upper limb, including shoulder: Secondary | ICD-10-CM | POA: Diagnosis not present

## 2022-02-18 DIAGNOSIS — D2262 Melanocytic nevi of left upper limb, including shoulder: Secondary | ICD-10-CM | POA: Diagnosis not present

## 2022-02-18 DIAGNOSIS — Z85828 Personal history of other malignant neoplasm of skin: Secondary | ICD-10-CM | POA: Diagnosis not present

## 2022-02-19 ENCOUNTER — Other Ambulatory Visit: Payer: Self-pay | Admitting: Cardiovascular Disease

## 2022-02-26 ENCOUNTER — Telehealth: Payer: Self-pay

## 2022-02-28 ENCOUNTER — Ambulatory Visit: Payer: Medicare Other | Admitting: Family

## 2022-03-03 ENCOUNTER — Ambulatory Visit: Payer: Medicare Other | Admitting: Family

## 2022-03-10 ENCOUNTER — Other Ambulatory Visit: Payer: Medicare Other

## 2022-03-10 DIAGNOSIS — R972 Elevated prostate specific antigen [PSA]: Secondary | ICD-10-CM | POA: Diagnosis not present

## 2022-03-11 LAB — PSA TOTAL (REFLEX TO FREE): Prostate Specific Ag, Serum: 2.9 ng/mL (ref 0.0–4.0)

## 2022-03-12 ENCOUNTER — Other Ambulatory Visit (INDEPENDENT_AMBULATORY_CARE_PROVIDER_SITE_OTHER): Payer: Medicare Other

## 2022-03-12 DIAGNOSIS — E875 Hyperkalemia: Secondary | ICD-10-CM

## 2022-03-12 LAB — COMPREHENSIVE METABOLIC PANEL
ALT: 20 U/L (ref 0–53)
AST: 22 U/L (ref 0–37)
Albumin: 4 g/dL (ref 3.5–5.2)
Alkaline Phosphatase: 61 U/L (ref 39–117)
BUN: 19 mg/dL (ref 6–23)
CO2: 28 mEq/L (ref 19–32)
Calcium: 9.4 mg/dL (ref 8.4–10.5)
Chloride: 104 mEq/L (ref 96–112)
Creatinine, Ser: 1.33 mg/dL (ref 0.40–1.50)
GFR: 54.25 mL/min — ABNORMAL LOW (ref >60.00)
Glucose, Bld: 104 mg/dL — ABNORMAL HIGH (ref 70–99)
Potassium: 4.8 mEq/L (ref 3.5–5.1)
Sodium: 139 mEq/L (ref 135–145)
Total Bilirubin: 0.5 mg/dL (ref 0.2–1.2)
Total Protein: 6.8 g/dL (ref 6.0–8.3)

## 2022-03-13 ENCOUNTER — Encounter: Payer: Self-pay | Admitting: Urology

## 2022-03-13 ENCOUNTER — Ambulatory Visit: Payer: Medicare Other | Admitting: Urology

## 2022-03-13 ENCOUNTER — Ambulatory Visit (INDEPENDENT_AMBULATORY_CARE_PROVIDER_SITE_OTHER): Payer: Medicare Other | Admitting: Urology

## 2022-03-13 VITALS — BP 137/76 | HR 67 | Ht 66.0 in | Wt 182.0 lb

## 2022-03-13 DIAGNOSIS — N189 Chronic kidney disease, unspecified: Secondary | ICD-10-CM

## 2022-03-13 DIAGNOSIS — Z125 Encounter for screening for malignant neoplasm of prostate: Secondary | ICD-10-CM | POA: Diagnosis not present

## 2022-03-13 DIAGNOSIS — R972 Elevated prostate specific antigen [PSA]: Secondary | ICD-10-CM | POA: Diagnosis not present

## 2022-03-13 NOTE — Progress Notes (Signed)
03/13/2022 3:40 PM   [name] 01-05-1951 [id number]  Reason for visit: Follow up PSA screening, CKD  HPI: I saw Mr. Seubert today for the first time for the above issues.  He was previously followed by Dr. Vikki Ports.  He is a 71 year old very healthy male.  Most recent PSA on 03/10/2022 was 2.9 which is essentially stable.  Prior PSA values include 2.48 in March 2023, 1.86 in December 2019, 2.03 in December 2017, and 2.17 in August 2015.  We reviewed the AUA guidelines regarding screening, and that routine screening is not recommended in men over age 74.  With his excellent health he would like to continue screening which is not unreasonable.  In terms of his renal function, most recent creatinine stable at 1.33, EGFR 54 which has been stable really over the last few years.  I personally viewed and interpreted the renal ultrasound from June 2023 that shows no hydronephrosis or other urologic abnormalities.  Reassurance was provided, and we discussed strategies to maintain renal function including adequate hydration and avoiding NSAIDs.  Patient opts to continue yearly PSA screening, RTC 1 year [place], MD  [facility] [address], [place], [place] [address] [phone]

## 2022-03-13 NOTE — Patient Instructions (Signed)

## 2022-03-14 ENCOUNTER — Encounter: Payer: Self-pay | Admitting: Cardiovascular Disease

## 2022-03-14 ENCOUNTER — Ambulatory Visit: Payer: Medicare Other | Attending: Cardiovascular Disease | Admitting: Cardiovascular Disease

## 2022-03-14 VITALS — BP 130/70 | HR 70 | Ht 66.0 in | Wt 180.1 lb

## 2022-03-14 DIAGNOSIS — I480 Paroxysmal atrial fibrillation: Secondary | ICD-10-CM | POA: Diagnosis not present

## 2022-03-14 DIAGNOSIS — I6523 Occlusion and stenosis of bilateral carotid arteries: Secondary | ICD-10-CM

## 2022-03-14 DIAGNOSIS — I10 Essential (primary) hypertension: Secondary | ICD-10-CM | POA: Diagnosis not present

## 2022-03-14 DIAGNOSIS — I2510 Atherosclerotic heart disease of native coronary artery without angina pectoris: Secondary | ICD-10-CM | POA: Diagnosis not present

## 2022-03-14 DIAGNOSIS — E785 Hyperlipidemia, unspecified: Secondary | ICD-10-CM
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
## 2022-03-14 NOTE — Patient Instructions (Signed)
Medication Instructions:  Your physician recommends that you continue on your current medications as directed. Please refer to the Current Medication list given to you today.  *If you need a refill on your cardiac medications before your next appointment, please call your pharmacy*   Lab Work: None ordered If you have labs (blood work) drawn today and your tests are completely normal, you will receive your results only by: [place] (if you have MyChart) OR A paper copy in the mail If you have any lab test that is abnormal or we need to change your treatment, we will call you to review the results.   Testing/Procedures: Your physician has requested that you have a carotid duplex. This test is an ultrasound of the carotid arteries in your neck. It looks at blood flow through these arteries that supply the brain with blood. Allow one hour for this exam. There are no restrictions or special instructions.    Follow-Up: At [facility], you and your health needs are our priority.  As part of our continuing mission to provide you with exceptional heart care, we have created designated Provider Care Teams.  These Care Teams include your primary Cardiologist (physician) and Advanced Practice Providers (APPs -  Physician Assistants and Nurse Practitioners) who all work together to provide you with the care you need, when you need it.  We recommend signing up for the patient portal called "MyChart".  Sign up information is provided on this After Visit Summary.  MyChart is used to connect with patients for Virtual Visits (Telemedicine).  Patients are able to view lab/test results, encounter notes, upcoming appointments, etc.  Non-urgent messages can be sent to your provider as well.   To learn more about what you can do with MyChart, go to [email or web address].    Your next appointment:   6 month(s)  The format for your next appointment:   In Person  Provider:   You may see  Kathlyn Sacramento, MD or one of the following Advanced Practice Providers on your designated Care Team:   Murray Hodgkins, NP Christell Faith, PA-C Cadence Kathlen Mody, PA-C Gerrie Nordmann, NP   Other Instructions N/A  Important Information About Sugar

## 2022-03-17 ENCOUNTER — Encounter: Payer: Self-pay | Admitting: Family

## 2022-03-17 ENCOUNTER — Ambulatory Visit (INDEPENDENT_AMBULATORY_CARE_PROVIDER_SITE_OTHER): Payer: Medicare Other | Admitting: Family

## 2022-03-17 VITALS — BP 146/84 | HR 59 | Temp 98.0°F | Ht 66.0 in | Wt 181.4 lb

## 2022-03-17 DIAGNOSIS — I10 Essential (primary) hypertension: Secondary | ICD-10-CM | POA: Diagnosis not present

## 2022-03-17 DIAGNOSIS — I6523 Occlusion and stenosis of bilateral carotid arteries: Secondary | ICD-10-CM

## 2022-03-17 DIAGNOSIS — M25551 Pain in right hip: Secondary | ICD-10-CM

## 2022-03-17 DIAGNOSIS — Z0000 Encounter for general adult medical examination without abnormal findings: Secondary | ICD-10-CM
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
## 2022-03-17 NOTE — Patient Instructions (Signed)
We will look at xrays to start  You may continue tylenol arthritis  It is imperative that you are seen AT least twice per year for labs and monitoring. Monitor blood pressure at home and me 5-6 reading on separate days. Goal is less than 120/80, based on newest guidelines, however we certainly want to be less than 130/80;  if persistently higher, please make sooner follow up appointment so we can recheck you blood pressure and manage/ adjust medications.

## 2022-03-18 ENCOUNTER — Other Ambulatory Visit: Payer: Medicare Other

## 2022-03-18 ENCOUNTER — Ambulatory Visit (INDEPENDENT_AMBULATORY_CARE_PROVIDER_SITE_OTHER): Payer: Medicare Other

## 2022-03-18 ENCOUNTER — Ambulatory Visit: Payer: Medicare Other

## 2022-03-18 ENCOUNTER — Other Ambulatory Visit: Payer: Self-pay | Admitting: Cardiovascular Disease

## 2022-03-18 DIAGNOSIS — M25551 Pain in right hip: Secondary | ICD-10-CM

## 2022-03-18 DIAGNOSIS — M1611 Unilateral primary osteoarthritis, right hip: Secondary | ICD-10-CM | POA: Diagnosis not present

## 2022-03-18 NOTE — Assessment & Plan Note (Signed)
Slightly elevated in the office today.  Blood pressure readings at home average 118/80.  Patient is very compliant with checking blood pressure at home.  We agreed to continue with diltiazem 240 mg daily.  He will certainly call if blood pressure escalates

## 2022-03-18 NOTE — Assessment & Plan Note (Signed)
Symptoms consistent with sacroiliac joint dysfunction, and/or trochanteric bursitis.  Pending baseline hip imaging at this time.  Advised continue use of Tylenol arthritis. Consider PT referral.

## 2022-03-19 ENCOUNTER — Telehealth: Payer: Self-pay | Admitting: Family

## 2022-03-19 DIAGNOSIS — M25551 Pain in right hip: Secondary | ICD-10-CM

## 2022-03-19 NOTE — Telephone Encounter (Signed)
Patient called and is responding to a MyChart message. Yes, he would like a referral for a MRI for rt hip/pelvis.Would also like MRI on left hip, having some pain.

## 2022-03-24 NOTE — Telephone Encounter (Signed)
Ordered MRI studies , lumbar and bilateral hips  Let me know if any issues scheduling

## 2022-03-24 NOTE — Telephone Encounter (Signed)
noted 

## 2022-03-24 NOTE — Addendum Note (Signed)
Addended by: Burnard Hawthorne on: 03/24/2022 05:56 AM   Modules accepted: Orders

## 2022-03-28 NOTE — Progress Notes (Signed)
Error in opening.  Disregard.

## 2022-03-31 ENCOUNTER — Ambulatory Visit
Admission: RE | Admit: 2022-03-31 | Discharge: 2022-03-31 | Disposition: A | Discharge status: Home / Self Care | Payer: Medicare Other | Source: Ambulatory Visit | Attending: Family | Admitting: Family

## 2022-03-31 DIAGNOSIS — M25551 Pain in right hip: Secondary | ICD-10-CM

## 2022-03-31 DIAGNOSIS — M545 Low back pain, unspecified: Secondary | ICD-10-CM | POA: Diagnosis not present

## 2022-03-31 DIAGNOSIS — M25552 Pain in left hip: Secondary | ICD-10-CM | POA: Diagnosis not present

## 2022-04-17 ENCOUNTER — Ambulatory Visit: Payer: Medicare Other | Attending: Cardiovascular Disease

## 2022-04-17 DIAGNOSIS — I6523 Occlusion and stenosis of bilateral carotid arteries: Secondary | ICD-10-CM | POA: Diagnosis not present

## 2022-04-28 ENCOUNTER — Ambulatory Visit: Payer: Medicare Other

## 2022-05-01 ENCOUNTER — Encounter: Payer: Self-pay | Admitting: *Deleted

## 2022-05-09 ENCOUNTER — Ambulatory Visit (INDEPENDENT_AMBULATORY_CARE_PROVIDER_SITE_OTHER): Payer: Medicare Other

## 2022-05-09 VITALS — Ht 66.0 in | Wt 181.0 lb

## 2022-05-09 DIAGNOSIS — Z0000 Encounter for general adult medical examination without abnormal findings: Secondary | ICD-10-CM | POA: Diagnosis not present
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
## 2022-05-09 NOTE — Patient Instructions (Addendum)
Dalton Sanders , Thank you for taking time to come for your Medicare Wellness Visit. I appreciate your ongoing commitment to your health goals. Please review the following plan we discussed and let me know if I can assist you in the future.   These are the goals we discussed:  Goals      Follow up with Primary Care Provider     As needed/scheduled        This is a list of the screening recommended for you and due dates:  Health Maintenance  Topic Date Due   Pneumonia Vaccine (1 - PCV) 08/27/2022*   Zoster (Shingles) Vaccine (1 of 2) 12/11/2022*   Medicare Annual Wellness Visit  05/10/2023   Colon Cancer Screening  01/29/2025   Hepatitis C Screening: USPSTF Recommendation to screen - Ages 18-79 yo.  Completed   HPV Vaccine  Aged Out   Flu Shot  Discontinued   COVID-19 Vaccine  Discontinued  *Topic was postponed. The date shown is not the original due date.    Conditions/risks identified: none new.  Next appointment: Follow up in one year for your annual wellness visit.   Preventive Care 80 Years and Older, Male  Preventive care refers to lifestyle choices and visits with your health care provider that can promote health and wellness. What does preventive care include? A yearly physical exam. This is also called an annual well check. Dental exams once or twice a year. Routine eye exams. Ask your health care provider how often you should have your eyes checked. Personal lifestyle choices, including: Daily care of your teeth and gums. Regular physical activity. Eating a healthy diet. Avoiding tobacco and drug use. Limiting alcohol use. Practicing safe sex. Taking low doses of aspirin every day. Taking vitamin and mineral supplements as recommended by your health care provider. What happens during an annual well check? The services and screenings done by your health care provider during your annual well check will depend on your age, overall health, lifestyle risk factors,  and family history of disease. Counseling  Your health care provider may ask you questions about your: Alcohol use. Tobacco use. Drug use. Emotional well-being. Home and relationship well-being. Sexual activity. Eating habits. History of falls. Memory and ability to understand (cognition). Work and work Statistician. Screening  You may have the following tests or measurements: Height, weight, and BMI. Blood pressure. Lipid and cholesterol levels. These may be checked every 5 years, or more frequently if you are over 70 years old. Skin check. Lung cancer screening. You may have this screening every year starting at age 70 if you have a 30-pack-year history of smoking and currently smoke or have quit within the past 15 years. Fecal occult blood test (FOBT) of the stool. You may have this test every year starting at age 70. Flexible sigmoidoscopy or colonoscopy. You may have a sigmoidoscopy every 5 years or a colonoscopy every 10 years starting at age 70. Prostate cancer screening. Recommendations will vary depending on your family history and other risks. Hepatitis C blood test. Hepatitis B blood test. Sexually transmitted disease (STD) testing. Diabetes screening. This is done by checking your blood sugar (glucose) after you have not eaten for a while (fasting). You may have this done every 1-3 years. Abdominal aortic aneurysm (AAA) screening. You may need this if you are a current or former smoker. Osteoporosis. You may be screened starting at age 70 if you are at high risk. Talk with your health care provider about your  test results, treatment options, and if necessary, the need for more tests. Vaccines  Your health care provider may recommend certain vaccines, such as: Influenza vaccine. This is recommended every year. Tetanus, diphtheria, and acellular pertussis (Tdap, Td) vaccine. You may need a Td booster every 10 years. Zoster vaccine. You may need this after age  70. Pneumococcal 13-valent conjugate (PCV13) vaccine. One dose is recommended after age 70. Pneumococcal polysaccharide (PPSV23) vaccine. One dose is recommended after age 70. Talk to your health care provider about which screenings and vaccines you need and how often you need them. This information is not intended to replace advice given to you by your health care provider. Make sure you discuss any questions you have with your health care provider. Document Released: 07/06/2015 Document Revised: 02/27/2016 Document Reviewed: 04/10/2015 Elsevier Interactive Patient Education  2017 [place] Prevention in the Home Falls can cause injuries. They can happen to people of all ages. There are many things you can do to make your home safe and to help prevent falls. What can I do on the outside of my home? Regularly fix the edges of walkways and driveways and fix any cracks. Remove anything that might make you trip as you walk through a door, such as a raised step or threshold. Trim any bushes or trees on the path to your home. Use bright outdoor lighting. Clear any walking paths of anything that might make someone trip, such as rocks or tools. Regularly check to see if handrails are loose or broken. Make sure that both sides of any steps have handrails. Any raised decks and porches should have guardrails on the edges. Have any leaves, snow, or ice cleared regularly. Use sand or salt on walking paths during winter. Clean up any spills in your garage right away. This includes oil or grease spills. What can I do in the bathroom? Use night lights. Install grab bars by the toilet and in the tub and shower. Do not use towel bars as grab bars. Use non-skid mats or decals in the tub or shower. If you need to sit down in the shower, use a plastic, non-slip stool. Keep the floor dry. Clean up any water that spills on the floor as soon as it happens. Remove soap buildup in the tub or shower  regularly. Attach bath mats securely with double-sided non-slip rug tape. Do not have throw rugs and other things on the floor that can make you trip. What can I do in the bedroom? Use night lights. Make sure that you have a light by your bed that is easy to reach. Do not use any sheets or blankets that are too big for your bed. They should not hang down onto the floor. Have a firm chair that has side arms. You can use this for support while you get dressed. Do not have throw rugs and other things on the floor that can make you trip. What can I do in the kitchen? Clean up any spills right away. Avoid walking on wet floors. Keep items that you use a lot in easy-to-reach places. If you need to reach something above you, use a strong step stool that has a grab bar. Keep electrical cords out of the way. Do not use floor polish or wax that makes floors slippery. If you must use wax, use non-skid floor wax. Do not have throw rugs and other things on the floor that can make you trip. What can I do with my  stairs? Do not leave any items on the stairs. Make sure that there are handrails on both sides of the stairs and use them. Fix handrails that are broken or loose. Make sure that handrails are as long as the stairways. Check any carpeting to make sure that it is firmly attached to the stairs. Fix any carpet that is loose or worn. Avoid having throw rugs at the top or bottom of the stairs. If you do have throw rugs, attach them to the floor with carpet tape. Make sure that you have a light switch at the top of the stairs and the bottom of the stairs. If you do not have them, ask someone to add them for you. What else can I do to help prevent falls? Wear shoes that: Do not have high heels. Have rubber bottoms. Are comfortable and fit you well. Are closed at the toe. Do not wear sandals. If you use a stepladder: Make sure that it is fully opened. Do not climb a closed stepladder. Make sure that  both sides of the stepladder are locked into place. Ask someone to hold it for you, if possible. Clearly mark and make sure that you can see: Any grab bars or handrails. First and last steps. Where the edge of each step is. Use tools that help you move around (mobility aids) if they are needed. These include: Canes. Walkers. Scooters. Crutches. Turn on the lights when you go into a dark area. Replace any light bulbs as soon as they burn out. Set up your furniture so you have a clear path. Avoid moving your furniture around. If any of your floors are uneven, fix them. If there are any pets around you, be aware of where they are. Review your medicines with your doctor. Some medicines can make you feel dizzy. This can increase your chance of falling. Ask your doctor what other things that you can do to help prevent falls. This information is not intended to replace advice given to you by your health care provider. Make sure you discuss any questions you have with your health care provider. Document Released: 04/05/2009 Document Revised: 11/15/2015 Document Reviewed: 07/14/2014 Elsevier Interactive Patient Education  2017 [employer].

## 2022-06-10 ENCOUNTER — Ambulatory Visit (INDEPENDENT_AMBULATORY_CARE_PROVIDER_SITE_OTHER): Payer: Medicare Other | Admitting: Family

## 2022-06-10 ENCOUNTER — Encounter: Payer: Self-pay | Admitting: Family

## 2022-06-10 VITALS — BP 142/82 | HR 87 | Temp 97.7°F | Ht 66.0 in | Wt 182.2 lb

## 2022-06-10 DIAGNOSIS — I6523 Occlusion and stenosis of bilateral carotid arteries: Secondary | ICD-10-CM

## 2022-06-10 DIAGNOSIS — I10 Essential (primary) hypertension: Secondary | ICD-10-CM | POA: Diagnosis not present

## 2022-06-10 DIAGNOSIS — M25551 Pain in right hip: Secondary | ICD-10-CM

## 2022-06-10 MED ORDER — GABAPENTIN 100 MG PO CAPS: 100.0000 mg | ORAL_CAPSULE | Freq: Three times a day (TID) | ORAL | 3 refills | Status: DC

## 2022-06-10 NOTE — Assessment & Plan Note (Addendum)
Reviewed MRI bilateral hips and lumbar spine again today.  Expressed my concern in regards to lumbar moderate stenosis which is worsened since previous MRI.  Severe narrowing L3-L4.  We agreed consult with Dr. Marry Guan in regards to right hip and right knee pain.  Advised consult with Dr. Cari Caraway neurosurgery in regards spinal stenosis.  Counseled on the importance of avoiding NSAIDs entirely.  May continue tylenol. He declines tramadol.  Trial gabapentin 100 mg nightly and advance as tolerated to gabapentin 100 mg 3 times daily.  Counseled on side effects of sedation

## 2022-06-10 NOTE — Assessment & Plan Note (Signed)
Again, patient I discussed extensively blood pressure readings at home which are under excellent control.  Discussed today that he is uncomfortable in regards to his hip and low back at this time and question if pain has aggrevated.  He will continue monitoring blood pressure at home. Continue Cardizem 240 mg daily

## 2022-06-10 NOTE — Patient Instructions (Addendum)
Start gabapentin by taking '100mg'$  at night and then slowly increase to 3 times daily.   You call the MRI office  [phone] and request the MRI of your hip and lumbar spine to be burned to a CD so that may take this with you to orthopedic appointments.    Referral to Dr Marry Guan, Dr Cari Caraway.   Let us know if you dont hear back within a week in regards to an appointment being scheduled.   So that you are aware, if you are [facility] MyChart user , please pay attention to your MyChart messages as you may receive a MyChart message with a phone number to call and schedule this test/appointment own your own from our referral coordinator. This is a new process so I do not want you to miss this message.  If you are not a MyChart user, you will receive a phone call.

## 2022-06-10 NOTE — Progress Notes (Signed)
Assessment & Plan:  Right hip pain Assessment & Plan: Reviewed MRI bilateral hips and lumbar spine again today.  Expressed my concern in regards to lumbar moderate stenosis which is worsened since previous MRI.  Severe narrowing L3-L4.  We agreed consult with Dr. Marry Guan in regards to right hip and right knee pain.  Advised consult with Dr. Cari Caraway neurosurgery in regards spinal stenosis.  Counseled on the importance of avoiding NSAIDs entirely.  May continue tylenol. He declines tramadol.  Trial gabapentin 100 mg nightly and advance as tolerated to gabapentin 100 mg 3 times daily.  Counseled on side effects of sedation   Orders: -     Ambulatory referral to Orthopedic Surgery -     Ambulatory referral to Neurosurgery -     Gabapentin; Take 1 capsule (100 mg total) by mouth 3 (three) times daily.  Dispense: 90 capsule; Refill: 3  Primary hypertension Assessment & Plan: Again, patient I discussed extensively blood pressure readings at home which are under excellent control.  Discussed today that he is uncomfortable in regards to his hip and low back at this time and question if pain has aggrevated.  He will continue monitoring blood pressure at home. Continue Cardizem 240 mg daily      Return precautions given.   Risks, benefits, and alternatives of the medications and treatment plan prescribed today were discussed, and patient expressed understanding.   Education regarding symptom management and diagnosis given to patient on AVS either electronically or printed.  Return in about 3 months (around 09/09/2022).  Mable Paris, FNP  Subjective:    Patient ID: Dalton Sanders, male    DOB: 1951/01/02, 70 y.o.   MRN: [id number]  CC: Dalton Sanders is a 71 y.o. male who presents today for an acute visit.    HPI: He complains of continued right hip pain.   He is taking 3 tablets of '625mg'$  tylenol. He will take occasional aleve.  He is taking glucosamine, tumeric.   He had  one day one week ago in which he had right leg had posterior numbness, since resolved.   No urinary incontinence, fecal incontinence, saddle anesthesia                BP at home 118/65, 125/70.  He thinks pain today may contribute to elevated blood pressure.    Follows with Dr Diamantina Providence  MRI right and left hip obtained 03/31/2022.  Moderate right hip osteoarthritis arthritis, mild left trochanteric bursitis MRI lumbar spine 03/31/22 shows moderate stenosis L2-L3 which has worsened since previous MRI.  Severe narrowing L3-L4 which is also progressed.  A small disc protrusion  L5 to S1.   Allergies: Corticosteroids, Iodides, Other, Oysters [shellfish allergy], and Cortisone Current Outpatient Medications on File Prior to Visit  Medication Sig Dispense Refill   albuterol (VENTOLIN HFA) 108 (90 Base) MCG/ACT inhaler TAKE 2 PUFFS BY MOUTH EVERY 6 HOURS AS NEEDED FOR WHEEZE OR SHORTNESS OF BREATH 8.5 each 2   atorvastatin (LIPITOR) 80 MG tablet TAKE 1 TABLET BY MOUTH EVERY DAY 90 tablet 1   Cholecalciferol (VITAMIN D3) 125 MCG (5000 UT) CAPS Take 1 capsule by mouth daily.     Coenzyme Q10 (CO Q 10 PO) Take 1 tablet by mouth daily.     diltiazem (CARDIZEM CD) 240 MG 24 hr capsule TAKE 1 CAPSULE BY MOUTH EVERY DAY (MD NOTED TO FILL FOR 4 DAY SUPPLY FIRST) 90 capsule 3   diltiazem (CARDIZEM) 30 MG tablet TAKE 1 TABLET (  30 MG TOTAL) BY MOUTH AS NEEDED. 90 tablet 3   flecainide (TAMBOCOR) 50 MG tablet TAKE 1 TABLET BY MOUTH TWICE A DAY 180 tablet 2   levalbuterol (XOPENEX HFA) 45 MCG/ACT inhaler Inhale 2 puffs into the lungs 3 (three) times daily. 1 each 12   MAGNESIUM PO Take 1 capsule by mouth daily.     Multiple Vitamin (ONE-A-DAY MENS PO) Take 1 tablet by mouth daily.      omeprazole (PRILOSEC) 20 MG capsule TAKE 1 CAPSULE BY MOUTH EVERY DAY 90 capsule 2   potassium chloride SA (KLOR-CON M) 20 MEQ tablet Take 20 mEq by mouth See admin instructions.     Saw Palmetto, Serenoa repens, (SAW  PALMETTO PO) Take 40 mg by mouth daily.      triamcinolone (KENALOG) 0.1 % Apply 1 application topically 2 (two) times daily as needed.     VITAMIN E PO Take 1 capsule by mouth.     XARELTO 20 MG TABS tablet TAKE 1 TABLET BY MOUTH DAILY WITH SUPPER 90 tablet 1   No current facility-administered medications on file prior to visit.    Review of Systems  Constitutional:  Negative for chills and fever.  Respiratory:  Negative for cough.   Cardiovascular:  Negative for chest pain and palpitations.  Gastrointestinal:  Negative for nausea and vomiting.  Musculoskeletal:  Positive for arthralgias and back pain.  Neurological:  Positive for numbness.      Objective:    BP (!) 142/82   Pulse 87   Temp 97.7 F (36.5 C) (Oral)   Ht '5\' 6"'$  (1.676 m)   Wt 182 lb 3.2 oz (82.6 kg)   SpO2 98%   BMI 29.41 kg/m   BP Readings from Last 3 Encounters:  06/10/22 (!) 142/82  03/17/22 (!) 146/84  03/14/22 130/70   Wt Readings from Last 3 Encounters:  06/10/22 182 lb 3.2 oz (82.6 kg)  05/09/22 181 lb (82.1 kg)  03/17/22 181 lb 6.4 oz (82.3 kg)    Physical Exam Vitals reviewed.  Constitutional:      Appearance: He is well-developed.  Cardiovascular:     Rate and Rhythm: Regular rhythm.     Heart sounds: Normal heart sounds.  Pulmonary:     Effort: Pulmonary effort is normal. No respiratory distress.     Breath sounds: Normal breath sounds. No wheezing, rhonchi or rales.  Skin:    General: Skin is warm and dry.  Neurological:     Mental Status: He is alert.  Psychiatric:        Speech: Speech normal.        Behavior: Behavior normal.

## 2022-06-16 ENCOUNTER — Other Ambulatory Visit: Payer: Self-pay | Admitting: Cardiovascular Disease

## 2022-06-16 DIAGNOSIS — I480 Paroxysmal atrial fibrillation: Secondary | ICD-10-CM

## 2022-06-17 NOTE — Telephone Encounter (Signed)
Prescription refill request for Xarelto received.  Indication:afib Last office visit:9/23 Weight:82.6 kg Age:71 Scr:1.3 CrCl:61.77  ml/min  Prescription refilled

## 2022-06-17 NOTE — Telephone Encounter (Signed)
Refill request

## 2022-06-18 DIAGNOSIS — M1611 Unilateral primary osteoarthritis, right hip: Secondary | ICD-10-CM | POA: Diagnosis not present

## 2022-06-20 ENCOUNTER — Telehealth: Payer: Self-pay | Admitting: Cardiovascular Disease

## 2022-06-20 NOTE — Telephone Encounter (Signed)
The patient stated that he has had steroid injections in the past, twice before, and went into afib. His provider would like for him to have one now, betamethasone,  for his hip pain and potential future hip surgery. He would like to know if Dr. Fletcher Anon feels like this is okay.  He currently takes: Flecainide 50 ng bid Diltiazem 240 mg once daily

## 2022-06-20 NOTE — Telephone Encounter (Signed)
The patient has been made aware that is should be okay. He will keep his prn diltiazem on hand as well.

## 2022-06-20 NOTE — Telephone Encounter (Signed)
Pt c/o medication issue:  1. Name of Medication: betamethasone   2. How are you currently taking this medication (dosage and times per day)? Not currently taking  3. Are you having a reaction (difficulty breathing--STAT)? no  4. What is your medication issue? Patient calling to see if he could have a steroid injection for hip pain. He says he had taken a steroid injection in the past and the next day he went into afib. He says he also had taken a steroid tablets from his PCP and also went into afib. He says he is not sure what the medication was. He says he had spoke to Dr. Fletcher Anon about having an injection for hip pain and Dr. Fletcher Anon mentioned he may be able to take it now that he is taking afib medication.

## 2022-06-20 NOTE — Telephone Encounter (Signed)
Yes, that should be fine.  He is on good medications for A-fib and hopefully that will prevent another episode.
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
## 2022-06-22 ENCOUNTER — Other Ambulatory Visit: Payer: Self-pay | Admitting: Family

## 2022-06-22 DIAGNOSIS — E785 Hyperlipidemia, unspecified: Secondary | ICD-10-CM

## 2022-06-24 ENCOUNTER — Ambulatory Visit (INDEPENDENT_AMBULATORY_CARE_PROVIDER_SITE_OTHER): Payer: Medicare Other | Admitting: Neurosurgery

## 2022-06-24 ENCOUNTER — Encounter: Payer: Self-pay | Admitting: Neurosurgery

## 2022-06-24 VITALS — BP 157/81 | HR 66 | Ht 66.0 in | Wt 184.6 lb

## 2022-06-24 DIAGNOSIS — M48062 Spinal stenosis, lumbar region with neurogenic claudication: Secondary | ICD-10-CM | POA: Diagnosis not present

## 2022-06-24 DIAGNOSIS — M25551 Pain in right hip: Secondary | ICD-10-CM | POA: Diagnosis not present

## 2022-07-15 DIAGNOSIS — M1611 Unilateral primary osteoarthritis, right hip: Secondary | ICD-10-CM | POA: Diagnosis not present

## 2022-07-15 DIAGNOSIS — M6281 Muscle weakness (generalized): Secondary | ICD-10-CM | POA: Diagnosis not present

## 2022-07-15 DIAGNOSIS — M25651 Stiffness of right hip, not elsewhere classified: Secondary | ICD-10-CM | POA: Diagnosis not present

## 2022-07-30 ENCOUNTER — Emergency Department
Admission: EM | Admit: 2022-07-30 | Discharge: 2022-07-30 | Disposition: A | Discharge status: Home / Self Care | Payer: Medicare Other | Attending: Emergency Medicine | Admitting: Emergency Medicine

## 2022-07-30 ENCOUNTER — Other Ambulatory Visit: Payer: Self-pay

## 2022-07-30 ENCOUNTER — Encounter: Payer: Self-pay | Admitting: Emergency Medicine

## 2022-07-30 DIAGNOSIS — R519 Headache, unspecified: Secondary | ICD-10-CM | POA: Diagnosis present

## 2022-07-30 DIAGNOSIS — R030 Elevated blood-pressure reading, without diagnosis of hypertension: Secondary | ICD-10-CM

## 2022-07-30 DIAGNOSIS — J45909 Unspecified asthma, uncomplicated: Secondary | ICD-10-CM | POA: Insufficient documentation

## 2022-07-30 DIAGNOSIS — Z7901 Long term (current) use of anticoagulants: Secondary | ICD-10-CM | POA: Insufficient documentation

## 2022-07-30 DIAGNOSIS — I10 Essential (primary) hypertension: Secondary | ICD-10-CM | POA: Diagnosis not present

## 2022-07-30 DIAGNOSIS — I2510 Atherosclerotic heart disease of native coronary artery without angina pectoris: Secondary | ICD-10-CM | POA: Insufficient documentation

## 2022-07-30 LAB — CBC WITH DIFFERENTIAL/PLATELET
Abs Immature Granulocytes: 0.01 10*3/uL (ref 0.00–0.07)
Basophils Absolute: 0.1 10*3/uL (ref 0.0–0.1)
Basophils Relative: 1 %
Eosinophils Absolute: 0.4 10*3/uL (ref 0.0–0.5)
Eosinophils Relative: 5 %
HCT: 35 % — ABNORMAL LOW (ref 39.0–52.0)
Hemoglobin: 11 g/dL — ABNORMAL LOW (ref 13.0–17.0)
Immature Granulocytes: 0 %
Lymphocytes Relative: 42 %
Lymphs Abs: 2.9 10*3/uL (ref 0.7–4.0)
MCH: 27.2 pg (ref 26.0–34.0)
MCHC: 31.4 g/dL (ref 30.0–36.0)
MCV: 86.4 fL (ref 80.0–100.0)
Monocytes Absolute: 0.8 10*3/uL (ref 0.1–1.0)
Monocytes Relative: 12 %
Neutro Abs: 2.7 10*3/uL (ref 1.7–7.7)
Neutrophils Relative %: 40 %
Platelets: 358 10*3/uL (ref 150–400)
RBC: 4.05 MIL/uL — ABNORMAL LOW (ref 4.22–5.81)
RDW: 16.9 % — ABNORMAL HIGH (ref 11.5–15.5)
WBC: 6.8 10*3/uL (ref 4.0–10.5)
nRBC: 0 % (ref 0.0–0.2)

## 2022-07-30 LAB — BASIC METABOLIC PANEL
Anion gap: 8 (ref 5–15)
BUN: 22 mg/dL (ref 8–23)
CO2: 25 mmol/L (ref 22–32)
Calcium: 9.3 mg/dL (ref 8.9–10.3)
Chloride: 105 mmol/L (ref 98–111)
Creatinine, Ser: 1.34 mg/dL — ABNORMAL HIGH (ref 0.61–1.24)
GFR, Estimated: 57 mL/min — ABNORMAL LOW (ref >60)
Glucose, Bld: 128 mg/dL — ABNORMAL HIGH (ref 70–99)
Potassium: 4.8 mmol/L (ref 3.5–5.1)
Sodium: 138 mmol/L (ref 135–145)

## 2022-07-30 NOTE — ED Triage Notes (Signed)
Patient to ED via [facility] from home for hypertension- check regularly at home (214/107 at home). Denies headache or other symptoms. Hx of afib. 190/100 with EMS. Has been taking over the counter flu medicine.

## 2022-07-30 NOTE — Discharge Instructions (Signed)
Please stop taking the TheraFlu.  You can take Tylenol for cold or flu symptoms.  Continue to check your blood pressure daily.  If it is consistently above 140 then you should follow-up with either your cardiologist or primary doctor to see if you need to be started on medications.

## 2022-07-30 NOTE — ED Provider Notes (Signed)
[facility] Provider Note    Event Date/Time   First MD Initiated Contact with Patient 07/30/22 2004     (approximate)   History   Hypertension (/)   HPI  Dalton Sanders is a 72 y.o. male past medical history of atrial fibrillation on anticoagulation and flecainide who presents because of elevated blood pressure.  Patient tells me he has had some cold symptoms for the last 3 days including runny nose headache and bodyaches.  He has been taking TheraFlu over the last several days.  He typically checks his blood pressure regularly 3 times a day.  Typically runs in the 466Z systolic today he checked it and it was over 200 which concerned him.  His friend recently had a stroke so he is worried about that happening.  Patient had a headache over the last 2 days but this is resolved denies vision change numbness tingling weakness.  Denies chest pain dyspnea lower extremity swelling.  He feels improved from a viral standpoint today.  He has never been diagnosed with high blood pressure does not take any blood pressure medications.     Past Medical History:  Diagnosis Date   Arthritis    knees   BMI 28.0-28.9,adult    Carotid arterial disease ([place])    a. 11/2016 U/S: mild bilat dzs; b. 02/2019 Carotid U/S: 1-39% bilat ICA stenosis.   Chest pain    a. 03/2009 MV: EF 70%, no isch/infarct (Dr. Nehemiah Massed);  b. 05/2013 ETT: Ex time 6:07, no st/t changes, HTN response to exericse->med rx; c. 03/2016 CT chest: coronary Ca2+ and atherosclerosis noted; d. 04/2016 Neg MV; e. 02/2017 Cath: LM nl, LAD min irregs, D1/2/3 nl, LCX min irregs, OM1/2/3 nl, RCA min irregs, RPDA/RPAV/RPL1/2 nl, EF 55-65%.   Coronary artery disease    Eosinophilia    GERD (gastroesophageal reflux disease)    Hyperlipidemia    Hypertension    PAF (paroxysmal atrial fibrillation) (HCC)    a. CHA2DS2VASc = 3-->Xarelto.   Precancerous skin lesion    on left hand and right hand   Squamous cell carcinoma     Syncope and collapse     Patient Active Problem List   Diagnosis Date Noted   ERRONEOUS ENCOUNTER--DISREGARD 03/31/2022   Right hip pain 03/17/2022   Urinary frequency 08/26/2021   BMI 28.0-28.9,adult    PAF (paroxysmal atrial fibrillation) (HCC)    Shortness of breath on exertion 05/27/2020   Eosinophilia 05/15/2020   Right lower lobe pulmonary nodule 05/15/2020   Left hip pain 02/04/2019   Rectal bleeding 05/24/2018   Asthma 11/02/2017   Nocturia 11/02/2017   GERD (gastroesophageal reflux disease) 11/02/2017   Unstable angina (HCC)    Hypertension    Chest pain    Carotid stenosis 03/31/2016   Carotid stenosis 03/31/2016   Paroxysmal atrial fibrillation ([place]) 02/02/2014   Routine physical examination 02/01/2014   SVT (supraventricular tachycardia) 01/18/2014   Hyperlipidemia 04/14/2013   Elevated BP 04/14/2013     Physical Exam  Triage Vital Signs: ED Triage Vitals [07/30/22 1809]  Enc Vitals Group     BP (!) 190/93     Pulse Rate 83     Resp 18     Temp (!) 97.3 F (36.3 C)     Temp Source Oral     SpO2 99 %     Weight      Height      Head Circumference      Peak Flow  Pain Score 0     Pain Loc      Pain Edu?      Excl. in [place]?     Most recent vital signs: Vitals:   07/30/22 2005 07/30/22 2038  BP:  (!) 147/100  Pulse: 81   Resp:    Temp:    SpO2: 100%      General: Awake, no distress.  CV:  Good peripheral perfusion. No edema Resp:  Normal effort. Lungs clear Abd:  No distention.  Neuro:             Awake, Alert, Oriented x 3  Other:  Aox3, nml speech  PERRL, EOMI, face symmetric, nml tongue movement  5/5 strength in the BL upper and lower extremities  Sensation grossly intact in the BL upper and lower extremities  Finger-nose-finger intact BL    ED Results / Procedures / Treatments  Labs (all labs ordered are listed, but only abnormal results are displayed) Labs Reviewed  CBC WITH DIFFERENTIAL/PLATELET - Abnormal; Notable  for the following components:      Result Value   RBC 4.05 (*)    Hemoglobin 11.0 (*)    HCT 35.0 (*)    RDW 16.9 (*)    All other components within normal limits  BASIC METABOLIC PANEL - Abnormal; Notable for the following components:   Glucose, Bld 128 (*)    Creatinine, Ser 1.34 (*)    GFR, Estimated 57 (*)    All other components within normal limits     EKG  EKG interpretation performed by myself: NSR, nml axis, 1st degree AV block , no acute ischemic changes    RADIOLOGY    PROCEDURES:  Critical Care performed: No  Procedures  The patient is on the cardiac monitor to evaluate for evidence of arrhythmia and/or significant heart rate changes.   MEDICATIONS ORDERED IN ED: Medications - No data to display   IMPRESSION / MDM / [place] / ED COURSE  I reviewed the triage vital signs and the nursing notes.                              Patient's presentation is most consistent with acute, uncomplicated illness.  Differential diagnosis includes, but is not limited to, asymptomatic hypertension, medication side effect, less likely renal failure, hypertensive emergency  The patient is a 72 year old male with paroxysmal A-fib anticoagulated no history of hypertension who presents because of elevated blood pressure reading at home.  He has been taking TheraFlu over the last several days due to cold symptoms including runny nose and headache.  Checked his blood pressure today and was 324 systolic.  Typically runs in the 120s.  Patient is otherwise asymptomatic does not have headache vision change chest pain dyspnea back pain lower extremity swelling.  Initial BP was 190/93, on repeat is 147/100.  Patient looks remarkably well he has no pitting edema lungs are clear neurologic exam is nonfocal.  No longer has headache.  I suspect that his elevated blood pressures in the setting of using TheraFlu as this is phenylephrine in it.  Presentation is not consistent with  hypertensive emergency.  Recommended patient stop taking this medication and continue to check his blood pressure.  If consistently over 140 may need to reassess need for antihypertensives but would not start anything now.  Did check his renal function and CBC that is reassuring.  Renal function is at baseline.  Patient  is appropriate for discharge.       FINAL CLINICAL IMPRESSION(S) / ED DIAGNOSES   Final diagnoses:  Elevated blood pressure reading     Rx / DC Orders   ED Discharge Orders     None        Note:  This document was prepared using Dragon voice recognition software and may include unintentional dictation errors.   Rada Hay, MD 07/30/22 2059

## 2022-08-01 ENCOUNTER — Telehealth: Payer: Self-pay | Admitting: Family

## 2022-08-01 NOTE — Telephone Encounter (Signed)
Pt would like to be called by the provider. Pt stated he has some things going with him and he did not tell me

## 2022-08-01 NOTE — Telephone Encounter (Signed)
Pt called back in regard of previous message. As per pt he went to the ED 2 days ago because of he bp. I offered to book an appt, he declined. He prefer to hear from [place] or her assistant.

## 2022-08-04 ENCOUNTER — Ambulatory Visit (INDEPENDENT_AMBULATORY_CARE_PROVIDER_SITE_OTHER): Payer: Medicare Other | Admitting: Family

## 2022-08-04 ENCOUNTER — Encounter: Payer: Self-pay | Admitting: Family

## 2022-08-04 VITALS — BP 138/78 | HR 58 | Temp 97.5°F | Ht 67.0 in | Wt 178.4 lb

## 2022-08-04 DIAGNOSIS — E559 Vitamin D deficiency, unspecified: Secondary | ICD-10-CM

## 2022-08-04 DIAGNOSIS — I10 Essential (primary) hypertension: Secondary | ICD-10-CM

## 2022-08-04 DIAGNOSIS — D649 Anemia, unspecified: Secondary | ICD-10-CM | POA: Diagnosis not present

## 2022-08-04 LAB — VITAMIN D 25 HYDROXY (VIT D DEFICIENCY, FRACTURES): VITD: 64.12 ng/mL (ref 30.00–100.00)

## 2022-08-04 LAB — B12 AND FOLATE PANEL
Folate: >23.9 ng/mL (ref >5.9)
Vitamin B-12: 437 pg/mL (ref 211–911)

## 2022-08-04 MED ORDER — HYDROCHLOROTHIAZIDE 25 MG PO TABS: 12.5000 mg | ORAL_TABLET | Freq: Every day | ORAL | 3 refills | Status: DC | PRN

## 2022-08-04 NOTE — Patient Instructions (Signed)
Start hctz 12.69m for blood pressure greater than 140/80.   You may needing to take medication more days than not.   Managing Your Hypertension Hypertension, also called high blood pressure, is when the force of the blood pressing against the walls of the arteries is too strong. Arteries are blood vessels that carry blood from your heart throughout your body. Hypertension forces the heart to work harder to pump blood and may cause the arteries to become narrow or stiff. Understanding blood pressure readings A blood pressure reading includes a higher number over a lower number: The first, or top, number is called the systolic pressure. It is a measure of the pressure in your arteries as your heart beats. The second, or bottom number, is called the diastolic pressure. It is a measure of the pressure in your arteries as the heart relaxes. For most people, a normal blood pressure is below 120/80. Your personal target blood pressure may vary depending on your medical conditions, your age, and other factors. Blood pressure is classified into four stages. Based on your blood pressure reading, your health care provider may use the following stages to determine what type of treatment you need, if any. Systolic pressure and diastolic pressure are measured in a unit called millimeters of mercury (mmHg). Normal Systolic pressure: below [id number] Diastolic pressure: below 80. Elevated Systolic pressure: [id number] Diastolic pressure: below 80. Hypertension stage 1 Systolic pressure: 10000000 Diastolic pressure: [id number] Hypertension stage 2 Systolic pressure: [id number] above. Diastolic pressure: 90 or above. How can this condition affect me? Managing your hypertension is very important. Over time, hypertension can damage the arteries and decrease blood flow to parts of the body, including the brain, heart, and kidneys. Having untreated or uncontrolled hypertension can lead to: A heart attack. A stroke. A weakened  blood vessel (aneurysm). Heart failure. Kidney damage. Eye damage. Memory and concentration problems. Vascular dementia. What actions can I take to manage this condition? Hypertension can be managed by making lifestyle changes and possibly by taking medicines. Your health care provider will help you make a plan to bring your blood pressure within a normal range. You may be referred for counseling on a healthy diet and physical activity. Nutrition  Eat a diet that is high in fiber and potassium, and low in salt (sodium), added sugar, and fat. An example eating plan is called the DASH diet. DASH stands for Dietary Approaches to Stop Hypertension. To eat this way: Eat plenty of fresh fruits and vegetables. Try to fill one-half of your plate at each meal with fruits and vegetables. Eat whole grains, such as whole-wheat pasta, brown rice, or whole-grain bread. Fill about one-fourth of your plate with whole grains. Eat low-fat dairy products. Avoid fatty cuts of meat, processed or cured meats, and poultry with skin. Fill about one-fourth of your plate with lean proteins such as fish, chicken without skin, beans, eggs, and tofu. Avoid pre-made and processed foods. These tend to be higher in sodium, added sugar, and fat. Reduce your daily sodium intake. Many people with hypertension should eat less than 1,500 mg of sodium a day. Lifestyle  Work with your health care provider to maintain a healthy body weight or to lose weight. Ask what an ideal weight is for you. Get at least 30 minutes of exercise that causes your heart to beat faster (aerobic exercise) most days of the week. Activities may include walking, swimming, or biking. Include exercise to strengthen your muscles (resistance exercise), such as weight lifting, as  part of your weekly exercise routine. Try to do these types of exercises for 30 minutes at least 3 days a week. Do not use any products that contain nicotine or tobacco. These products  include cigarettes, chewing tobacco, and vaping devices, such as e-cigarettes. If you need help quitting, ask your health care provider. Control any long-term (chronic) conditions you have, such as high cholesterol or diabetes. Identify your sources of stress and find ways to manage stress. This may include meditation, deep breathing, or making time for fun activities. Alcohol use Do not drink alcohol if: Your health care provider tells you not to drink. You are pregnant, may be pregnant, or are planning to become pregnant. If you drink alcohol: Limit how much you have to: 0-1 drink a day for women. 0-2 drinks a day for men. Know how much alcohol is in your drink. In the [place] one drink equals one 12 oz bottle of beer (355 mL), one 5 oz glass of wine (148 mL), or one 1 oz glass of hard liquor (44 mL). Medicines Your health care provider may prescribe medicine if lifestyle changes are not enough to get your blood pressure under control and if: Your systolic blood pressure is 130 or higher. Your diastolic blood pressure is 80 or higher. Take medicines only as told by your health care provider. Follow the directions carefully. Blood pressure medicines must be taken as told by your health care provider. The medicine does not work as well when you skip doses. Skipping doses also puts you at risk for problems. Monitoring Before you monitor your blood pressure: Do not smoke, drink caffeinated beverages, or exercise within 30 minutes before taking a measurement. Use the bathroom and empty your bladder (urinate). Sit quietly for at least 5 minutes before taking measurements. Monitor your blood pressure at home as told by your health care provider. To do this: Sit with your back straight and supported. Place your feet flat on the floor. Do not cross your legs. Support your arm on a flat surface, such as a table. Make sure your upper arm is at heart level. Each time you measure, take two or three  readings one minute apart and record the results. You may also need to have your blood pressure checked regularly by your health care provider. General information Talk with your health care provider about your diet, exercise habits, and other lifestyle factors that may be contributing to hypertension. Review all the medicines you take with your health care provider because there may be side effects or interactions. Keep all follow-up visits. Your health care provider can help you create and adjust your plan for managing your high blood pressure. Where to find more information National Heart, Lung, and Blood Institute: https://wilson-eaton.com/ American Heart Association: www.heart.org Contact a health care provider if: You think you are having a reaction to medicines you have taken. You have repeated (recurrent) headaches. You feel dizzy. You have swelling in your ankles. You have trouble with your vision. Get help right away if: You develop a severe headache or confusion. You have unusual weakness or numbness, or you feel faint. You have severe pain in your chest or abdomen. You vomit repeatedly. You have trouble breathing. These symptoms may be an emergency. Get help right away. Call 911. Do not wait to see if the symptoms will go away. Do not drive yourself to the hospital. Summary Hypertension is when the force of blood pumping through your arteries is too strong. If this condition is  not controlled, it may put you at risk for serious complications. Your personal target blood pressure may vary depending on your medical conditions, your age, and other factors. For most people, a normal blood pressure is less than 120/80. Hypertension is managed by lifestyle changes, medicines, or both. Lifestyle changes to help manage hypertension include losing weight, eating a healthy, low-sodium diet, exercising more, stopping smoking, and limiting alcohol. This information is not intended to replace  advice given to you by your health care provider. Make sure you discuss any questions you have with your health care provider. Document Revised: 02/21/2021 Document Reviewed: 02/21/2021 Elsevier Patient Education  [place].

## 2022-08-04 NOTE — Assessment & Plan Note (Addendum)
Reviewed prior bps and discussed somewhat labile bp at home. Suspect HA, decongestant use contributed to marked elevation. Counseled on bp goal < 130/80 , closer to 120/80. I suspect he could withstand taking hctz 12.9m daily however he prefers to use as needed. He will check BP daily. Continue diltiazem 240 mg daily for HTN and atrial fibrillation.

## 2022-08-04 NOTE — Telephone Encounter (Signed)
Spoke to pt and scheduled him an in office appt on 08/04/22

## 2022-08-04 NOTE — Progress Notes (Signed)
Assessment & Plan:  Primary hypertension Assessment & Plan: Reviewed prior bps and discussed somewhat labile bp at home. Suspect HA, decongestant use contributed to marked elevation. Counseled on bp goal < 130/80 , closer to 120/80. I suspect he could withstand taking hctz 12.15m daily however he prefers to use as needed. He will check BP daily. Continue diltiazem 240 mg daily for HTN and atrial fibrillation.   Orders: -     hydroCHLOROthiazide; Take 0.5 tablets (12.5 mg total) by mouth daily as needed. BP greater than 140/80  Dispense: 90 tablet; Refill: 3 -     VITAMIN D 25 Hydroxy (Vit-D Deficiency, Fractures)  Anemia, unspecified type -     Iron, TIBC and Ferritin Panel -     B12 and Folate Panel  Vitamin D deficiency -     VITAMIN D 25 Hydroxy (Vit-D Deficiency, Fractures)     Return precautions given.   Risks, benefits, and alternatives of the medications and treatment plan prescribed today were discussed, and patient expressed understanding.   Education regarding symptom management and diagnosis given to patient on AVS either electronically or printed.  Return in about 1 month (around 09/02/2022).  MMable Paris FNP  Subjective:    Patient ID: Dalton Sanders male    DOB: 6Mar 21, 1952 72y.o.   MRN: [id number] CC: Dalton STAVROPOULOSis a 72y.o. male who presents today for follow up.   HPI: Elevated BP.Normally 140/80.  BP has been fluctuating and was elevated during URI and HA 5 days ago, 175/90, 185/95.    BP can come down to 116/64 with deep breathing.   He didn't take cardizem 379mprn at that time.   No cp, sob, palpitations.   Endorses stress with running two businesses.   He considered starting ashwanda for anxiety.   1 cup off coffee per day.   History of vitamin D deficiency.  He is taking vitamin D 5000 units daily   He is compliant with diltiazem 240 mg daily.  He takes diltiazem 30 mg as needed for atrial fibrillation.    Presented  to ED 07/30/22 for elevated blood pressure. He had been on theraflu ( with suspected phenylephrine) at that time. GFR 57  Follow-up Dr ArFletcher Anon/22/2023 paroxysmal atrial fibrillation.  Compliant with flecainide, Xarelto. Allergies: Corticosteroids, Iodides, Other, Oysters [shellfish allergy], and Cortisone Current Outpatient Medications on File Prior to Visit  Medication Sig Dispense Refill   albuterol (VENTOLIN HFA) 108 (90 Base) MCG/ACT inhaler TAKE 2 PUFFS BY MOUTH EVERY 6 HOURS AS NEEDED FOR WHEEZE OR SHORTNESS OF BREATH 8.5 each 2   atorvastatin (LIPITOR) 80 MG tablet TAKE 1 TABLET BY MOUTH EVERY DAY 90 tablet 1   Cholecalciferol (VITAMIN D3) 125 MCG (5000 UT) CAPS Take 1 capsule by mouth daily.     Coenzyme Q10 (CO Q 10 PO) Take 1 tablet by mouth daily.     diltiazem (CARDIZEM CD) 240 MG 24 hr capsule TAKE 1 CAPSULE BY MOUTH EVERY DAY (MD NOTED TO FILL FOR 4 DAY SUPPLY FIRST) 90 capsule 3   diltiazem (CARDIZEM) 30 MG tablet TAKE 1 TABLET (30 MG TOTAL) BY MOUTH AS NEEDED. 90 tablet 3   flecainide (TAMBOCOR) 50 MG tablet TAKE 1 TABLET BY MOUTH TWICE A DAY 180 tablet 2   gabapentin (NEURONTIN) 100 MG capsule Take 1 capsule (100 mg total) by mouth 3 (three) times daily. 90 capsule 3   levalbuterol (XOPENEX HFA) 45 MCG/ACT inhaler Inhale 2 puffs into the  lungs 3 (three) times daily. 1 each 12   MAGNESIUM PO Take 1 capsule by mouth daily.     Multiple Vitamin (ONE-A-DAY MENS PO) Take 1 tablet by mouth daily.      omeprazole (PRILOSEC) 20 MG capsule TAKE 1 CAPSULE BY MOUTH EVERY DAY 90 capsule 2   potassium chloride SA (KLOR-CON M) 20 MEQ tablet Take 20 mEq by mouth See admin instructions.     Saw Palmetto, Serenoa repens, (SAW PALMETTO PO) Take 40 mg by mouth daily.      triamcinolone (KENALOG) 0.1 % Apply 1 application topically 2 (two) times daily as needed.     VITAMIN E PO Take 1 capsule by mouth.     XARELTO 20 MG TABS tablet TAKE 1 TABLET BY MOUTH DAILY WITH SUPPER 90 tablet 1   No current  facility-administered medications on file prior to visit.    Review of Systems  Constitutional:  Negative for chills and fever.  Respiratory:  Negative for cough and shortness of breath.   Cardiovascular:  Negative for chest pain and palpitations.  Gastrointestinal:  Negative for nausea and vomiting.  Neurological:  Positive for headaches. Facial asymmetry: improved..     Objective:    BP 138/78   Pulse (!) 58   Temp (!) 97.5 F (36.4 C) (Oral)   Ht 5' 7"$  (1.702 m)   Wt 178 lb 6.4 oz (80.9 kg)   SpO2 99%   BMI 27.94 kg/m  BP Readings from Last 3 Encounters:  08/04/22 138/78  07/30/22 (!) 147/100  06/24/22 (!) 157/81   Wt Readings from Last 3 Encounters:  08/04/22 178 lb 6.4 oz (80.9 kg)  06/24/22 184 lb 9.6 oz (83.7 kg)  06/10/22 182 lb 3.2 oz (82.6 kg)    Physical Exam Vitals reviewed.  Constitutional:      Appearance: He is well-developed.  Cardiovascular:     Rate and Rhythm: Regular rhythm.     Heart sounds: Normal heart sounds.  Pulmonary:     Effort: Pulmonary effort is normal. No respiratory distress.     Breath sounds: Normal breath sounds. No wheezing, rhonchi or rales.  Skin:    General: Skin is warm and dry.  Neurological:     Mental Status: He is alert.  Psychiatric:        Speech: Speech normal.        Behavior: Behavior normal.

## 2022-08-05 LAB — IRON,TIBC AND FERRITIN PANEL
%SAT: 9 % (calc) — ABNORMAL LOW (ref 20–48)
Ferritin: 6 ng/mL — ABNORMAL LOW (ref 24–380)
Iron: 34 ug/dL — ABNORMAL LOW (ref 50–180)
TIBC: 394 mcg/dL (calc) (ref 250–425)

## 2022-08-11 ENCOUNTER — Telehealth: Payer: Self-pay

## 2022-08-11 NOTE — Telephone Encounter (Signed)
        Patient  visited [facility] on 07/30/2022  for elevated blood pressure reading.   Telephone encounter attempt :  1st  A HIPAA compliant voice message was left requesting a return call.  Instructed patient to call back at [phone].   [place] Resource Care Guide   ??[email or web address]  ?? [id number]   Website: [email or web address]  [email or web address]

## 2022-08-18 ENCOUNTER — Telehealth: Payer: Self-pay

## 2022-08-18 NOTE — Telephone Encounter (Signed)
        Patient  visited [facility] on 07/30/2022  for elevated blood pressure reading.   Telephone encounter attempt :  2nd  A HIPAA compliant voice message was left requesting a return call.  Instructed patient to call back at [phone].   [place] Resource Care Guide   [email or web address]  ?? [id number]   Website: [email or web address]  [email or web address]

## 2022-08-19 ENCOUNTER — Telehealth: Payer: Self-pay

## 2022-08-19 NOTE — Telephone Encounter (Signed)
     Patient  visit on 07/30/2022  at [facility] was for elevated blood pressure reading.  Have you been able to follow up with your primary care physician? Yes  The patient was or was not able to obtain any needed medicine or equipment. No medication prescribed.  Are there diet recommendations that you are having difficulty following? No  Patient expresses understanding of discharge instructions and education provided has no other needs at this time. Yes   [place] Resource Care Guide   [email or web address]  ?? [id number]   Website: [email or web address]

## 2022-08-26 IMAGING — US US RENAL
1 series · 14 of 25 positions shown · non-contrast
Comparison: CT chest 05/22/2020

CLINICAL DATA: Chronic renal disease.

EXAM:
RENAL / URINARY TRACT ULTRASOUND COMPLETE

[Series 1: us renal · 49 acquisitions, 14 frames shown]
[im 1/49]
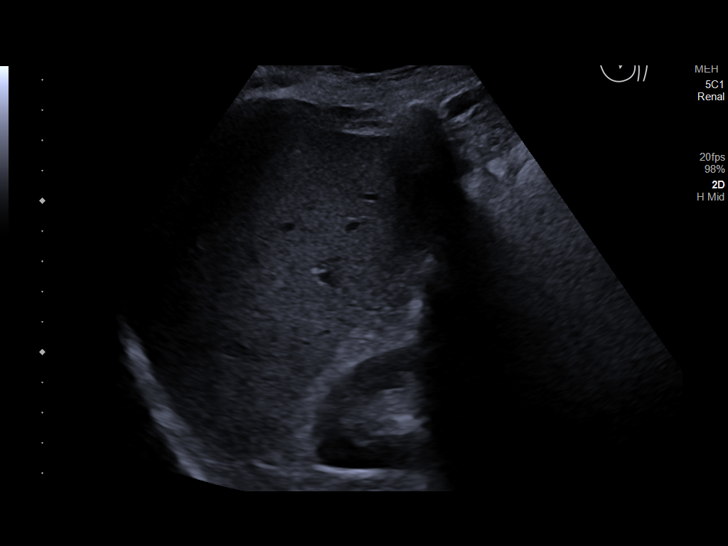
[im 5/49]
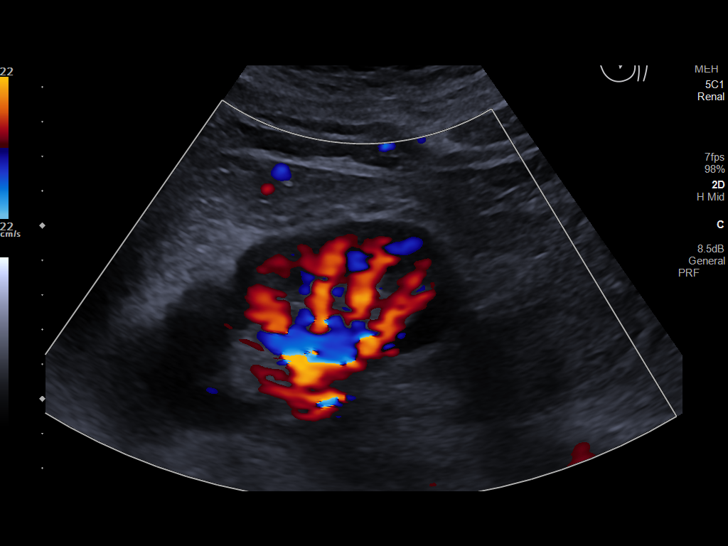
[im 9/49]
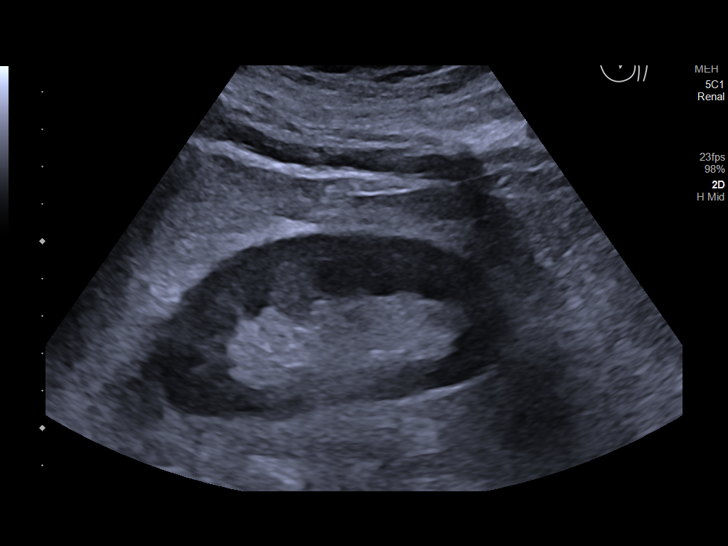
[im 13/49]
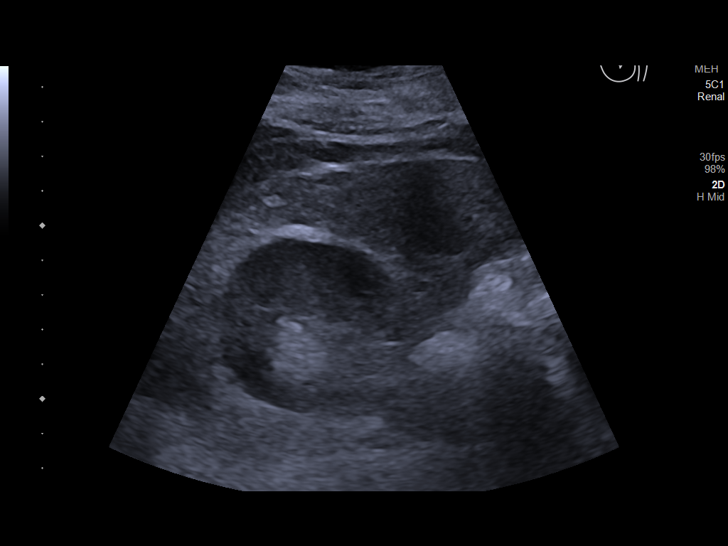
[im 17/49]
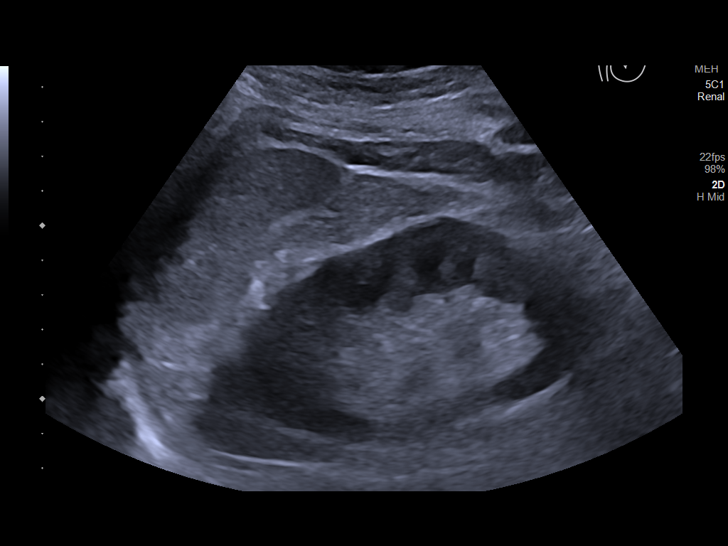
[im 19/49]
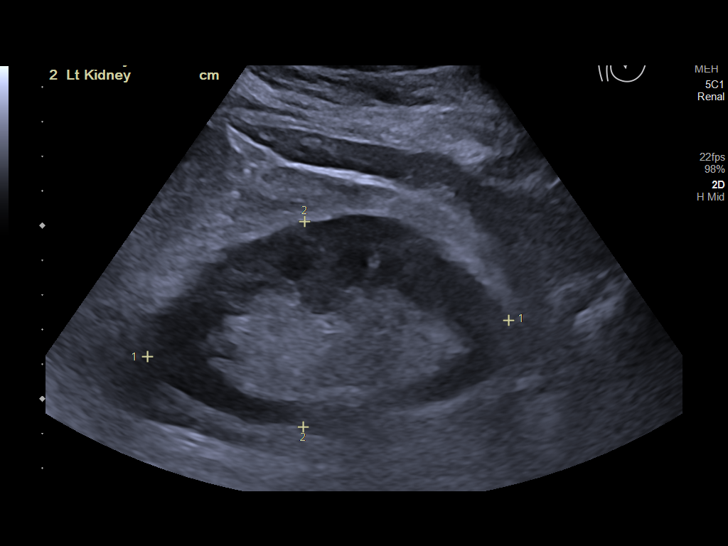
[im 23/49]
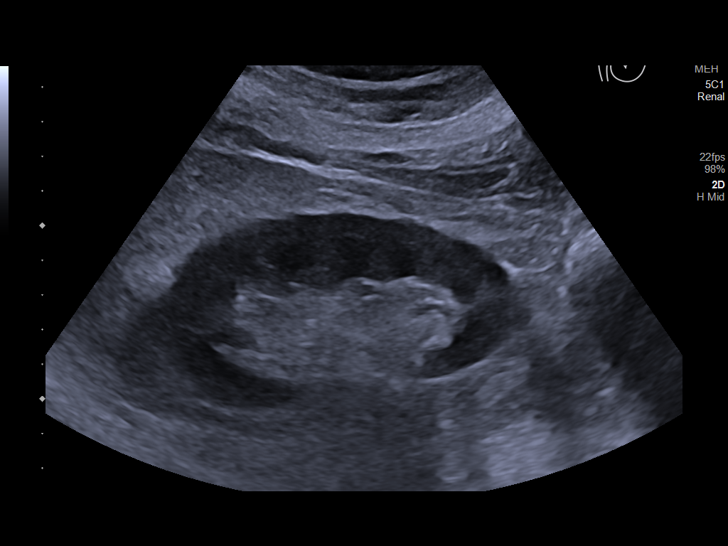
[im 27/49]
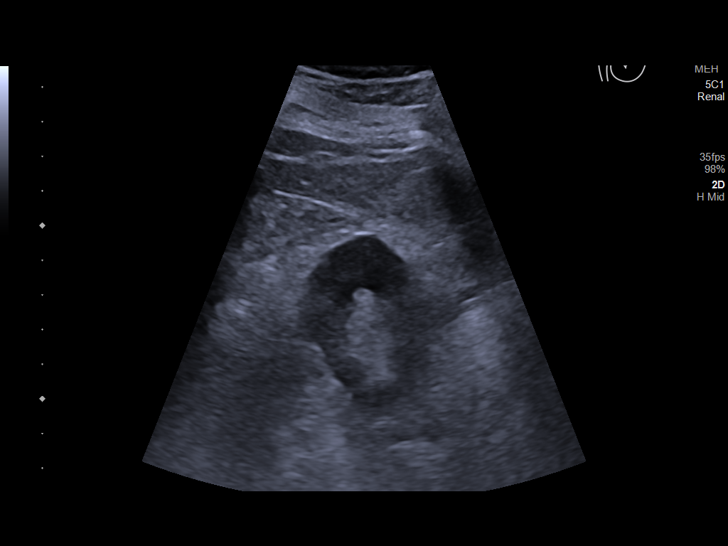
[im 31/49]
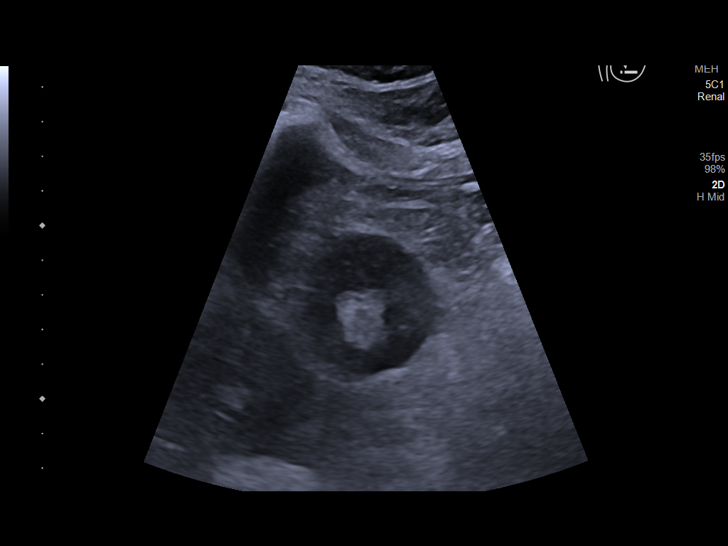
[im 33/49]
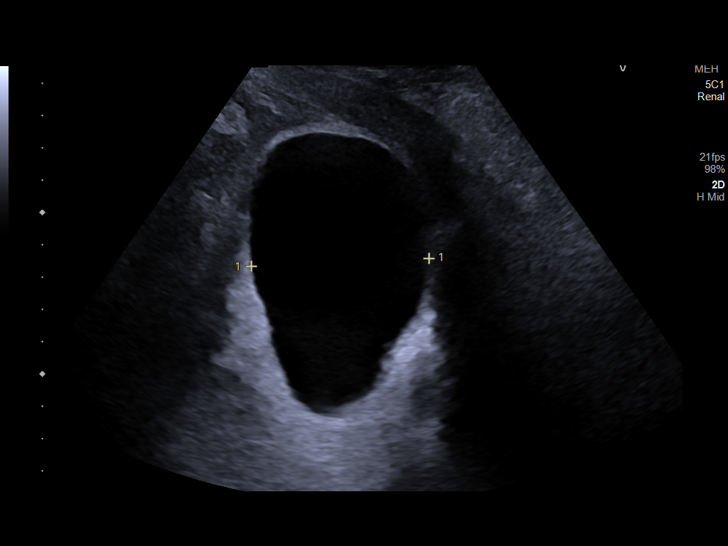
[im 37/49]
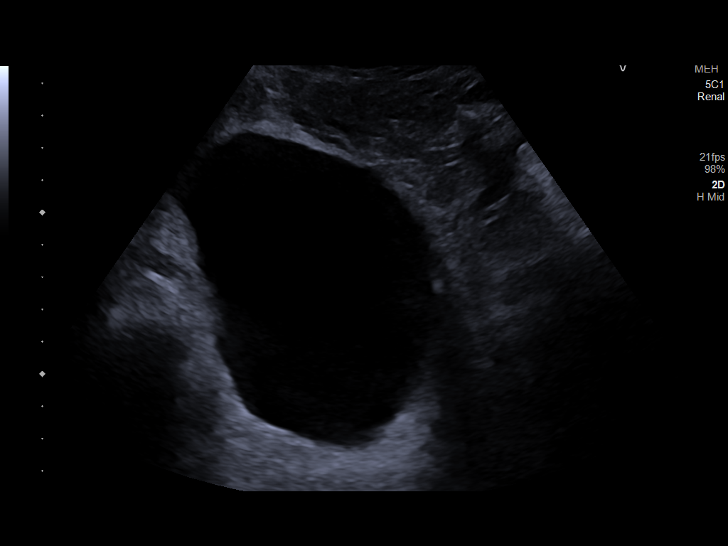
[im 41/49]
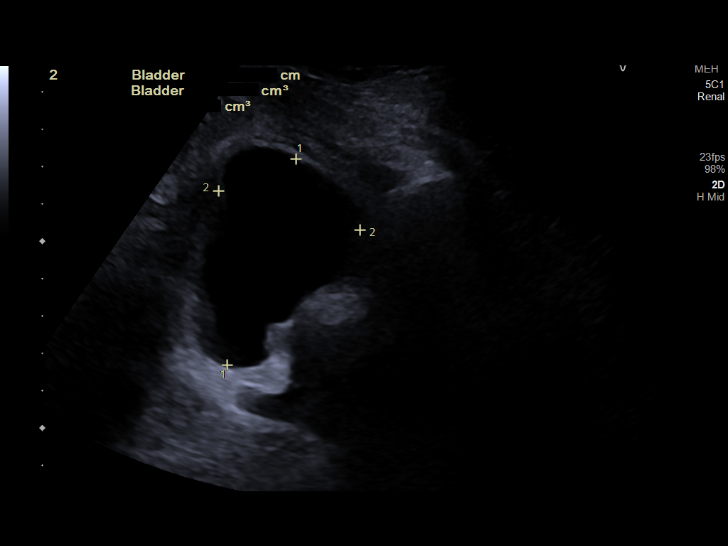
[im 45/49]
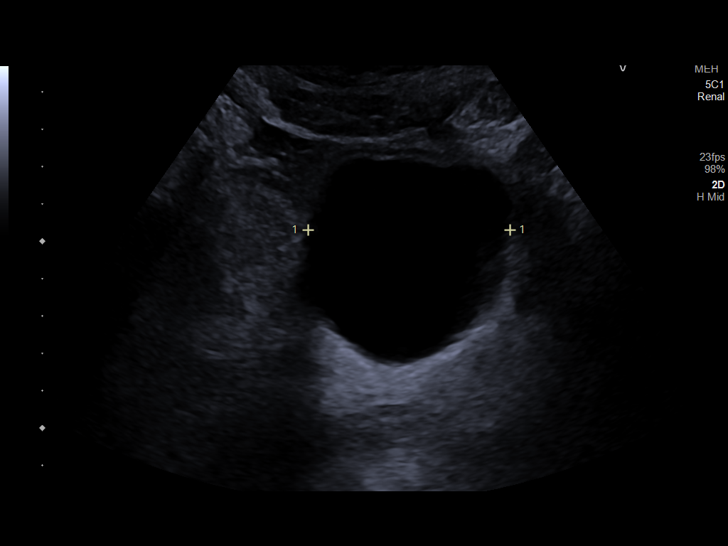
[im 49/49]
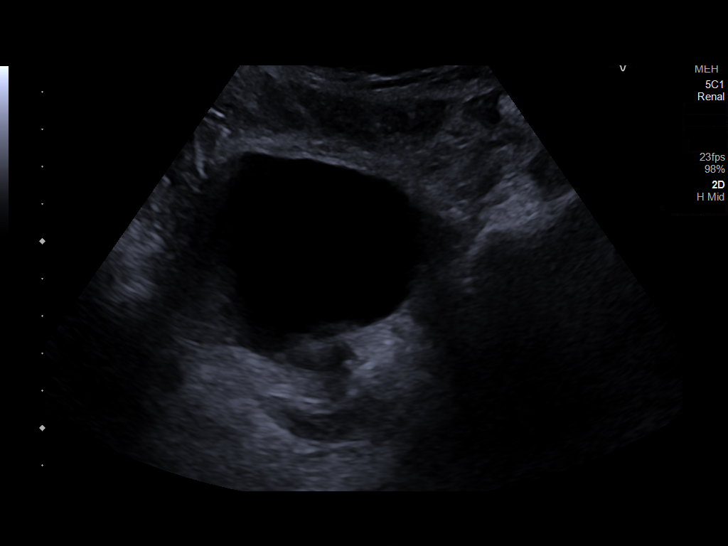

[14 of 25 positions shown; findings below may reference images not displayed]

FINDINGS: Right Kidney:

Renal measurements: 10.3 x 4.7 x 4.3 cm = volume: 110.2 mL.
Echogenicity within normal limits. No mass or hydronephrosis
visualized.

Left Kidney:

Renal measurements: 10.5 x 5.9 x 4.8 cm = volume: 151.0 mL.
Echogenicity within normal limits. No mass or hydronephrosis
visualized.

Bladder:

Appears normal for degree of bladder distention.

Other:

Prevoid: 190.6 cc

Postvoid: 64.8 cc
IMPRESSION: No hydronephrosis.

## 2022-09-12 ENCOUNTER — Encounter: Payer: Self-pay | Admitting: Cardiovascular Disease

## 2022-09-12 ENCOUNTER — Ambulatory Visit: Payer: Medicare Other | Attending: Cardiovascular Disease | Admitting: Cardiovascular Disease

## 2022-09-12 VITALS — BP 130/62 | HR 68 | Ht 66.0 in | Wt 179.0 lb

## 2022-09-12 DIAGNOSIS — I6523 Occlusion and stenosis of bilateral carotid arteries: Secondary | ICD-10-CM

## 2022-09-12 DIAGNOSIS — I480 Paroxysmal atrial fibrillation: Secondary | ICD-10-CM

## 2022-09-12 DIAGNOSIS — I10 Essential (primary) hypertension: Secondary | ICD-10-CM | POA: Diagnosis not present

## 2022-09-12 DIAGNOSIS — E785 Hyperlipidemia, unspecified: Secondary | ICD-10-CM
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
## 2022-09-12 NOTE — Patient Instructions (Signed)
Medication Instructions:  No changes *If you need a refill on your cardiac medications before your next appointment, please call your pharmacy*   Lab Work: None ordered If you have labs (blood work) drawn today and your tests are completely normal, you will receive your results only by: MyChart Message (if you have MyChart) OR A paper copy in the mail If you have any lab test that is abnormal or we need to change your treatment, we will call you to review the results.   Testing/Procedures: None ordered   Follow-Up: At [facility], you and your health needs are our priority.  As part of our continuing mission to provide you with exceptional heart care, we have created designated Provider Care Teams.  These Care Teams include your primary Cardiologist (physician) and Advanced Practice Providers (APPs -  Physician Assistants and Nurse Practitioners) who all work together to provide you with the care you need, when you need it.  We recommend signing up for the patient portal called "MyChart".  Sign up information is provided on this After Visit Summary.  MyChart is used to connect with patients for Virtual Visits (Telemedicine).  Patients are able to view lab/test results, encounter notes, upcoming appointments, etc.  Non-urgent messages can be sent to your provider as well.   To learn more about what you can do with MyChart, go to [email or web address].    Your next appointment:   6 month(s)  Provider:   You may see Muhammad Arida, MD or one of the following Advanced Practice Providers on your designated Care Team:   Christopher Berge, NP Ryan Dunn, PA-C Cadence Furth, PA-C Sheri Hammock, NP    

## 2022-09-18 ENCOUNTER — Other Ambulatory Visit: Payer: Self-pay | Admitting: Cardiovascular Disease

## 2022-10-13 ENCOUNTER — Encounter: Payer: Self-pay | Admitting: Family

## 2022-10-13 ENCOUNTER — Ambulatory Visit (INDEPENDENT_AMBULATORY_CARE_PROVIDER_SITE_OTHER): Payer: Medicare Other | Admitting: Family

## 2022-10-13 VITALS — BP 136/78 | HR 61 | Temp 97.5°F | Ht 66.0 in | Wt 178.2 lb

## 2022-10-13 DIAGNOSIS — I10 Essential (primary) hypertension: Secondary | ICD-10-CM | POA: Diagnosis not present

## 2022-10-13 DIAGNOSIS — D649 Anemia, unspecified: Secondary | ICD-10-CM | POA: Insufficient documentation

## 2022-10-13 DIAGNOSIS — M25551 Pain in right hip: Secondary | ICD-10-CM | POA: Diagnosis not present

## 2022-10-13 DIAGNOSIS — D509 Iron deficiency anemia, unspecified: Secondary | ICD-10-CM | POA: Diagnosis not present

## 2022-10-13 DIAGNOSIS — R399 Unspecified symptoms and signs involving the genitourinary system: Secondary | ICD-10-CM

## 2022-10-13 LAB — URINALYSIS, ROUTINE W REFLEX MICROSCOPIC
Bilirubin Urine: NEGATIVE
Leukocytes,Ua: NEGATIVE
Nitrite: NEGATIVE
Specific Gravity, Urine: 1.015 (ref 1.000–1.030)
Total Protein, Urine: NEGATIVE
Urine Glucose: NEGATIVE
Urobilinogen, UA: 0.2 (ref 0.0–1.0)
pH: 7 (ref 5.0–8.0)

## 2022-10-13 LAB — CBC WITH DIFFERENTIAL/PLATELET
Basophils Absolute: 0.1 10*3/uL (ref 0.0–0.1)
Basophils Relative: 0.9 % (ref 0.0–3.0)
Eosinophils Absolute: 0.4 10*3/uL (ref 0.0–0.7)
Eosinophils Relative: 5.6 % — ABNORMAL HIGH (ref 0.0–5.0)
HCT: 35.3 % — ABNORMAL LOW (ref 39.0–52.0)
Hemoglobin: 11.7 g/dL — ABNORMAL LOW (ref 13.0–17.0)
Lymphocytes Relative: 38.1 % (ref 12.0–46.0)
Lymphs Abs: 2.9 10*3/uL (ref 0.7–4.0)
MCHC: 33.1 g/dL (ref 30.0–36.0)
MCV: 85.8 fl (ref 78.0–100.0)
Monocytes Absolute: 0.6 10*3/uL (ref 0.1–1.0)
Monocytes Relative: 8.3 % (ref 3.0–12.0)
Neutro Abs: 3.6 10*3/uL (ref 1.4–7.7)
Neutrophils Relative %: 47.1 % (ref 43.0–77.0)
Platelets: 308 10*3/uL (ref 150.0–400.0)
RBC: 4.12 Mil/uL — ABNORMAL LOW (ref 4.22–5.81)
RDW: 20.8 % — ABNORMAL HIGH (ref 11.5–15.5)
WBC: 7.6 10*3/uL (ref 4.0–10.5)

## 2022-10-13 MED ORDER — HYDROCHLOROTHIAZIDE 25 MG PO TABS: 25.0000 mg | ORAL_TABLET | Freq: Every day | ORAL | 3 refills | Status: DC

## 2022-10-13 NOTE — Progress Notes (Signed)
Assessment & Plan:  Iron deficiency anemia, unspecified iron deficiency anemia type Assessment & Plan: Pending repeat studies today.  Patient has not been entirely compliant taking ferrous sulfate due to concern for constipation.  Colonoscopy is reported as up-to-date.  Call out to [facility] clinic to request actual report  Orders: -     CBC with Differential/Platelet -     Iron, TIBC and Ferritin Panel  Primary hypertension Assessment & Plan: Suboptimal control.  Increase HCTZ to 25 mg daily, continue diltiazem 240 mg daily for HTN and atrial fibrillation.   Orders: -     Basic metabolic panel; Future -     hydroCHLOROthiazide; Take 1 tablet (25 mg total) by mouth daily.  Dispense: 90 tablet; Refill: 3  Right hip pain -     Ambulatory referral to Orthopedic Surgery  UTI symptoms -     Urinalysis, Routine w reflex microscopic     Return precautions given.   Risks, benefits, and alternatives of the medications and treatment plan prescribed today were discussed, and patient expressed understanding.   Education regarding symptom management and diagnosis given to patient on AVS either electronically or printed.  Return in about 4 months (around 02/12/2023).  [name], FNP  Subjective:    Patient ID: Dalton Sanders, male    DOB: 15-Oct-1950, 72 y.o.   MRN: [id number]  CC: Dalton Sanders is a 72 y.o. male who presents today for follow up.   HPI: Follow-up anemia.  Patient has taken ferrous sulfate 325 mg periodically for 8 weeks.   No hematuria, rectal bleeding.  Colonoscopy is up-to-date     Follow-up cardiology, Dr Kirke Corin 09/12/2022.  Paroxysmal atrial fibrillation- Continue flecainide, Xarelto.  No medication changes   hypertension-compliant with hydrochlorothiazide 12.5mg  most days.  Occasionally he will take hydrochlorothiazide 25 mg daily.  Blood pressure at home typically in the mid 130s over 80.  He remains compliant with diltiazem 240 mg   Consult  with Dr Myer Haff 06/24/22 for right hip pain. He has had consult with Dr Audelia Acton at [place] regarding right hip replacement.  He is interested in second opinion.  He is not taking gabapentin however using Tylenol arthritis   Allergies: Corticosteroids, Iodides, Other, Oysters [shellfish allergy], and Cortisone Current Outpatient Medications on File Prior to Visit  Medication Sig Dispense Refill   albuterol (VENTOLIN HFA) 108 (90 Base) MCG/ACT inhaler TAKE 2 PUFFS BY MOUTH EVERY 6 HOURS AS NEEDED FOR WHEEZE OR SHORTNESS OF BREATH 8.5 each 2   atorvastatin (LIPITOR) 80 MG tablet TAKE 1 TABLET BY MOUTH EVERY DAY 90 tablet 1   Cholecalciferol (VITAMIN D3) 125 MCG (5000 UT) CAPS Take 1 capsule by mouth daily.     Coenzyme Q10 (CO Q 10 PO) Take 1 tablet by mouth daily.     diltiazem (CARDIZEM CD) 240 MG 24 hr capsule TAKE 1 CAPSULE BY MOUTH EVERY DAY (MD NOTED TO FILL FOR 4 DAY SUPPLY FIRST) 90 capsule 3   diltiazem (CARDIZEM) 30 MG tablet TAKE 1 TABLET (30 MG TOTAL) BY MOUTH AS NEEDED. 90 tablet 3   flecainide (TAMBOCOR) 50 MG tablet TAKE 1 TABLET BY MOUTH TWICE A DAY 180 tablet 2   levalbuterol (XOPENEX HFA) 45 MCG/ACT inhaler Inhale 2 puffs into the lungs 3 (three) times daily. 1 each 12   MAGNESIUM PO Take 1 capsule by mouth daily.     Multiple Vitamin (ONE-A-DAY MENS PO) Take 1 tablet by mouth daily.      omeprazole (PRILOSEC)  20 MG capsule TAKE 1 CAPSULE BY MOUTH EVERY DAY 90 capsule 2   potassium chloride SA (KLOR-CON M) 20 MEQ tablet Take 20 mEq by mouth See admin instructions.     Saw Palmetto, Serenoa repens, (SAW PALMETTO PO) Take 40 mg by mouth daily.      triamcinolone (KENALOG) 0.1 % Apply 1 application topically 2 (two) times daily as needed.     VITAMIN E PO Take 1 capsule by mouth.     XARELTO 20 MG TABS tablet TAKE 1 TABLET BY MOUTH DAILY WITH SUPPER 90 tablet 1   No current facility-administered medications on file prior to visit.    Review of Systems  Constitutional:   Negative for chills and fever.  Respiratory:  Negative for cough.   Cardiovascular:  Negative for chest pain and palpitations.  Gastrointestinal:  Negative for nausea and vomiting.  Musculoskeletal:  Positive for arthralgias and back pain.      Objective:    BP 136/78   Pulse 61   Temp (!) 97.5 F (36.4 C) (Oral)   Ht  (1.676 m)   Wt 178 lb 3.2 oz (80.8 kg)   SpO2 97%   BMI 28.76 kg/m  BP Readings from Last 3 Encounters:  10/13/22 136/78  09/12/22 130/62  08/04/22 138/78   Wt Readings from Last 3 Encounters:  10/13/22 178 lb 3.2 oz (80.8 kg)  09/12/22 179 lb (81.2 kg)  08/04/22 178 lb 6.4 oz (80.9 kg)    Physical Exam Vitals reviewed.  Constitutional:      Appearance: He is well-developed.  Cardiovascular:     Rate and Rhythm: Regular rhythm.     Heart sounds: Normal heart sounds.  Pulmonary:     Effort: Pulmonary effort is normal. No respiratory distress.     Breath sounds: Normal breath sounds. No wheezing, rhonchi or rales.  Skin:    General: Skin is warm and dry.  Neurological:     Mental Status: He is alert.  Psychiatric:        Speech: Speech normal.        Behavior: Behavior normal.

## 2022-10-13 NOTE — Assessment & Plan Note (Signed)
Suboptimal control.  Increase HCTZ to 25 mg daily, continue diltiazem 240 mg daily for HTN and atrial fibrillation.

## 2022-10-13 NOTE — Assessment & Plan Note (Addendum)
Pending repeat studies today.  Patient has not been entirely compliant taking ferrous sulfate due to concern for constipation.  Colonoscopy is reported as up-to-date.  Call out to [facility] clinic to request actual report

## 2022-10-14 LAB — IRON,TIBC AND FERRITIN PANEL
%SAT: 13 % (calc) — ABNORMAL LOW (ref 20–48)
Ferritin: 8 ng/mL — ABNORMAL LOW (ref 24–380)
Iron: 48 ug/dL — ABNORMAL LOW (ref 50–180)
TIBC: 377 mcg/dL (calc) (ref 250–425)

## 2022-10-15 ENCOUNTER — Telehealth: Payer: Self-pay | Admitting: Family

## 2022-10-15 NOTE — Telephone Encounter (Signed)
Pt called in staying that he received a message through Mychart from [facility] saying that he needs to repeat blood work in 2 weeks. I booked pt for 5/9, however theres no orders in?

## 2022-10-15 NOTE — Addendum Note (Signed)
Addended by: [place], Jeneva Schweizer on: 10/15/2022 09:53 AM   Modules accepted: Orders

## 2022-10-15 NOTE — Telephone Encounter (Signed)
ORDERS ARE IN.

## 2022-10-20 DIAGNOSIS — M1611 Unilateral primary osteoarthritis, right hip: Secondary | ICD-10-CM | POA: Diagnosis not present

## 2022-10-30 ENCOUNTER — Other Ambulatory Visit (INDEPENDENT_AMBULATORY_CARE_PROVIDER_SITE_OTHER): Payer: Medicare Other

## 2022-10-30 DIAGNOSIS — I10 Essential (primary) hypertension: Secondary | ICD-10-CM

## 2022-10-30 DIAGNOSIS — R399 Unspecified symptoms and signs involving the genitourinary system: Secondary | ICD-10-CM

## 2022-10-30 LAB — BASIC METABOLIC PANEL
BUN: 20 mg/dL (ref 6–23)
CO2: 29 mEq/L (ref 19–32)
Calcium: 9.1 mg/dL (ref 8.4–10.5)
Chloride: 101 mEq/L (ref 96–112)
Creatinine, Ser: 1.27 mg/dL (ref 0.40–1.50)
GFR: 57.08 mL/min — ABNORMAL LOW (ref >60.00)
Glucose, Bld: 150 mg/dL — ABNORMAL HIGH (ref 70–99)
Potassium: 3.8 mEq/L (ref 3.5–5.1)
Sodium: 139 mEq/L (ref 135–145)

## 2022-10-30 LAB — URINALYSIS, ROUTINE W REFLEX MICROSCOPIC
Bilirubin Urine: NEGATIVE
Ketones, ur: NEGATIVE
Leukocytes,Ua: NEGATIVE
Nitrite: NEGATIVE
Specific Gravity, Urine: 1.01 (ref 1.000–1.030)
Total Protein, Urine: NEGATIVE
Urine Glucose: NEGATIVE
Urobilinogen, UA: 0.2 (ref 0.0–1.0)
pH: 7.5 (ref 5.0–8.0)

## 2022-10-31 LAB — URINE CULTURE
MICRO NUMBER:: 14935445
Result:: NO GROWTH
SPECIMEN QUALITY:: ADEQUATE

## 2022-11-03 ENCOUNTER — Other Ambulatory Visit: Payer: Self-pay | Admitting: Family

## 2022-11-03 DIAGNOSIS — R319 Hematuria, unspecified: Secondary | ICD-10-CM

## 2022-11-13 DIAGNOSIS — I4891 Unspecified atrial fibrillation: Secondary | ICD-10-CM | POA: Diagnosis not present

## 2022-11-13 DIAGNOSIS — I493 Ventricular premature depolarization: Secondary | ICD-10-CM | POA: Diagnosis not present

## 2022-11-13 DIAGNOSIS — I440 Atrioventricular block, first degree: Secondary | ICD-10-CM | POA: Diagnosis not present

## 2022-11-13 DIAGNOSIS — I10 Essential (primary) hypertension: Secondary | ICD-10-CM | POA: Diagnosis not present

## 2022-11-13 DIAGNOSIS — R079 Chest pain, unspecified: Secondary | ICD-10-CM | POA: Diagnosis not present

## 2022-11-14 DIAGNOSIS — I491 Atrial premature depolarization: Secondary | ICD-10-CM | POA: Diagnosis not present

## 2022-11-14 DIAGNOSIS — I493 Ventricular premature depolarization: Secondary | ICD-10-CM | POA: Diagnosis not present

## 2022-11-14 DIAGNOSIS — R079 Chest pain, unspecified: Secondary | ICD-10-CM | POA: Diagnosis not present

## 2022-11-14 DIAGNOSIS — I4891 Unspecified atrial fibrillation: Secondary | ICD-10-CM | POA: Diagnosis not present

## 2022-11-14 DIAGNOSIS — I10 Essential (primary) hypertension: Secondary | ICD-10-CM | POA: Diagnosis not present

## 2022-11-17 ENCOUNTER — Other Ambulatory Visit: Payer: Self-pay | Admitting: Cardiovascular Disease

## 2022-11-18 ENCOUNTER — Telehealth: Payer: Self-pay | Admitting: Family

## 2022-11-18 NOTE — Telephone Encounter (Signed)
Pt would like to be called regarding a hospital stay at [facility]. Pt stated they changed some of his medications so he need labs drawn

## 2022-11-18 NOTE — Telephone Encounter (Signed)
Good Morning,  Please advise if ok to refill medication or defer to PCP. Thank you so much.

## 2022-11-19 ENCOUNTER — Ambulatory Visit (INDEPENDENT_AMBULATORY_CARE_PROVIDER_SITE_OTHER): Payer: Medicare Other | Admitting: Urology

## 2022-11-19 ENCOUNTER — Encounter: Payer: Self-pay | Admitting: Family

## 2022-11-19 ENCOUNTER — Encounter: Payer: Self-pay | Admitting: Urology

## 2022-11-19 VITALS — BP 136/77 | HR 65 | Ht 67.0 in | Wt 173.0 lb

## 2022-11-19 DIAGNOSIS — R3121 Asymptomatic microscopic hematuria: Secondary | ICD-10-CM | POA: Diagnosis not present

## 2022-11-19 DIAGNOSIS — R319 Hematuria, unspecified: Secondary | ICD-10-CM | POA: Insufficient documentation

## 2022-11-19 DIAGNOSIS — Z125 Encounter for screening for malignant neoplasm of prostate: Secondary | ICD-10-CM

## 2022-11-19 NOTE — Progress Notes (Signed)
   11/19/2022 12:17 PM   Dalton Sanders 10-Jun-1951 [id number]  Reason for visit: Asymptomatic microscopic hematuria, PSA screening  HPI: 72 year old male I have followed for PSA screening, most recently normal at 2.9 from September 2023.  We have previously reviewed guidelines that do not recommend routine screening in men over age 37, but after discussing risks and benefits he opted to continue PSA screening.  He was referred back for recent episode of microscopic hematuria with 3-6 RBCs seen on UA x 2.  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  He denies any gross hematuria, dysuria, or other urinary symptoms.  Had recent cardiac workup at [facility], and has a lot of stress with his daughter being hospitalized.  He had a renal/bladder ultrasound ordered in June 2023 for CKD which was benign.  I personally viewed and interpreted those images and no abnormal findings noted.  We discussed common possible etiologies of microscopic hematuria including idiopathic, urolithiasis, medical renal disease, and malignancy. We discussed the new asymptomatic microscopic hematuria guidelines and risk categories of low, intermediate, and high risk that are based on age, risk factors like smoking, and degree of microscopic hematuria. We discussed work-up can range from repeat urinalysis, renal ultrasound and cystoscopy, to CT urogram and cystoscopy.  Using shared decision making, he opted to defer further imaging with CT and cystoscopy, we discussed the risk of missing a clinically significant malignancy and he understands these risks and would like to repeat urinalysis in September 2024.  If increased degree of microscopic hematuria or gross hematuria he would be willing to pursue further evaluation at that time.  Return precautions were discussed at length   Sondra Come, MD  [facility] [address] [place], [place] [address] [phone]

## 2022-11-21 ENCOUNTER — Encounter: Payer: Self-pay | Admitting: Family

## 2022-11-21 ENCOUNTER — Ambulatory Visit (INDEPENDENT_AMBULATORY_CARE_PROVIDER_SITE_OTHER): Payer: Medicare Other | Admitting: Family

## 2022-11-21 VITALS — BP 136/80 | HR 62 | Ht 67.0 in | Wt 175.8 lb

## 2022-11-21 DIAGNOSIS — E876 Hypokalemia: Secondary | ICD-10-CM | POA: Diagnosis not present

## 2022-11-21 DIAGNOSIS — F411 Generalized anxiety disorder: Secondary | ICD-10-CM | POA: Insufficient documentation

## 2022-11-21 DIAGNOSIS — D509 Iron deficiency anemia, unspecified: Secondary | ICD-10-CM | POA: Diagnosis not present

## 2022-11-21 DIAGNOSIS — I10 Essential (primary) hypertension: Secondary | ICD-10-CM | POA: Diagnosis not present

## 2022-11-21 DIAGNOSIS — F419 Anxiety disorder, unspecified: Secondary | ICD-10-CM

## 2022-11-21 DIAGNOSIS — E785 Hyperlipidemia, unspecified: Secondary | ICD-10-CM | POA: Diagnosis not present

## 2022-11-21 MED ORDER — ATORVASTATIN CALCIUM 80 MG PO TABS: 80.0000 mg | ORAL_TABLET | Freq: Every day | ORAL | 3 refills | Status: DC

## 2022-11-21 MED ORDER — HYDROXYZINE HCL 10 MG PO TABS: 10.0000 mg | ORAL_TABLET | Freq: Two times a day (BID) | ORAL | 0 refills | Status: DC | PRN

## 2022-11-21 NOTE — Progress Notes (Unsigned)
Assessment & Plan:  Hypokalemia -     Basic metabolic panel -     TSH  Hyperlipidemia, unspecified hyperlipidemia type -     Atorvastatin Calcium; Take 1 tablet (80 mg total) by mouth daily.  Dispense: 90 tablet; Refill: 3  GAD (generalized anxiety disorder) Assessment & Plan: Uncontrolled.  Patient prefers as needed medication.  Start hydroxyzine 10 mg twice daily as needed  Orders: -     hydrOXYzine HCl; Take 1 tablet (10 mg total) by mouth 2 (two) times daily as needed for anxiety.  Dispense: 90 tablet; Refill: 0 -     TSH  Iron deficiency anemia, unspecified iron deficiency anemia type -     Iron, TIBC and Ferritin Panel  Primary hypertension Assessment & Plan: Discussed ED course.  Fortunately chest pain has resolved.  He is mailing holter monitor.  hypokalemia, since resolved.  Continue hydrochlorothiazide 25 mg daily, potassium chloride 10 mEq 3 tablets daily.  Pending BMP in 2 weeks to ensure stable and determine appropriate dose of potassium chloride going forward Lab Results  Component Value Date   K 4.0 11/21/2022         Return precautions given.   Risks, benefits, and alternatives of the medications and treatment plan prescribed today were discussed, and patient expressed understanding.   Education regarding symptom management and diagnosis given to patient on AVS either electronically or printed.  Return in about 6 weeks (around 01/02/2023).  Dalton Plowman, FNP  Subjective:    Patient ID: Dalton Sanders, male    DOB: 1950-11-30, 72 y.o.   MRN: [id number]  CC: Dalton Sanders is a 72 y.o. male who presents today for follow up.   HPI: He feels well today.  CP has resolved. No recurrence.  He is worried about anxiety and interested in medication . He is always thinking about work. He is self employed. No depression.   He describes 8 days ago  ago feeling left sided CP. Pain was not excruciating.    Describes as 'pressure'. If he thinks  'happy thoughts' , pain will resolve.   Endorses feeling overwhelmed with stress with worry over daughter and wife and he feels contributed to CP.    He is mailing back Holter monitor today.      He was seen at [facility] ED 11/13/2022 for chest pain ED course significant for echocardiogram negative for stress-induced ischemia.  No obvious evidence of ischemia from testing.  EKG showed sinus tachycardia.  QT interval 382. Discussed hypokalemia.  Discussed concern for QT prolongation, and/or arrhythmia.  Question if related to dyspepsia.  He is no longer on omeprazole.     He was started on potassium chloride 10 mEq 3 tablets once daily for 21 days.  Discharged on  Protonix 40 mg daily for 90-days which he didn't start  CXR without acute findings K 3.4  Troponin 5, 6 BNP 72 Hemoglobin 13.1  Compliant with flecainide, diltiazem and Eliquis.  He follows with Dr Kirke Corin.  Last seen 09/12/2022 for  paroxysmal atrial fibrillation, hypertension.   Allergies: Corticosteroids, Iodides, Other, Oysters [shellfish allergy], and Cortisone Current Outpatient Medications on File Prior to Visit  Medication Sig Dispense Refill   Cholecalciferol (VITAMIN D3) 125 MCG (5000 UT) CAPS Take 1 capsule by mouth daily.     Coenzyme Q10 (CO Q 10 PO) Take 1 tablet by mouth daily.     diltiazem (CARDIZEM CD) 240 MG 24 hr capsule TAKE 1 CAPSULE BY MOUTH EVERY DAY (  MD NOTED TO FILL FOR 4 DAY SUPPLY FIRST) 90 capsule 3   diltiazem (CARDIZEM) 30 MG tablet TAKE 1 TABLET (30 MG TOTAL) BY MOUTH AS NEEDED. 90 tablet 3   flecainide (TAMBOCOR) 50 MG tablet TAKE 1 TABLET BY MOUTH TWICE A DAY 180 tablet 2   hydrochlorothiazide (HYDRODIURIL) 25 MG tablet Take 1 tablet (25 mg total) by mouth daily. 90 tablet 3   MAGNESIUM PO Take 1 capsule by mouth daily.     Multiple Vitamin (ONE-A-DAY MENS PO) Take 1 tablet by mouth daily.      pantoprazole (PROTONIX) 40 MG tablet Take 40 mg by mouth daily.     potassium chloride SA (KLOR-CON  M) 20 MEQ tablet Take 20 mEq by mouth See admin instructions.     Saw Palmetto, Serenoa repens, (SAW PALMETTO PO) Take 40 mg by mouth daily.      triamcinolone (KENALOG) 0.1 % Apply 1 application topically 2 (two) times daily as needed.     VITAMIN E PO Take 1 capsule by mouth.     XARELTO 20 MG TABS tablet TAKE 1 TABLET BY MOUTH DAILY WITH SUPPER 90 tablet 1   albuterol (VENTOLIN HFA) 108 (90 Base) MCG/ACT inhaler TAKE 2 PUFFS BY MOUTH EVERY 6 HOURS AS NEEDED FOR WHEEZE OR SHORTNESS OF BREATH (Patient not taking: Reported on 11/21/2022) 8.5 each 2   levalbuterol (XOPENEX HFA) 45 MCG/ACT inhaler Inhale 2 puffs into the lungs 3 (three) times daily. (Patient not taking: Reported on 11/21/2022) 1 each 12   No current facility-administered medications on file prior to visit.    Review of Systems  Constitutional:  Negative for chills and fever.  Respiratory:  Negative for cough.   Cardiovascular:  Negative for chest pain (resolved) and palpitations.  Gastrointestinal:  Negative for nausea and vomiting.  Psychiatric/Behavioral:  The patient is nervous/anxious.       Objective:    BP 136/80   Pulse 62   Ht 5\' 7"  (1.702 m)   Wt 175 lb 12.8 oz (79.7 kg)   SpO2 97%   BMI 27.53 kg/m  BP Readings from Last 3 Encounters:  11/21/22 136/80  11/19/22 136/77  10/13/22 136/78   Wt Readings from Last 3 Encounters:  11/21/22 175 lb 12.8 oz (79.7 kg)  11/19/22 173 lb (78.5 kg)  10/13/22 178 lb 3.2 oz (80.8 kg)      10/13/2022   10:46 AM 08/04/2022   10:27 AM 06/10/2022   10:16 AM  Depression screen PHQ 2/9  Decreased Interest 0 0 0  Down, Depressed, Hopeless 0 0 0  PHQ - 2 Score 0 0 0  Altered sleeping  0   Tired, decreased energy  0   Change in appetite  0   Feeling bad or failure about yourself   0   Trouble concentrating  0   Moving slowly or fidgety/restless  0   Suicidal thoughts  0   PHQ-9 Score  0   Difficult doing work/chores  Not difficult at all      Physical Exam Vitals  reviewed.  Constitutional:      Appearance: He is well-developed.  Cardiovascular:     Rate and Rhythm: Regular rhythm.     Heart sounds: Normal heart sounds.  Pulmonary:     Effort: Pulmonary effort is normal. No respiratory distress.     Breath sounds: Normal breath sounds. No wheezing, rhonchi or rales.  Skin:    General: Skin is warm and dry.  Neurological:  Mental Status: He is alert.  Psychiatric:        Speech: Speech normal.        Behavior: Behavior normal.

## 2022-11-21 NOTE — Telephone Encounter (Signed)
Pt is scheduled to see Claris Che, NP today at 3pm.

## 2022-11-22 LAB — BASIC METABOLIC PANEL
BUN/Creatinine Ratio: 14 (calc) (ref 6–22)
BUN: 19 mg/dL (ref 7–25)
CO2: 26 mmol/L (ref 20–32)
Calcium: 9.1 mg/dL (ref 8.6–10.3)
Chloride: 101 mmol/L (ref 98–110)
Creat: 1.37 mg/dL — ABNORMAL HIGH (ref 0.70–1.28)
Glucose, Bld: 125 mg/dL — ABNORMAL HIGH (ref 65–99)
Potassium: 4 mmol/L (ref 3.5–5.3)
Sodium: 140 mmol/L (ref 135–146)

## 2022-11-22 LAB — IRON,TIBC AND FERRITIN PANEL
%SAT: 12 % (calc) — ABNORMAL LOW (ref 20–48)
Ferritin: 12 ng/mL — ABNORMAL LOW (ref 24–380)
Iron: 44 ug/dL — ABNORMAL LOW (ref 50–180)
TIBC: 359 mcg/dL (calc) (ref 250–425)

## 2022-11-22 LAB — TSH: TSH: 1.85 mIU/L (ref 0.40–4.50)

## 2022-11-24 ENCOUNTER — Other Ambulatory Visit: Payer: Self-pay | Admitting: Family

## 2022-11-24 DIAGNOSIS — R899 Unspecified abnormal finding in specimens from other organs, systems and tissues: Secondary | ICD-10-CM

## 2022-11-24 DIAGNOSIS — I10 Essential (primary) hypertension: Secondary | ICD-10-CM

## 2022-11-24 DIAGNOSIS — R7309 Other abnormal glucose: Secondary | ICD-10-CM

## 2022-11-24 DIAGNOSIS — D649 Anemia, unspecified: Secondary | ICD-10-CM

## 2022-11-24 NOTE — Assessment & Plan Note (Addendum)
Discussed ED course.  Fortunately chest pain has resolved.  He is mailing holter monitor.  hypokalemia, since resolved.  Continue hydrochlorothiazide 25 mg daily, potassium chloride 10 mEq 3 tablets daily.  Pending BMP in 2 weeks to ensure stable and determine appropriate dose of potassium chloride going forward Lab Results  Component Value Date   K 4.0 11/21/2022

## 2022-11-24 NOTE — Assessment & Plan Note (Addendum)
Uncontrolled.  Patient prefers as needed medication.  Start hydroxyzine 10 mg twice daily as needed

## 2022-11-26 NOTE — Addendum Note (Signed)
Addended by: [place], Ordell Prichett on: 11/26/2022 04:49 PM   Modules accepted: Orders

## 2022-11-27 DIAGNOSIS — I4891 Unspecified atrial fibrillation: Secondary | ICD-10-CM | POA: Diagnosis not present

## 2022-12-02 NOTE — Progress Notes (Signed)
Spoke to pt and he is taking the potassium 10 mEq 3 tablets daily . Ordered  BMP  and scheduled lab appt for 12/22/22

## 2022-12-11 ENCOUNTER — Telehealth: Payer: Self-pay

## 2022-12-11 DIAGNOSIS — I10 Essential (primary) hypertension: Secondary | ICD-10-CM

## 2022-12-11 NOTE — Telephone Encounter (Signed)
Prescription Request  12/11/2022  LOV: Visit date not found  What is the name of the medication or equipment?  potassium chloride SA (KLOR-CON M) 20 MEQ tablet - patient states he received this originally from the [facility] ED (Dr. Seymour Bars), but it was 30 MEQ tablet.  Have you contacted your pharmacy to request a refill? No   Which pharmacy would you like this sent to?  [id number], [place] - [address] [place] [address] Phone: [phone] Fax: [phone]    Patient notified that their request is being sent to the clinical staff for review and that they should receive a response within 2 business days.   Please advise at Mobile [phone] (mobile)  Patient states he has been out for three days, so he wanted [name], FNP, to know in case his potassium showed as low.

## 2022-12-12 MED ORDER — POTASSIUM CHLORIDE CRYS ER 10 MEQ PO TBCR
30.0000 meq | EXTENDED_RELEASE_TABLET | Freq: Every day | ORAL | 3 refills | Status: DC
Start: 2022-12-12 — End: 2023-04-14

## 2022-12-12 NOTE — Telephone Encounter (Signed)
Call patient I have sent in potassium chloride for him to take THREE tablets daily as he was taking last month. Please ask if this dose changed.   I didn't refill potassium chloride .   Please make him aware

## 2022-12-12 NOTE — Telephone Encounter (Signed)
Pt is aware and gave a verbal understanding.  

## 2022-12-19 ENCOUNTER — Other Ambulatory Visit: Payer: Self-pay | Admitting: Family

## 2022-12-19 DIAGNOSIS — F411 Generalized anxiety disorder: Secondary | ICD-10-CM

## 2022-12-20 ENCOUNTER — Other Ambulatory Visit: Payer: Self-pay | Admitting: Cardiovascular Disease

## 2022-12-20 DIAGNOSIS — I480 Paroxysmal atrial fibrillation: Secondary | ICD-10-CM

## 2022-12-22 ENCOUNTER — Other Ambulatory Visit (INDEPENDENT_AMBULATORY_CARE_PROVIDER_SITE_OTHER): Payer: Medicare Other

## 2022-12-22 DIAGNOSIS — I10 Essential (primary) hypertension: Secondary | ICD-10-CM

## 2022-12-22 DIAGNOSIS — R899 Unspecified abnormal finding in specimens from other organs, systems and tissues: Secondary | ICD-10-CM | POA: Diagnosis not present

## 2022-12-22 DIAGNOSIS — R7309 Other abnormal glucose: Secondary | ICD-10-CM

## 2022-12-22 DIAGNOSIS — D649 Anemia, unspecified: Secondary | ICD-10-CM | POA: Diagnosis not present

## 2022-12-22 DIAGNOSIS — D509 Iron deficiency anemia, unspecified: Secondary | ICD-10-CM

## 2022-12-22 DIAGNOSIS — F419 Anxiety disorder, unspecified: Secondary | ICD-10-CM | POA: Diagnosis not present

## 2022-12-22 LAB — CBC WITH DIFFERENTIAL/PLATELET
Basophils Absolute: 0.1 10*3/uL (ref 0.0–0.1)
Basophils Relative: 0.9 % (ref 0.0–3.0)
Eosinophils Absolute: 0.8 10*3/uL — ABNORMAL HIGH (ref 0.0–0.7)
Eosinophils Relative: 9.5 % — ABNORMAL HIGH (ref 0.0–5.0)
HCT: 39 % (ref 39.0–52.0)
Hemoglobin: 12.6 g/dL — ABNORMAL LOW (ref 13.0–17.0)
Lymphocytes Relative: 35.4 % (ref 12.0–46.0)
Lymphs Abs: 3 10*3/uL (ref 0.7–4.0)
MCHC: 32.2 g/dL (ref 30.0–36.0)
MCV: 90 fl (ref 78.0–100.0)
Monocytes Absolute: 0.9 10*3/uL (ref 0.1–1.0)
Monocytes Relative: 10.4 % (ref 3.0–12.0)
Neutro Abs: 3.7 10*3/uL (ref 1.4–7.7)
Neutrophils Relative %: 43.8 % (ref 43.0–77.0)
Platelets: 327 10*3/uL (ref 150.0–400.0)
RBC: 4.33 Mil/uL (ref 4.22–5.81)
RDW: 18.9 % — ABNORMAL HIGH (ref 11.5–15.5)
WBC: 8.5 10*3/uL (ref 4.0–10.5)

## 2022-12-22 LAB — IBC + FERRITIN
Ferritin: 8.2 ng/mL — ABNORMAL LOW (ref 22.0–322.0)
Iron: 43 ug/dL (ref 42–165)
Saturation Ratios: 11.4 % — ABNORMAL LOW (ref 20.0–50.0)
TIBC: 376.6 ug/dL (ref 250.0–450.0)
Transferrin: 269 mg/dL (ref 212.0–360.0)

## 2022-12-22 LAB — COMPREHENSIVE METABOLIC PANEL
ALT: 15 U/L (ref 0–53)
AST: 18 U/L (ref 0–37)
Albumin: 3.9 g/dL (ref 3.5–5.2)
Alkaline Phosphatase: 74 U/L (ref 39–117)
BUN: 18 mg/dL (ref 6–23)
CO2: 28 mEq/L (ref 19–32)
Calcium: 9.3 mg/dL (ref 8.4–10.5)
Chloride: 102 mEq/L (ref 96–112)
Creatinine, Ser: 1.24 mg/dL (ref 0.40–1.50)
GFR: 58.68 mL/min — ABNORMAL LOW (ref >60.00)
Glucose, Bld: 103 mg/dL — ABNORMAL HIGH (ref 70–99)
Potassium: 3.8 mEq/L (ref 3.5–5.1)
Sodium: 138 mEq/L (ref 135–145)
Total Bilirubin: 0.4 mg/dL (ref 0.2–1.2)
Total Protein: 7.2 g/dL (ref 6.0–8.3)

## 2022-12-22 LAB — BASIC METABOLIC PANEL
BUN: 18 mg/dL (ref 6–23)
CO2: 28 mEq/L (ref 19–32)
Calcium: 9.3 mg/dL (ref 8.4–10.5)
Chloride: 102 mEq/L (ref 96–112)
Creatinine, Ser: 1.24 mg/dL (ref 0.40–1.50)
GFR: 58.68 mL/min — ABNORMAL LOW (ref >60.00)
Glucose, Bld: 103 mg/dL — ABNORMAL HIGH (ref 70–99)
Potassium: 3.8 mEq/L (ref 3.5–5.1)
Sodium: 138 mEq/L (ref 135–145)

## 2022-12-22 LAB — TSH: TSH: 2.25 u[IU]/mL (ref 0.35–5.50)

## 2022-12-22 LAB — HEMOGLOBIN A1C: Hgb A1c MFr Bld: 6.5 % (ref 4.6–6.5)

## 2022-12-22 NOTE — Telephone Encounter (Signed)
Refill request

## 2022-12-22 NOTE — Telephone Encounter (Signed)
Prescription refill request for Xarelto received.  Indication:afib Last office visit:3/24 Weight:79.7  kg Age:71 Scr:1.37  5/24 CrCl:55.75  ml/min  Prescription refilled

## 2023-01-23 ENCOUNTER — Other Ambulatory Visit (INDEPENDENT_AMBULATORY_CARE_PROVIDER_SITE_OTHER): Payer: Medicare Other

## 2023-01-23 DIAGNOSIS — D509 Iron deficiency anemia, unspecified: Secondary | ICD-10-CM

## 2023-01-26 ENCOUNTER — Telehealth: Payer: Self-pay

## 2023-01-26 NOTE — Telephone Encounter (Signed)
Patient states he called his pharmacy and they told him this is not approved by insurance until 02/05/2023.  Prescription Request  01/26/2023  LOV: Visit date not found  What is the name of the medication or equipment? hydrOXYzine (ATARAX) 10 MG tablet  Have you contacted your pharmacy to request a refill? Yes   Which pharmacy would you like this sent to?  [id number], [place] - [address] [place] [address] Phone: [phone] Fax: [phone]    Patient notified that their request is being sent to the clinical staff for review and that they should receive a response within 2 business days.   Please advise at Mobile [phone] (mobile)  Patient states he has two pills left.

## 2023-01-28 ENCOUNTER — Ambulatory Visit (INDEPENDENT_AMBULATORY_CARE_PROVIDER_SITE_OTHER): Payer: Medicare Other | Admitting: Family

## 2023-01-28 ENCOUNTER — Encounter: Payer: Self-pay | Admitting: Family

## 2023-01-28 VITALS — BP 124/82 | HR 63 | Temp 97.9°F | Ht 66.0 in | Wt 173.4 lb

## 2023-01-28 DIAGNOSIS — F411 Generalized anxiety disorder: Secondary | ICD-10-CM

## 2023-01-28 DIAGNOSIS — E785 Hyperlipidemia, unspecified: Secondary | ICD-10-CM

## 2023-01-28 DIAGNOSIS — R319 Hematuria, unspecified: Secondary | ICD-10-CM

## 2023-01-28 DIAGNOSIS — D509 Iron deficiency anemia, unspecified: Secondary | ICD-10-CM | POA: Diagnosis not present

## 2023-01-28 DIAGNOSIS — Z125 Encounter for screening for malignant neoplasm of prostate: Secondary | ICD-10-CM | POA: Diagnosis not present

## 2023-01-28 MED ORDER — HYDROXYZINE HCL 10 MG PO TABS
10.0000 mg | ORAL_TABLET | Freq: Two times a day (BID) | ORAL | 2 refills | Status: DC | PRN
Start: 2023-01-28 — End: 2023-08-03

## 2023-01-28 NOTE — Assessment & Plan Note (Signed)
Improvement.  Patient discussed increasing atarax dose if needed.  Patient may increase atarax to 10 to 20 mg twice daily PRN

## 2023-01-28 NOTE — Progress Notes (Signed)
Assessment & Plan:  Screening for prostate cancer -     PSA; Future  GAD (generalized anxiety disorder) Assessment & Plan: Improvement.  Patient discussed increasing atarax dose if needed.  Patient may increase atarax to 10 to 20 mg twice daily PRN  Orders: -     hydrOXYzine HCl; Take 1-2 tablets (10-20 mg total) by mouth 2 (two) times daily as needed for anxiety.  Dispense: 120 tablet; Refill: 2  Hyperlipidemia, unspecified hyperlipidemia type Assessment & Plan: Pending lipid panel.  Continue Lipitor 80 mg.   Orders: -     Lipid panel; Future -     Comprehensive metabolic panel; Future  Iron deficiency anemia, unspecified iron deficiency anemia type Assessment & Plan: Normocytic anemia.  Pending CBC with differential.  Advised to continue ferrous sulfate 325 mg daily.  Concern is for the increased recalls constipation.  History of internal hemorrhoids, hematuria (following with urology).  Orders: -     CBC with Differential/Platelet; Future  Hematuria, unspecified type -     PSA; Future     Return precautions given.   Risks, benefits, and alternatives of the medications and treatment plan prescribed today were discussed, and patient expressed understanding.   Education regarding symptom management and diagnosis given to patient on AVS either electronically or printed.  Return in about 3 months (around 04/30/2023) for Fasting labs in 2-3 weeks.  [name], FNP  Subjective:    Patient ID: Dalton Sanders, male    DOB: 02/21/1951, 72 y.o.   MRN: [id number]  CC: Dalton Sanders is a 72 y.o. male who presents today for follow up normocytic anemia, eosinophilia  HPI: Endorses increased stress as his daughter is currently hospitalized.  He is very worried about her.  He has noticed improvement with anxiety while taking Atarax 10 mg twice daily.  He feels that his stress will likely increased due to worry over his daughter .   he has been compliant with taking  ferrous sulfate 325 mg once daily.  Denies hematuria, rectal bleeding.  He is also taking Colace prevent constipation.   Last seen by cardiology three 2220.4 paroxysmal atrial fibrillation.  Continued on anticoagulation with Xarelto.  Colonoscopy Dr Norma Fredrickson 01/02/2020, nonbleeding internal hemorrhoids, diverticulosis, polyp removed.  Repeat in 5 years Does not appear that he had ECG  No urinary hesitancy, decreased stream.  He would like to screen annually for PSA.   He continues to follow with urology due to chronic hematuria.  Allergies: Corticosteroids, Iodides, Other, Oysters [shellfish allergy], and Cortisone Current Outpatient Medications on File Prior to Visit  Medication Sig Dispense Refill   atorvastatin (LIPITOR) 80 MG tablet Take 1 tablet (80 mg total) by mouth daily. 90 tablet 3   Cholecalciferol (VITAMIN D3) 125 MCG (5000 UT) CAPS Take 1 capsule by mouth daily.     Coenzyme Q10 (CO Q 10 PO) Take 1 tablet by mouth daily.     diltiazem (CARDIZEM CD) 240 MG 24 hr capsule TAKE 1 CAPSULE BY MOUTH EVERY DAY (MD NOTED TO FILL FOR 4 DAY SUPPLY FIRST) 90 capsule 3   diltiazem (CARDIZEM) 30 MG tablet TAKE 1 TABLET (30 MG TOTAL) BY MOUTH AS NEEDED. 90 tablet 3   flecainide (TAMBOCOR) 50 MG tablet TAKE 1 TABLET BY MOUTH TWICE A DAY 180 tablet 2   hydrochlorothiazide (HYDRODIURIL) 25 MG tablet Take 1 tablet (25 mg total) by mouth daily. 90 tablet 3   levalbuterol (XOPENEX HFA) 45 MCG/ACT inhaler Inhale 2 puffs into  the lungs 3 (three) times daily. 1 each 12   MAGNESIUM PO Take 1 capsule by mouth daily.     Multiple Vitamin (ONE-A-DAY MENS PO) Take 1 tablet by mouth daily.      pantoprazole (PROTONIX) 40 MG tablet Take 40 mg by mouth daily.     potassium chloride (KLOR-CON M) 10 MEQ tablet Take 3 tablets (30 mEq total) by mouth daily. 90 tablet 3   Saw Palmetto, Serenoa repens, (SAW PALMETTO PO) Take 40 mg by mouth daily.      triamcinolone (KENALOG) 0.1 % Apply 1 application topically  2 (two) times daily as needed.     VITAMIN E PO Take 1 capsule by mouth.     XARELTO 20 MG TABS tablet TAKE 1 TABLET BY MOUTH DAILY WITH SUPPER 90 tablet 1   No current facility-administered medications on file prior to visit.    Review of Systems  Constitutional:  Negative for chills and fever.  Respiratory:  Negative for cough.   Cardiovascular:  Negative for chest pain and palpitations.  Gastrointestinal:  Negative for nausea and vomiting.  Psychiatric/Behavioral:  The patient is nervous/anxious.       Objective:    BP 124/82   Pulse 63   Temp 97.9 F (36.6 C) (Oral)   Ht 5\' 6"  (1.676 m)   Wt 173 lb 6.4 oz (78.7 kg)   SpO2 97%   BMI 27.99 kg/m  BP Readings from Last 3 Encounters:  01/28/23 124/82  11/21/22 136/80  11/19/22 136/77   Wt Readings from Last 3 Encounters:  01/28/23 173 lb 6.4 oz (78.7 kg)  11/21/22 175 lb 12.8 oz (79.7 kg)  11/19/22 173 lb (78.5 kg)    Physical Exam Vitals reviewed.  Constitutional:      Appearance: He is well-developed.  Cardiovascular:     Rate and Rhythm: Regular rhythm.     Heart sounds: Normal heart sounds.  Pulmonary:     Effort: Pulmonary effort is normal. No respiratory distress.     Breath sounds: Normal breath sounds. No wheezing, rhonchi or rales.  Skin:    General: Skin is warm and dry.  Neurological:     Mental Status: He is alert.  Psychiatric:        Speech: Speech normal.        Behavior: Behavior normal.

## 2023-01-28 NOTE — Assessment & Plan Note (Signed)
Normocytic anemia.  Pending CBC with differential.  Advised to continue ferrous sulfate 325 mg daily.  Concern is for the increased recalls constipation.  History of internal hemorrhoids, hematuria (following with urology).

## 2023-01-28 NOTE — Assessment & Plan Note (Signed)
Pending lipid panel.  Continue Lipitor 80 mg.

## 2023-02-10 ENCOUNTER — Other Ambulatory Visit: Payer: Self-pay | Admitting: Cardiovascular Disease

## 2023-02-10 MED ORDER — DILTIAZEM HCL ER COATED BEADS 240 MG PO CP24: ORAL_CAPSULE | ORAL | 2 refills | Status: DC

## 2023-02-13 ENCOUNTER — Other Ambulatory Visit (INDEPENDENT_AMBULATORY_CARE_PROVIDER_SITE_OTHER): Payer: Medicare Other

## 2023-02-13 DIAGNOSIS — R319 Hematuria, unspecified: Secondary | ICD-10-CM | POA: Diagnosis not present

## 2023-02-13 DIAGNOSIS — E785 Hyperlipidemia, unspecified: Secondary | ICD-10-CM

## 2023-02-13 DIAGNOSIS — I10 Essential (primary) hypertension: Secondary | ICD-10-CM

## 2023-02-13 DIAGNOSIS — D509 Iron deficiency anemia, unspecified: Secondary | ICD-10-CM | POA: Diagnosis not present

## 2023-02-13 DIAGNOSIS — Z125 Encounter for screening for malignant neoplasm of prostate: Secondary | ICD-10-CM

## 2023-02-13 LAB — CBC WITH DIFFERENTIAL/PLATELET
Basophils Absolute: 0.1 10*3/uL (ref 0.0–0.1)
Basophils Relative: 1.2 % (ref 0.0–3.0)
Eosinophils Absolute: 0.5 10*3/uL (ref 0.0–0.7)
Eosinophils Relative: 7.6 % — ABNORMAL HIGH (ref 0.0–5.0)
HCT: 41.5 % (ref 39.0–52.0)
Hemoglobin: 13.4 g/dL (ref 13.0–17.0)
Lymphocytes Relative: 41.2 % (ref 12.0–46.0)
Lymphs Abs: 2.9 10*3/uL (ref 0.7–4.0)
MCHC: 32.3 g/dL (ref 30.0–36.0)
MCV: 93.9 fl (ref 78.0–100.0)
Monocytes Absolute: 0.6 10*3/uL (ref 0.1–1.0)
Monocytes Relative: 9 % (ref 3.0–12.0)
Neutro Abs: 2.9 10*3/uL (ref 1.4–7.7)
Neutrophils Relative %: 41 % — ABNORMAL LOW (ref 43.0–77.0)
Platelets: 280 10*3/uL (ref 150.0–400.0)
RBC: 4.42 Mil/uL (ref 4.22–5.81)
RDW: 17.4 % — ABNORMAL HIGH (ref 11.5–15.5)
WBC: 7 10*3/uL (ref 4.0–10.5)

## 2023-02-13 LAB — LIPID PANEL
Cholesterol: 168 mg/dL (ref 0–200)
HDL: 45 mg/dL (ref >39.00)
LDL Cholesterol: 91 mg/dL (ref 0–99)
NonHDL: 122.8
Total CHOL/HDL Ratio: 4
Triglycerides: 158 mg/dL — ABNORMAL HIGH (ref 0.0–149.0)
VLDL: 31.6 mg/dL (ref 0.0–40.0)

## 2023-02-13 LAB — PSA: PSA: 3.78 ng/mL (ref 0.10–4.00)

## 2023-02-13 LAB — COMPREHENSIVE METABOLIC PANEL
ALT: 15 U/L (ref 0–53)
AST: 17 U/L (ref 0–37)
Albumin: 4 g/dL (ref 3.5–5.2)
Alkaline Phosphatase: 70 U/L (ref 39–117)
BUN: 16 mg/dL (ref 6–23)
CO2: 29 mEq/L (ref 19–32)
Calcium: 9.3 mg/dL (ref 8.4–10.5)
Chloride: 102 mEq/L (ref 96–112)
Creatinine, Ser: 1.26 mg/dL (ref 0.40–1.50)
GFR: 57.51 mL/min — ABNORMAL LOW (ref >60.00)
Glucose, Bld: 98 mg/dL (ref 70–99)
Potassium: 4.1 mEq/L (ref 3.5–5.1)
Sodium: 140 mEq/L (ref 135–145)
Total Bilirubin: 0.6 mg/dL (ref 0.2–1.2)
Total Protein: 6.9 g/dL (ref 6.0–8.3)

## 2023-02-26 NOTE — Addendum Note (Signed)
Addended by: [place], Aynslee Mulhall on: 02/26/2023 10:47 AM   Modules accepted: Orders

## 2023-03-09 ENCOUNTER — Other Ambulatory Visit: Payer: Medicare Other

## 2023-03-09 DIAGNOSIS — L821 Other seborrheic keratosis: Secondary | ICD-10-CM | POA: Diagnosis not present

## 2023-03-09 DIAGNOSIS — D2272 Melanocytic nevi of left lower limb, including hip: Secondary | ICD-10-CM | POA: Diagnosis not present

## 2023-03-09 DIAGNOSIS — D2271 Melanocytic nevi of right lower limb, including hip: Secondary | ICD-10-CM | POA: Diagnosis not present

## 2023-03-09 DIAGNOSIS — Z08 Encounter for follow-up examination after completed treatment for malignant neoplasm: Secondary | ICD-10-CM | POA: Diagnosis not present

## 2023-03-09 DIAGNOSIS — Z85828 Personal history of other malignant neoplasm of skin: Secondary | ICD-10-CM | POA: Diagnosis not present

## 2023-03-09 DIAGNOSIS — D2261 Melanocytic nevi of right upper limb, including shoulder: Secondary | ICD-10-CM | POA: Diagnosis not present

## 2023-03-09 DIAGNOSIS — D225 Melanocytic nevi of trunk: Secondary | ICD-10-CM | POA: Diagnosis not present

## 2023-03-09 DIAGNOSIS — D2262 Melanocytic nevi of left upper limb, including shoulder: Secondary | ICD-10-CM | POA: Diagnosis not present

## 2023-03-09 DIAGNOSIS — L570 Actinic keratosis: Secondary | ICD-10-CM | POA: Diagnosis not present

## 2023-03-12 ENCOUNTER — Ambulatory Visit: Payer: Medicare Other | Admitting: Urology

## 2023-03-17 ENCOUNTER — Encounter: Payer: Self-pay | Admitting: Cardiovascular Disease

## 2023-03-17 ENCOUNTER — Ambulatory Visit: Payer: Medicare Other | Attending: Cardiovascular Disease | Admitting: Cardiovascular Disease

## 2023-03-17 VITALS — BP 142/78 | HR 68 | Ht 66.0 in | Wt 178.0 lb

## 2023-03-17 DIAGNOSIS — I480 Paroxysmal atrial fibrillation: Secondary | ICD-10-CM | POA: Insufficient documentation

## 2023-03-17 DIAGNOSIS — E785 Hyperlipidemia, unspecified: Secondary | ICD-10-CM | POA: Insufficient documentation

## 2023-03-17 DIAGNOSIS — I6523 Occlusion and stenosis of bilateral carotid arteries: Secondary | ICD-10-CM | POA: Diagnosis not present

## 2023-03-17 DIAGNOSIS — I779 Disorder of arteries and arterioles, unspecified: Secondary | ICD-10-CM | POA: Insufficient documentation

## 2023-03-17 DIAGNOSIS — I10 Essential (primary) hypertension: Secondary | ICD-10-CM | POA: Diagnosis not present
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
## 2023-03-17 NOTE — Patient Instructions (Signed)
Medication Instructions:  No changes *If you need a refill on your cardiac medications before your next appointment, please call your pharmacy*   Lab Work: None ordered If you have labs (blood work) drawn today and your tests are completely normal, you will receive your results only by: MyChart Message (if you have MyChart) OR A paper copy in the mail If you have any lab test that is abnormal or we need to change your treatment, we will call you to review the results.   Testing/Procedures: None ordered   Follow-Up: At [facility], you and your health needs are our priority.  As part of our continuing mission to provide you with exceptional heart care, we have created designated Provider Care Teams.  These Care Teams include your primary Cardiologist (physician) and Advanced Practice Providers (APPs -  Physician Assistants and Nurse Practitioners) who all work together to provide you with the care you need, when you need it.  We recommend signing up for the patient portal called "MyChart".  Sign up information is provided on this After Visit Summary.  MyChart is used to connect with patients for Virtual Visits (Telemedicine).  Patients are able to view lab/test results, encounter notes, upcoming appointments, etc.  Non-urgent messages can be sent to your provider as well.   To learn more about what you can do with MyChart, go to [email or web address].    Your next appointment:   6 month(s)  Provider:   You may see Lorine Bears, MD or one of the following Advanced Practice Providers on your designated Care Team:   Nicolasa Ducking, NP Eula Listen, PA-C Cadence Fransico Michael, PA-C Charlsie Quest, NP

## 2023-03-18 ENCOUNTER — Ambulatory Visit: Payer: Medicare Other | Admitting: Urology

## 2023-03-18 ENCOUNTER — Encounter: Payer: Self-pay | Admitting: Urology

## 2023-03-18 ENCOUNTER — Other Ambulatory Visit: Payer: Self-pay | Admitting: *Deleted

## 2023-03-18 VITALS — BP 139/77 | HR 63 | Ht 66.0 in | Wt 179.0 lb

## 2023-03-18 DIAGNOSIS — R350 Frequency of micturition: Secondary | ICD-10-CM

## 2023-03-18 DIAGNOSIS — R3121 Asymptomatic microscopic hematuria: Secondary | ICD-10-CM

## 2023-03-18 DIAGNOSIS — Z125 Encounter for screening for malignant neoplasm of prostate: Secondary | ICD-10-CM

## 2023-03-18 NOTE — Patient Instructions (Signed)

## 2023-03-18 NOTE — Progress Notes (Signed)
03/18/2023 9:53 AM   Dalton Sanders February 28, 1951 [id number]  Reason for visit: Follow up PSA screening, asymptomatic microscopic hematuria  HPI: 72 year old male with long history of asymptomatic microscopic hematuria with 3-6 RBC on urine samples that have been chronic as far back as 2015.  He had a renal/bladder ultrasound in June 2023 for CKD which was benign, but he has deferred any further workup including CT or cystoscopy.  Using shared decision making, he is amenable to considering further workup if degree of microscopic hematuria increased.  Urinalysis today is pending.  He has also been followed for routine PSA screening.  PSA has been normal with baseline value of approximately 2.0, including 2.13 in September 2021, 2.48 in March 2023, and 3.78 in August 2024.  We have previously reviewed the risks and benefits of PSA screening extensively, and that routine screening in men over age 26 is not recommended.  No prior cross-sectional imaging to evaluate prostate volume.  We reviewed at length possible causes of fluctuation in the PSA including BPH, recent ejaculation, infection, inflammation.  Using shared decision making, he was interested in a repeat PSA with reflex to free in 6 months.  Call with urinalysis results Repeat PSA with reflex to free in 6 months  Sondra Come, MD  [facility] [address], [address] [place], [place] [address] [phone]

## 2023-03-25 ENCOUNTER — Other Ambulatory Visit: Payer: Self-pay | Admitting: Cardiovascular Disease

## 2023-04-13 ENCOUNTER — Other Ambulatory Visit: Payer: Self-pay | Admitting: Family

## 2023-04-13 DIAGNOSIS — I10 Essential (primary) hypertension: Secondary | ICD-10-CM

## 2023-04-30 ENCOUNTER — Ambulatory Visit: Payer: Medicare Other | Admitting: Family

## 2023-05-06 NOTE — Telephone Encounter (Signed)
Error

## 2023-05-12 ENCOUNTER — Ambulatory Visit (INDEPENDENT_AMBULATORY_CARE_PROVIDER_SITE_OTHER): Payer: Medicare Other | Admitting: *Deleted

## 2023-05-12 VITALS — BP 120/74 | HR 61 | Ht 66.0 in | Wt 180.0 lb

## 2023-05-12 DIAGNOSIS — Z0000 Encounter for general adult medical examination without abnormal findings: Secondary | ICD-10-CM
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
## 2023-05-12 NOTE — Patient Instructions (Signed)
Mr. Brar , Thank you for taking time to come for your Medicare Wellness Visit. I appreciate your ongoing commitment to your health goals. Please review the following plan we discussed and let me know if I can assist you in the future.   Referrals/Orders/Follow-Ups/Clinician Recommendations: Consider updating your vaccines  This is a list of the screening recommended for you and due dates:  Health Maintenance  Topic Date Due   Zoster (Shingles) Vaccine (1 of 2) Never done   Pneumonia Vaccine (1 of 1 - PCV) Never done   Flu Shot  09/21/2023*   Medicare Annual Wellness Visit  05/11/2024   Colon Cancer Screening  01/29/2025   DTaP/Tdap/Td vaccine (2 - Td or Tdap) 07/03/2025   Hepatitis C Screening  Completed   HPV Vaccine  Aged Out   COVID-19 Vaccine  Discontinued  *Topic was postponed. The date shown is not the original due date.    Advanced directives: (Declined) Advance directive discussed with you today. Even though you declined this today, please call our office should you change your mind, and we can give you the proper paperwork for you to fill out.  Next Medicare Annual Wellness Visit scheduled for next year: Yes 05/13/24 @ 11:30

## 2023-05-13 ENCOUNTER — Encounter: Payer: Self-pay | Admitting: Family

## 2023-05-13 ENCOUNTER — Ambulatory Visit: Payer: Medicare Other | Admitting: Family

## 2023-05-13 VITALS — BP 136/80 | HR 75 | Temp 97.7°F | Ht 66.0 in | Wt 179.0 lb

## 2023-05-13 DIAGNOSIS — I10 Essential (primary) hypertension: Secondary | ICD-10-CM

## 2023-05-13 DIAGNOSIS — F411 Generalized anxiety disorder: Secondary | ICD-10-CM

## 2023-05-13 DIAGNOSIS — R7309 Other abnormal glucose: Secondary | ICD-10-CM

## 2023-05-13 LAB — CBC WITH DIFFERENTIAL/PLATELET
Basophils Absolute: 0.1 10*3/uL (ref 0.0–0.1)
Basophils Relative: 0.7 % (ref 0.0–3.0)
Eosinophils Absolute: 0.5 10*3/uL (ref 0.0–0.7)
Eosinophils Relative: 5.8 % — ABNORMAL HIGH (ref 0.0–5.0)
HCT: 42.6 % (ref 39.0–52.0)
Hemoglobin: 14.4 g/dL (ref 13.0–17.0)
Lymphocytes Relative: 39.1 % (ref 12.0–46.0)
Lymphs Abs: 3.3 10*3/uL (ref 0.7–4.0)
MCHC: 33.9 g/dL (ref 30.0–36.0)
MCV: 94.2 fL (ref 78.0–100.0)
Monocytes Absolute: 0.8 10*3/uL (ref 0.1–1.0)
Monocytes Relative: 9.2 % (ref 3.0–12.0)
Neutro Abs: 3.9 10*3/uL (ref 1.4–7.7)
Neutrophils Relative %: 45.2 % (ref 43.0–77.0)
Platelets: 286 10*3/uL (ref 150.0–400.0)
RBC: 4.53 Mil/uL (ref 4.22–5.81)
RDW: 13.9 % (ref 11.5–15.5)
WBC: 8.5 10*3/uL (ref 4.0–10.5)

## 2023-05-13 LAB — COMPREHENSIVE METABOLIC PANEL
ALT: 22 U/L (ref 0–53)
AST: 20 U/L (ref 0–37)
Albumin: 4.4 g/dL (ref 3.5–5.2)
Alkaline Phosphatase: 79 U/L (ref 39–117)
BUN: 23 mg/dL (ref 6–23)
CO2: 31 meq/L (ref 19–32)
Calcium: 9.8 mg/dL (ref 8.4–10.5)
Chloride: 98 meq/L (ref 96–112)
Creatinine, Ser: 1.25 mg/dL (ref 0.40–1.50)
GFR: 57.96 mL/min — ABNORMAL LOW (ref >60.00)
Glucose, Bld: 111 mg/dL — ABNORMAL HIGH (ref 70–99)
Potassium: 3.3 meq/L — ABNORMAL LOW (ref 3.5–5.1)
Sodium: 139 meq/L (ref 135–145)
Total Bilirubin: 0.7 mg/dL (ref 0.2–1.2)
Total Protein: 7.3 g/dL (ref 6.0–8.3)

## 2023-05-13 LAB — HEMOGLOBIN A1C: Hgb A1c MFr Bld: 6.3 % (ref 4.6–6.5)

## 2023-05-13 NOTE — Progress Notes (Unsigned)
Assessment & Plan:  There are no diagnoses linked to this encounter.   Return precautions given.   Risks, benefits, and alternatives of the medications and treatment plan prescribed today were discussed, and patient expressed understanding.   Education regarding symptom management and diagnosis given to patient on AVS either electronically or printed.  No follow-ups on file.  Dalton Plowman, FNP  Subjective:    Patient ID: Dalton Sanders, male    DOB: 1950/12/09, 72 y.o.   MRN: [id number]  CC: Dalton Sanders is a 72 y.o. male who presents today for follow up.   HPI: He is taking atarax 10mg  BID He feels anxiety has improved.      Consult with urology 03/18/23 Plan to monitor PSA in 6 months.   Allergies: Corticosteroids, Iodides, Other, Oysters [shellfish allergy], and Cortisone Current Outpatient Medications on File Prior to Visit  Medication Sig Dispense Refill   atorvastatin (LIPITOR) 80 MG tablet Take 1 tablet (80 mg total) by mouth daily. 90 tablet 3   Cholecalciferol (VITAMIN D3) 125 MCG (5000 UT) CAPS Take 1 capsule by mouth daily.     Coenzyme Q10 (CO Q 10 PO) Take 1 tablet by mouth daily.     diltiazem (CARDIZEM CD) 240 MG 24 hr capsule TAKE 1 CAPSULE BY MOUTH EVERY DAY (MD NOTED TO FILL FOR 4 DAY SUPPLY FIRST) 90 capsule 2   diltiazem (CARDIZEM) 30 MG tablet TAKE 1 TABLET (30 MG TOTAL) BY MOUTH AS NEEDED 90 tablet 2   flecainide (TAMBOCOR) 50 MG tablet TAKE 1 TABLET BY MOUTH TWICE A DAY 180 tablet 2   hydrochlorothiazide (HYDRODIURIL) 25 MG tablet Take 1 tablet (25 mg total) by mouth daily. 90 tablet 3   hydrOXYzine (ATARAX) 10 MG tablet Take 1-2 tablets (10-20 mg total) by mouth 2 (two) times daily as needed for anxiety. 120 tablet 2   KLOR-CON M10 10 MEQ tablet TAKE 3 TABLETS BY MOUTH DAILY. 90 tablet 3   levalbuterol (XOPENEX HFA) 45 MCG/ACT inhaler Inhale 2 puffs into the lungs 3 (three) times daily. 1 each 12   MAGNESIUM PO Take 1 capsule by mouth  daily.     Multiple Vitamin (ONE-A-DAY MENS PO) Take 1 tablet by mouth daily.      pantoprazole (PROTONIX) 40 MG tablet Take 40 mg by mouth daily.     Saw Palmetto, Serenoa repens, (SAW PALMETTO PO) Take 40 mg by mouth daily.      triamcinolone (KENALOG) 0.1 % Apply 1 application topically 2 (two) times daily as needed.     VITAMIN E PO Take 1 capsule by mouth.     XARELTO 20 MG TABS tablet TAKE 1 TABLET BY MOUTH DAILY WITH SUPPER 90 tablet 1   No current facility-administered medications on file prior to visit.    Review of Systems    Objective:    BP 136/80   Pulse 75   Temp 97.7 F (36.5 C) (Oral)   Ht 5\' 6"  (1.676 m)   Wt 179 lb (81.2 kg)   SpO2 97%   BMI 28.89 kg/m  BP Readings from Last 3 Encounters:  05/13/23 136/80  05/12/23 120/74  03/18/23 139/77   Wt Readings from Last 3 Encounters:  05/13/23 179 lb (81.2 kg)  05/12/23 180 lb (81.6 kg)  03/18/23 179 lb (81.2 kg)      05/13/2023    9:15 AM 05/12/2023    9:54 AM 01/28/2023   10:09 AM  Depression screen PHQ 2/9  Decreased  Interest 0 0 0  Down, Depressed, Hopeless 0 0 0  PHQ - 2 Score 0 0 0  Altered sleeping 0 0   Tired, decreased energy 0 0   Change in appetite 0 0   Feeling bad or failure about yourself  0 0   Trouble concentrating 0 0   Moving slowly or fidgety/restless 0 0   Suicidal thoughts 0 0   PHQ-9 Score 0 0   Difficult doing work/chores Not difficult at all Not difficult at all      Physical Exam

## 2023-05-14 NOTE — Assessment & Plan Note (Signed)
Improved.  Continue Atarax 10 to 20 mg twice daily

## 2023-05-14 NOTE — Assessment & Plan Note (Signed)
Chronic, stable . Continue hydrochlorothiazide 25 mg daily, potassium chloride 10 mEq 3 tablets daily.

## 2023-05-15 ENCOUNTER — Other Ambulatory Visit: Payer: Self-pay | Admitting: *Deleted

## 2023-05-15 ENCOUNTER — Encounter: Payer: Self-pay | Admitting: *Deleted

## 2023-05-15 DIAGNOSIS — I10 Essential (primary) hypertension: Secondary | ICD-10-CM

## 2023-05-28 ENCOUNTER — Other Ambulatory Visit: Payer: Medicare Other

## 2023-05-28 DIAGNOSIS — I10 Essential (primary) hypertension: Secondary | ICD-10-CM | POA: Diagnosis not present

## 2023-05-28 LAB — BASIC METABOLIC PANEL
BUN: 23 mg/dL (ref 6–23)
CO2: 32 meq/L (ref 19–32)
Calcium: 9.3 mg/dL (ref 8.4–10.5)
Chloride: 99 meq/L (ref 96–112)
Creatinine, Ser: 1.38 mg/dL (ref 0.40–1.50)
GFR: 51.46 mL/min — ABNORMAL LOW (ref >60.00)
Glucose, Bld: 104 mg/dL — ABNORMAL HIGH (ref 70–99)
Potassium: 3.7 meq/L (ref 3.5–5.1)
Sodium: 138 meq/L (ref 135–145)

## 2023-05-28 LAB — CBC WITH DIFFERENTIAL/PLATELET
Basophils Absolute: 0.1 10*3/uL (ref 0.0–0.1)
Basophils Relative: 0.5 % (ref 0.0–3.0)
Eosinophils Absolute: 0.5 10*3/uL (ref 0.0–0.7)
Eosinophils Relative: 5.1 % — ABNORMAL HIGH (ref 0.0–5.0)
HCT: 38.3 % — ABNORMAL LOW (ref 39.0–52.0)
Hemoglobin: 13.6 g/dL (ref 13.0–17.0)
Lymphocytes Relative: 36.1 % (ref 12.0–46.0)
Lymphs Abs: 3.7 10*3/uL (ref 0.7–4.0)
MCHC: 35.4 g/dL (ref 30.0–36.0)
MCV: 95.5 fL (ref 78.0–100.0)
Monocytes Absolute: 1.1 10*3/uL — ABNORMAL HIGH (ref 0.1–1.0)
Monocytes Relative: 11.1 % (ref 3.0–12.0)
Neutro Abs: 4.8 10*3/uL (ref 1.4–7.7)
Neutrophils Relative %: 47.2 % (ref 43.0–77.0)
Platelets: 248 10*3/uL (ref 150.0–400.0)
RBC: 4.01 Mil/uL — ABNORMAL LOW (ref 4.22–5.81)
RDW: 13.8 % (ref 11.5–15.5)
WBC: 10.2 10*3/uL (ref 4.0–10.5)

## 2023-06-02 ENCOUNTER — Telehealth: Payer: Self-pay

## 2023-06-02 NOTE — Telephone Encounter (Signed)
Patient states he has been putting off a hip replacement for a year.  Patient states he "overdid it" recently and his hip has been excruciating ever since.  Patient states he would like to know, if [name], FNP, were going to have a hip replacement, he would like to know which doctor she would choose.

## 2023-06-03 NOTE — Telephone Encounter (Signed)
Spoke to pt and informed him that per Claris Che;  I have had patients see Dr Nyoka Lint [facility] whom he has seen in the past;  There is also Dr Francesco Sor with [facility] has been in practice a long time . Pt stated that he was told that it takes about 7-8 mnths to see Dr Sunny Schlein, and Dr Neomia Glass told him that he does not do that type of procedure. He will speak to someone [employer] that he knows and let us know after that he will probably need a referral

## 2023-06-03 NOTE — Telephone Encounter (Signed)
noted 

## 2023-06-14 ENCOUNTER — Other Ambulatory Visit: Payer: Self-pay | Admitting: Family

## 2023-06-14 DIAGNOSIS — F411 Generalized anxiety disorder: Secondary | ICD-10-CM

## 2023-06-19 ENCOUNTER — Other Ambulatory Visit: Payer: Self-pay | Admitting: Cardiovascular Disease

## 2023-06-19 DIAGNOSIS — I480 Paroxysmal atrial fibrillation: Secondary | ICD-10-CM

## 2023-06-19 NOTE — Telephone Encounter (Signed)
Prescription refill request for Xarelto received.  Indication: Afib  Last office visit: 03/17/23 Kirke Corin)  Weight: 81.2kg Age: 72 Scr: 1.38 (05/28/23)  CrCl: 56.22ml/min  Appropriate dose. Refill sent.

## 2023-07-15 DIAGNOSIS — M169 Osteoarthritis of hip, unspecified: Secondary | ICD-10-CM | POA: Diagnosis not present

## 2023-07-15 DIAGNOSIS — M1611 Unilateral primary osteoarthritis, right hip: Secondary | ICD-10-CM | POA: Diagnosis not present

## 2023-07-16 ENCOUNTER — Telehealth: Payer: Self-pay | Admitting: Cardiovascular Disease

## 2023-07-16 NOTE — Telephone Encounter (Signed)
Pharmacy please advise on holding Xarelto prior to right THR scheduled for 08/27/2023. Thank you.

## 2023-07-16 NOTE — Telephone Encounter (Signed)
   Pre-operative Risk Assessment    Patient Name: Dalton Sanders  DOB: 07/30/1950 MRN: [id number]   Date of last office visit: 03/17/23 Date of next office visit: 09/16/23   Request for Surgical Clearance    Procedure:   Right THR  Date of Surgery:  Clearance 08/27/23                                Surgeon:  Dr Barbette Merino Group or Practice Name:  [facility] Phone number:  [phone] Fax number:  [phone]   Type of Clearance Requested:   - Pharmacy:  Hold Rivaroxaban (Xarelto) instructions   Type of Anesthesia:  Not Indicated   Additional requests/questions:    Courtney Heys   07/16/2023, 11:26 AM

## 2023-07-17 NOTE — Telephone Encounter (Signed)
Patient with diagnosis of afib on Xarelto for anticoagulation.    Procedure: R- THR Date of procedure: 08/27/2023   CHA2DS2-VASc Score = 3   This indicates a 3.2% annual risk of stroke. The patient's score is based upon: CHF History: 0 HTN History: 1 Diabetes History: 0 Stroke History: 0 Vascular Disease History: 1 Age Score: 1 Gender Score: 0    CrCl 58 mL/min Platelet count 248 K  Per office protocol, patient can hold Xarelto for 3 days prior to procedure.     **This guidance is not considered finalized until pre-operative APP has relayed final recommendations.**

## 2023-07-17 NOTE — Telephone Encounter (Signed)
   Patient Name: Dalton Sanders  DOB: 1950/07/07 MRN: [id number]  Primary Cardiologist: Lorine Bears, MD  Clinical pharmacists have reviewed the patient's past medical history, labs, and current medications as part of preoperative protocol coverage. The following recommendations have been made:  Patient with diagnosis of afib on Xarelto for anticoagulation.     Procedure: R- THR Date of procedure: 08/27/2023    CHA2DS2-VASc Score = 3  This indicates a 3.2% annual risk of stroke. The patient's score is based upon: CHF History: 0 HTN History: 1 Diabetes History: 0 Stroke History: 0 Vascular Disease History: 1 Age Score: 1 Gender Score: 0     CrCl 58 mL/min Platelet count 248 K   Per office protocol, patient can hold Xarelto for 3 days prior to procedure.  Please resume Dalton Sanders as soon as possible postprocedure, at the discretion of the surgeon.   I will route this recommendation to the requesting party via Epic fax function and remove from pre-op pool.  Please call with questions.  Joylene Grapes, NP 07/17/2023, 7:52 AM

## 2023-07-31 ENCOUNTER — Telehealth: Payer: Self-pay

## 2023-07-31 DIAGNOSIS — Z01818 Encounter for other preprocedural examination: Secondary | ICD-10-CM

## 2023-07-31 DIAGNOSIS — R7303 Prediabetes: Secondary | ICD-10-CM

## 2023-07-31 DIAGNOSIS — I480 Paroxysmal atrial fibrillation: Secondary | ICD-10-CM

## 2023-07-31 NOTE — Telephone Encounter (Signed)
 Copied from CRM [id number]. Topic: Appointments - Scheduling Inquiry for Clinic >> Jul 31, 2023 10:44 AM Joanell B wrote: Pt stated that he was requested to schedule a Lab by his PCP and wanted to know if he would need to fast prior to the appointment?  I spoke with patient who states he is going to have a hip replacement on 08/27/2023 and his pre-op is on 08/14/2023.  Patient states [facility] in [facility], stated they were going to contact us  to let us  which labs he needs to have drawn.  I do not see lab orders in our system yet so I let patient know that I will send this message to Rollene Northern, FNP's nurse.

## 2023-08-03 ENCOUNTER — Other Ambulatory Visit: Payer: Self-pay | Admitting: Cardiovascular Disease

## 2023-08-03 ENCOUNTER — Other Ambulatory Visit: Payer: Self-pay | Admitting: Family

## 2023-08-03 DIAGNOSIS — F411 Generalized anxiety disorder: Secondary | ICD-10-CM

## 2023-08-04 NOTE — Telephone Encounter (Signed)
Call patient I have ordered nonfasting labs.    He is not scheduled for preop visit.  If he requires a preop visit to proceed with orthopedic surgery, please schedule .  Asked patient if there is any paperwork that needs to be completed and to have faxed to our office as well  please schedule lab and appointment with me in the next couple weeks.

## 2023-08-04 NOTE — Addendum Note (Signed)
Addended by: Allegra Grana on: 08/04/2023 12:07 PM   Modules accepted: Orders

## 2023-08-04 NOTE — Telephone Encounter (Signed)
Spoke to pt he is going to call and see if there is paperwork that we need from them to do preop visit he will call me back and schedule preop ov and nonfasting labs as well

## 2023-08-05 ENCOUNTER — Telehealth: Payer: Self-pay

## 2023-08-05 NOTE — Telephone Encounter (Signed)
Copied from CRM [id number]. Topic: General - Other >> Aug 05, 2023 10:16 AM Kathryne Eriksson wrote: Reason for CRM: Requesting A Call Back

## 2023-08-05 NOTE — Addendum Note (Signed)
Addended by: [place], Aveline Daus on: 08/05/2023 02:29 PM   Modules accepted: Orders

## 2023-08-05 NOTE — Telephone Encounter (Signed)
NOTED

## 2023-08-05 NOTE — Telephone Encounter (Signed)
Copied from CRM [id number]. Topic: Clinical - Medical Advice >> Aug 05, 2023  2:43 PM Isabell A wrote: Reason for CRM: Sarah from [facility], Dr.Michael Merz office states nothing else is needed before surgery, unless the patient has cardiac history - he will need an EKG.   Callback [phone]

## 2023-08-05 NOTE — Telephone Encounter (Signed)
Spoke to receptionist at [employer] @ [phone] they will reach out to Dr Milbert Coulter and see what else the pt needs for Preop before his total hip replacement  surgery they will contact us back if anything else is needed other than CMP,CBC,or Hemoglobin A1C

## 2023-08-05 NOTE — Telephone Encounter (Signed)
Spoke to pt he stated that that he only needs CBC,CMP, Hemoglobin A1C done for pre op , I then called Dr Milbert Coulter office to confirm waiting on someone to reach back out to [place] to be sure that is all he needs before surgery

## 2023-08-06 NOTE — Telephone Encounter (Signed)
noted

## 2023-08-07 ENCOUNTER — Telehealth: Payer: Self-pay | Admitting: Family

## 2023-08-07 NOTE — Telephone Encounter (Signed)
Form placed in provider folder awaiting preop appt

## 2023-08-07 NOTE — Telephone Encounter (Signed)
Pre Op form from [facility] is up front in Arnett's color folder. Also was sent to [address].

## 2023-08-11 ENCOUNTER — Telehealth: Payer: Self-pay

## 2023-08-11 DIAGNOSIS — I10 Essential (primary) hypertension: Secondary | ICD-10-CM

## 2023-08-11 DIAGNOSIS — I471 Supraventricular tachycardia, unspecified: Secondary | ICD-10-CM

## 2023-08-11 DIAGNOSIS — R899 Unspecified abnormal finding in specimens from other organs, systems and tissues: Secondary | ICD-10-CM

## 2023-08-11 DIAGNOSIS — D721 Eosinophilia, unspecified: Secondary | ICD-10-CM

## 2023-08-11 DIAGNOSIS — R7309 Other abnormal glucose: Secondary | ICD-10-CM

## 2023-08-11 DIAGNOSIS — Z01818 Encounter for other preprocedural examination: Secondary | ICD-10-CM

## 2023-08-11 NOTE — Telephone Encounter (Signed)
Per [facility] medical clearance form patient require most recent labs 05/28/23, my office note, EKG  03/17/23 and echocardiogram 03/27/21.  Please print and add to clearance form in my folder so I can complete

## 2023-08-11 NOTE — Telephone Encounter (Signed)
Copied from CRM [id number]. Topic: General - Other >> Aug 11, 2023  3:24 PM [place] A wrote: Reason for CRM: Patient asked if Claris Che could call him back regarding lab work he needs for surgery

## 2023-08-11 NOTE — Telephone Encounter (Signed)
Printed off and placed in folder with preop form

## 2023-08-12 NOTE — Addendum Note (Signed)
Addended by: [place], Hunner Garcon on: 08/12/2023 11:17 AM   Modules accepted: Orders

## 2023-08-12 NOTE — Telephone Encounter (Signed)
LVM to call back and schedule lab appt labs are ordered Please schedule when pt calls back

## 2023-08-12 NOTE — Telephone Encounter (Signed)
Pt has called back and scheduled lab appt for 08/18/23

## 2023-08-18 ENCOUNTER — Other Ambulatory Visit (INDEPENDENT_AMBULATORY_CARE_PROVIDER_SITE_OTHER): Payer: Medicare Other

## 2023-08-18 DIAGNOSIS — R7309 Other abnormal glucose: Secondary | ICD-10-CM

## 2023-08-18 DIAGNOSIS — R7303 Prediabetes: Secondary | ICD-10-CM

## 2023-08-18 DIAGNOSIS — R600 Localized edema: Secondary | ICD-10-CM | POA: Diagnosis not present

## 2023-08-18 DIAGNOSIS — Z01818 Encounter for other preprocedural examination: Secondary | ICD-10-CM

## 2023-08-18 DIAGNOSIS — I471 Supraventricular tachycardia, unspecified: Secondary | ICD-10-CM

## 2023-08-18 DIAGNOSIS — M25551 Pain in right hip: Secondary | ICD-10-CM | POA: Diagnosis not present

## 2023-08-18 DIAGNOSIS — I10 Essential (primary) hypertension: Secondary | ICD-10-CM

## 2023-08-18 LAB — COMPREHENSIVE METABOLIC PANEL
ALT: 18 U/L (ref 0–53)
AST: 18 U/L (ref 0–37)
Albumin: 4.3 g/dL (ref 3.5–5.2)
Alkaline Phosphatase: 62 U/L (ref 39–117)
BUN: 29 mg/dL — ABNORMAL HIGH (ref 6–23)
CO2: 29 meq/L (ref 19–32)
Calcium: 9.4 mg/dL (ref 8.4–10.5)
Chloride: 99 meq/L (ref 96–112)
Creatinine, Ser: 1.37 mg/dL (ref 0.40–1.50)
GFR: 51.83 mL/min — ABNORMAL LOW (ref >60.00)
Glucose, Bld: 104 mg/dL — ABNORMAL HIGH (ref 70–99)
Potassium: 4.3 meq/L (ref 3.5–5.1)
Sodium: 138 meq/L (ref 135–145)
Total Bilirubin: 0.7 mg/dL (ref 0.2–1.2)
Total Protein: 7.3 g/dL (ref 6.0–8.3)

## 2023-08-18 LAB — CBC WITH DIFFERENTIAL/PLATELET
Basophils Absolute: 0.1 10*3/uL (ref 0.0–0.1)
Basophils Relative: 0.7 % (ref 0.0–3.0)
Eosinophils Absolute: 0.4 10*3/uL (ref 0.0–0.7)
Eosinophils Relative: 4.6 % (ref 0.0–5.0)
HCT: 43.6 % (ref 39.0–52.0)
Hemoglobin: 14.6 g/dL (ref 13.0–17.0)
Lymphocytes Relative: 39.4 % (ref 12.0–46.0)
Lymphs Abs: 3.1 10*3/uL (ref 0.7–4.0)
MCHC: 33.6 g/dL (ref 30.0–36.0)
MCV: 97.6 fL (ref 78.0–100.0)
Monocytes Absolute: 0.8 10*3/uL (ref 0.1–1.0)
Monocytes Relative: 10.9 % (ref 3.0–12.0)
Neutro Abs: 3.5 10*3/uL (ref 1.4–7.7)
Neutrophils Relative %: 44.4 % (ref 43.0–77.0)
Platelets: 278 10*3/uL (ref 150.0–400.0)
RBC: 4.47 Mil/uL (ref 4.22–5.81)
RDW: 15.1 % (ref 11.5–15.5)
WBC: 7.8 10*3/uL (ref 4.0–10.5)

## 2023-08-18 LAB — PROTIME-INR
INR: 1.8 {ratio} — ABNORMAL HIGH (ref 0.8–1.0)
Prothrombin Time: 18.8 s — ABNORMAL HIGH (ref 9.6–13.1)

## 2023-08-18 LAB — TSH: TSH: 2.19 u[IU]/mL (ref 0.35–5.50)

## 2023-08-18 LAB — HEMOGLOBIN A1C: Hgb A1c MFr Bld: 6.1 % (ref 4.6–6.5)

## 2023-08-20 ENCOUNTER — Telehealth: Payer: Self-pay | Admitting: Cardiovascular Disease

## 2023-08-20 NOTE — Telephone Encounter (Signed)
 Follow Up:      Patient is calling to check on the status of his clearance. He says his surgery is 08-27-23.

## 2023-08-20 NOTE — Telephone Encounter (Signed)
 His Xarelto should be held for 3 days only.  His blood should not be thin at all after holding for 3 days.

## 2023-08-20 NOTE — Telephone Encounter (Signed)
 Returned call to pt, he has been made aware that his surgery clearance was already sent back 07/17/23.  He is concerned about his Xarelto and PT INR/ results, and his a-fib and just has some questions.  He already has a call into Dr. Jari Sportsman nurse.

## 2023-08-20 NOTE — Telephone Encounter (Signed)
 The patient called to report that he is scheduled to have hip surgery on 08/27/23. He stated that he was previously informed he could hold Xarelto for 3 days prior to the procedure, but his surgeon advised him to hold it for 5 days. The patient also reported concerns about his PT/INR levels and noted he experienced two episodes of Afib this week but was able to convert back to normal sinus rhythm  after taking PRN diltiazem. The patient is inquiring whether it is safe for him to stop the medication based on his recent lab results and recent AFib episodes.   Will forward for recommendations

## 2023-08-20 NOTE — Telephone Encounter (Signed)
   Patient Name: Dalton Sanders  DOB: [birth year] MRN: [id number]  Primary Cardiologist: Lorine Bears, MD  Clinical pharmacists and primary cardiologist have reviewed the patient's past medical history, labs, and current medications as part of preoperative protocol coverage. The following recommendations have been made:  Per Dr. Kirke Corin: "His Xarelto should be held for 3 days only.  His blood should not be thin at all after holding for 3 days."  I will route this recommendation to the requesting party via Epic fax function and remove from pre-op pool.  Please call with questions.  Denyce Robert, NP 08/20/2023, 2:23 PM

## 2023-08-20 NOTE — Telephone Encounter (Signed)
 New Message:     Patient says he would like for Dr Jari Sportsman nurse to please call him. He says it is about his lab results.

## 2023-08-21 ENCOUNTER — Encounter: Payer: Self-pay | Admitting: Family

## 2023-08-23 ENCOUNTER — Other Ambulatory Visit: Payer: Self-pay | Admitting: Family

## 2023-08-23 DIAGNOSIS — I10 Essential (primary) hypertension: Secondary | ICD-10-CM

## 2023-08-27 DIAGNOSIS — Z7901 Long term (current) use of anticoagulants: Secondary | ICD-10-CM | POA: Diagnosis not present

## 2023-08-27 DIAGNOSIS — S43491A Other sprain of right shoulder joint, initial encounter: Secondary | ICD-10-CM | POA: Diagnosis not present

## 2023-08-27 DIAGNOSIS — M1611 Unilateral primary osteoarthritis, right hip: Secondary | ICD-10-CM | POA: Diagnosis not present

## 2023-08-27 DIAGNOSIS — I480 Paroxysmal atrial fibrillation: Secondary | ICD-10-CM | POA: Diagnosis not present

## 2023-08-27 DIAGNOSIS — K219 Gastro-esophageal reflux disease without esophagitis: Secondary | ICD-10-CM | POA: Diagnosis not present

## 2023-08-27 DIAGNOSIS — M1652 Unilateral post-traumatic osteoarthritis, left hip: Secondary | ICD-10-CM | POA: Diagnosis not present

## 2023-08-27 DIAGNOSIS — I10 Essential (primary) hypertension: Secondary | ICD-10-CM | POA: Diagnosis not present

## 2023-08-27 DIAGNOSIS — M25751 Osteophyte, right hip: Secondary | ICD-10-CM | POA: Diagnosis not present

## 2023-08-27 HISTORY — PX: TOTAL HIP ARTHROPLASTY: SHX124

## 2023-08-28 DIAGNOSIS — I480 Paroxysmal atrial fibrillation: Secondary | ICD-10-CM | POA: Diagnosis not present

## 2023-08-28 DIAGNOSIS — I10 Essential (primary) hypertension: Secondary | ICD-10-CM | POA: Diagnosis not present

## 2023-08-28 DIAGNOSIS — M25751 Osteophyte, right hip: Secondary | ICD-10-CM | POA: Diagnosis not present

## 2023-08-28 DIAGNOSIS — K219 Gastro-esophageal reflux disease without esophagitis: Secondary | ICD-10-CM | POA: Diagnosis not present

## 2023-08-28 DIAGNOSIS — M1611 Unilateral primary osteoarthritis, right hip: Secondary | ICD-10-CM | POA: Diagnosis not present

## 2023-08-28 DIAGNOSIS — S43491A Other sprain of right shoulder joint, initial encounter: Secondary | ICD-10-CM | POA: Diagnosis not present

## 2023-08-31 DIAGNOSIS — R600 Localized edema: Secondary | ICD-10-CM | POA: Diagnosis not present

## 2023-08-31 DIAGNOSIS — M25551 Pain in right hip: Secondary | ICD-10-CM | POA: Diagnosis not present

## 2023-09-03 DIAGNOSIS — R600 Localized edema: Secondary | ICD-10-CM | POA: Diagnosis not present

## 2023-09-03 DIAGNOSIS — M25551 Pain in right hip: Secondary | ICD-10-CM | POA: Diagnosis not present

## 2023-09-07 DIAGNOSIS — R600 Localized edema: Secondary | ICD-10-CM | POA: Diagnosis not present

## 2023-09-07 DIAGNOSIS — M25551 Pain in right hip: Secondary | ICD-10-CM | POA: Diagnosis not present

## 2023-09-10 DIAGNOSIS — R600 Localized edema: Secondary | ICD-10-CM | POA: Diagnosis not present

## 2023-09-10 DIAGNOSIS — M25551 Pain in right hip: Secondary | ICD-10-CM | POA: Diagnosis not present

## 2023-09-14 DIAGNOSIS — R600 Localized edema: Secondary | ICD-10-CM | POA: Diagnosis not present

## 2023-09-14 DIAGNOSIS — M25551 Pain in right hip: Secondary | ICD-10-CM | POA: Diagnosis not present

## 2023-09-15 DIAGNOSIS — Z96641 Presence of right artificial hip joint: Secondary | ICD-10-CM | POA: Diagnosis not present

## 2023-09-16 ENCOUNTER — Ambulatory Visit: Payer: Medicare Other | Attending: Cardiovascular Disease | Admitting: Cardiovascular Disease

## 2023-09-16 ENCOUNTER — Encounter: Payer: Self-pay | Admitting: Cardiovascular Disease

## 2023-09-16 VITALS — BP 134/60 | HR 63 | Ht 66.0 in | Wt 180.4 lb

## 2023-09-16 DIAGNOSIS — I6523 Occlusion and stenosis of bilateral carotid arteries: Secondary | ICD-10-CM | POA: Insufficient documentation

## 2023-09-16 DIAGNOSIS — I480 Paroxysmal atrial fibrillation: Secondary | ICD-10-CM | POA: Diagnosis not present

## 2023-09-16 DIAGNOSIS — I2510 Atherosclerotic heart disease of native coronary artery without angina pectoris: Secondary | ICD-10-CM | POA: Insufficient documentation

## 2023-09-16 DIAGNOSIS — E785 Hyperlipidemia, unspecified: Secondary | ICD-10-CM | POA: Diagnosis not present

## 2023-09-16 DIAGNOSIS — I10 Essential (primary) hypertension: Secondary | ICD-10-CM | POA: Insufficient documentation
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
## 2023-09-16 NOTE — Patient Instructions (Signed)
 Medication Instructions:  No changes *If you need a refill on your cardiac medications before your next appointment, please call your pharmacy*   Lab Work: None ordered If you have labs (blood work) drawn today and your tests are completely normal, you will receive your results only by: MyChart Message (if you have MyChart) OR A paper copy in the mail If you have any lab test that is abnormal or we need to change your treatment, we will call you to review the results.   Testing/Procedures: Your physician has requested that you have an echocardiogram. Echocardiography is a painless test that uses sound waves to create images of your heart. It provides your doctor with information about the size and shape of your heart and how well your heart's chambers and valves are working.   You may receive an ultrasound enhancing agent through an IV if needed to better visualize your heart during the echo. This procedure takes approximately one hour.  There are no restrictions for this procedure.  This will take place at [address] [facility]) [address], [place] [address]  Please note: We ask at that you not bring children with you during ultrasound (echo/ vascular) testing. Due to room size and safety concerns, children are not allowed in the ultrasound rooms during exams. Our front office staff cannot provide observation of children in our lobby area while testing is being conducted. An adult accompanying a patient to their appointment will only be allowed in the ultrasound room at the discretion of the ultrasound technician under special circumstances. We apologize for any inconvenience.    Follow-Up: At [facility], you and your health needs are our priority.  As part of our continuing mission to provide you with exceptional heart care, we have created designated Provider Care Teams.  These Care Teams include your primary Cardiologist (physician) and Advanced Practice  Providers (APPs -  Physician Assistants and Nurse Practitioners) who all work together to provide you with the care you need, when you need it.  We recommend signing up for the patient portal called "MyChart".  Sign up information is provided on this After Visit Summary.  MyChart is used to connect with patients for Virtual Visits (Telemedicine).  Patients are able to view lab/test results, encounter notes, upcoming appointments, etc.  Non-urgent messages can be sent to your provider as well.   To learn more about what you can do with MyChart, go to [email or web address].    Your next appointment:   6 month(s)  Provider:   You may see Lorine Bears, MD or one of the following Advanced Practice Providers on your designated Care Team:   Nicolasa Ducking, NP Eula Listen, PA-C Cadence Fransico Michael, PA-C Charlsie Quest, NP Carlos Levering, NP    Other Instructions A referral to Electrophysiology has been placed

## 2023-10-03 ENCOUNTER — Other Ambulatory Visit: Payer: Self-pay | Admitting: Family

## 2023-10-03 DIAGNOSIS — I10 Essential (primary) hypertension: Secondary | ICD-10-CM

## 2023-10-12 ENCOUNTER — Ambulatory Visit

## 2023-10-12 ENCOUNTER — Other Ambulatory Visit: Payer: Self-pay | Admitting: Cardiovascular Disease

## 2023-10-12 ENCOUNTER — Other Ambulatory Visit: Payer: Self-pay | Admitting: Family

## 2023-10-12 DIAGNOSIS — E785 Hyperlipidemia, unspecified: Secondary | ICD-10-CM

## 2023-10-12 DIAGNOSIS — I10 Essential (primary) hypertension: Secondary | ICD-10-CM

## 2023-10-21 ENCOUNTER — Institutional Professional Consult (permissible substitution): Admitting: Cardiology

## 2023-11-06 ENCOUNTER — Ambulatory Visit: Attending: Cardiology

## 2023-11-06 DIAGNOSIS — I480 Paroxysmal atrial fibrillation: Secondary | ICD-10-CM | POA: Insufficient documentation

## 2023-11-06 LAB — ECHOCARDIOGRAM COMPLETE
AR max vel: 2.23 cm2
AV Area VTI: 2.29 cm2
AV Area mean vel: 2.21 cm2
AV Mean grad: 4 mmHg
AV Peak grad: 8.3 mmHg
Ao pk vel: 1.44 m/s
Area-P 1/2: 3.03 cm2
Calc EF: 57.4 %
S' Lateral: 3.33 cm
Single Plane A2C EF: 53.3 %
Single Plane A4C EF: 63.7 %

## 2023-11-09 ENCOUNTER — Ambulatory Visit: Payer: Self-pay | Admitting: Cardiovascular Disease

## 2023-11-10 NOTE — Progress Notes (Signed)
 Electrophysiology Office Note:    Date:  11/11/2023   ID:  Dalton Sanders, DOB November 30, 1950, MRN [id number]  [facility] Cardiologist:  Antionette Kirks, MD  [facility] Electrophysiologist:  Boyce Byes, MD   Referring MD: Wenona Hamilton, MD   Chief Complaint: Atrial fibrillation  History of Present Illness:    Dalton Sanders is a 73 year old man who I am seeing today for an evaluation of atrial fibrillation at the request of Dr. Alvenia Aus.  The patient last saw Dr. Alvenia Aus September 16, 2023.  The patient has a history of hyperlipidemia, nonobstructive carotid disease and hypertension.  His atrial fibrillation was diagnosed in 2018.  He was started on Xarelto .  He had recurrent palpitations and then was started on flecainide .  He has been hospitalized multiple times with chest pain.  Dobutamine  stress echo at [facility] last year did not show evidence of obstructive coronary artery disease.  He rides motorcycles and horses and is concerned about the risks of bleeding.  He continues to have symptomatic breakthrough episodes of atrial fibrillation despite treatment with flecainide .  At the last appointment with Dr. Alvenia Aus, catheter ablation and left atrial appendage occlusion were discussed.  He builds custom motorcycles. He is very active.  He has read up on atrial fibrillation ablation and is spoken to several people who have had the procedure.  He has rare episodes of atrial fibrillation but they are symptomatic.     Their past medical, social and family history was reviewed.   ROS:   Please see the history of present illness.    All other systems reviewed and are negative.  EKGs/Labs/Other Studies Reviewed:    The following studies were reviewed today:  Nov 06, 2023 echo EF 60% RV normal Trivial MR   September 16, 2023 EKG shows sinus rhythm.  PR 240 milliseconds, QRS duration 90 ms.  EKG Interpretation Date/Time:  Wednesday Nov 11 2023 10:29:40 EDT Ventricular Rate:  65 PR  Interval:  226 QRS Duration:  86 QT Interval:  542 QTC Calculation: 563 R Axis:   -29  Text Interpretation: Sinus rhythm with 1st degree A-V block PR interval has increased Confirmed by Harvie Liner [phone]) on 11/11/2023 10:42:36 AM    Physical Exam:    VS:  BP (!) 146/74   Pulse 65   Ht 5\' 6"  (1.676 m)   Wt 183 lb (83 kg)   SpO2 98%   BMI 29.54 kg/m     Wt Readings from Last 3 Encounters:  11/11/23 183 lb (83 kg)  09/16/23 180 lb 6.4 oz (81.8 kg)  05/13/23 179 lb (81.2 kg)     GEN: no distress.  Appears younger than stated age. CARD: RRR, No MRG RESP: No IWOB. CTAB.        ASSESSMENT AND PLAN:    1. Paroxysmal atrial fibrillation (HCC)   2. Essential hypertension   3. Encounter for long-term (current) use of high-risk medication     #Paroxysmal atrial fibrillation #High risk med monitoring-flecainide  The patient continues to have symptomatic breakthrough episodes of atrial fibrillation despite treatment with flecainide .  His PR and QRS duration is acceptable for ongoing flecainide  use. Continue Xarelto  for stroke prophylaxis.  I discussed treatment options for his atrial fibrillation during today's clinic appointment including conservative management, antiarrhythmic drugs and catheter ablation.  ---------------  Discussed treatment options today for AF including antiarrhythmic drug therapy and ablation. Discussed risks, recovery and likelihood of success with each treatment strategy. Risk, benefits, and alternatives to EP  study and ablation for afib were discussed. These risks include but are not limited to stroke, bleeding, vascular damage, tamponade, perforation, damage to the esophagus, lungs, phrenic nerve and other structures, pulmonary vein stenosis, worsening renal function, coronary vasospasm and death.  Discussed potential need for repeat ablation procedures and antiarrhythmic drugs after an initial ablation. The patient understands these risks.Aaron Aas  He  would think about his options and let us  know when he would like to proceed.  Carto, ICE, anesthesia are requested for the procedure.  Will also obtain CT PV protocol prior to the procedure to exclude LAA thrombus and further evaluate atrial anatomy.  HAS-BLED score 2 Hypertension Yes  Abnormal renal and liver function (Dialysis, transplant, Cr >2.26 mg/dL /Cirrhosis or Bilirubin >2x Normal or AST/ALT/AP >3x Normal) No  Stroke No  Bleeding No  Labile INR (Unstable/high INR) No  Elderly (>65) Yes  Drugs or alcohol (>= 8 drinks/week, anti-plt or NSAID) No   CHA2DS2-VASc Score = 3  The patient's score is based upon: CHF History: 0 HTN History: 1 Diabetes History: 0 Stroke History: 0 Vascular Disease History: 1 Age Score: 1 Gender Score: 0       Signed, Dondrea Clendenin T. Marven Slimmer, MD, [facility], [facility] 11/11/2023 11:07 AM    Electrophysiology [facility]

## 2023-11-11 ENCOUNTER — Ambulatory Visit: Attending: Cardiology | Admitting: Cardiology

## 2023-11-11 ENCOUNTER — Encounter: Payer: Self-pay | Admitting: Cardiology

## 2023-11-11 VITALS — BP 146/74 | HR 65 | Ht 66.0 in | Wt 183.0 lb

## 2023-11-11 DIAGNOSIS — I480 Paroxysmal atrial fibrillation: Secondary | ICD-10-CM | POA: Insufficient documentation

## 2023-11-11 DIAGNOSIS — I10 Essential (primary) hypertension: Secondary | ICD-10-CM | POA: Diagnosis not present

## 2023-11-11 DIAGNOSIS — Z79899 Other long term (current) drug therapy: Secondary | ICD-10-CM | POA: Diagnosis not present

## 2023-11-11 NOTE — Patient Instructions (Addendum)
 Medication Instructions:  Your physician recommends that you continue on your current medications as directed. Please refer to the Current Medication list given to you today.  *If you need a refill on your cardiac medications before your next appointment, please call your pharmacy*  Testing/Procedures: Ablation - please call us  if you decide that you would like to schedule.  Your physician has recommended that you have an ablation. Catheter ablation is a medical procedure used to treat some cardiac arrhythmias (irregular heartbeats). During catheter ablation, a long, thin, flexible tube is put into a blood vessel in your groin (upper thigh), or neck. This tube is called an ablation catheter. It is then guided to your heart through the blood vessel. Radio frequency waves destroy small areas of heart tissue where abnormal heartbeats may cause an arrhythmia to start. Please see the instruction sheet given to you today.  Follow-Up: At [facility], you and your health needs are our priority.  As part of our continuing mission to provide you with exceptional heart care, we have created designated Provider Care Teams.  These Care Teams include your primary Cardiologist (physician) and Advanced Practice Providers (APPs -  Physician Assistants and Nurse Practitioners) who all work together to provide you with the care you need, when you need it.

## 2023-11-26 ENCOUNTER — Telehealth: Payer: Self-pay

## 2023-11-26 MED ORDER — OMEPRAZOLE 20 MG PO CPDR: 20.0000 mg | DELAYED_RELEASE_CAPSULE | Freq: Every day | ORAL | 3 refills | Status: DC | PRN

## 2023-11-26 NOTE — Telephone Encounter (Signed)
 Spoke to pt he needs to know if you can send in NEW [facility] [place]

## 2023-11-26 NOTE — Telephone Encounter (Signed)
 Copied from CRM [id number]. Topic: General - Other >> Nov 26, 2023  8:59 AM Jenice Mitts wrote: Reason for CRM: Patient is calling he would like a call to speak about an expired medication that was prescribed by his GI doctor  Can be reached at Mobile number

## 2023-11-26 NOTE — Addendum Note (Signed)
 Addended by: Idaliz Tinkle G on: 11/26/2023 04:54 PM   Modules accepted: Orders

## 2023-12-16 ENCOUNTER — Other Ambulatory Visit: Payer: Self-pay | Admitting: Cardiovascular Disease

## 2023-12-16 ENCOUNTER — Other Ambulatory Visit: Payer: Self-pay | Admitting: Family

## 2023-12-16 DIAGNOSIS — F411 Generalized anxiety disorder: Secondary | ICD-10-CM

## 2023-12-23 ENCOUNTER — Other Ambulatory Visit: Payer: Self-pay | Admitting: Family

## 2023-12-23 DIAGNOSIS — I10 Essential (primary) hypertension: Secondary | ICD-10-CM

## 2024-03-05 ENCOUNTER — Other Ambulatory Visit: Payer: Self-pay | Admitting: Cardiovascular Disease

## 2024-03-05 DIAGNOSIS — I480 Paroxysmal atrial fibrillation: Secondary | ICD-10-CM

## 2024-03-10 DIAGNOSIS — L308 Other specified dermatitis: Secondary | ICD-10-CM | POA: Diagnosis not present

## 2024-03-10 DIAGNOSIS — D225 Melanocytic nevi of trunk: Secondary | ICD-10-CM | POA: Diagnosis not present

## 2024-03-10 DIAGNOSIS — L570 Actinic keratosis: Secondary | ICD-10-CM | POA: Diagnosis not present

## 2024-03-10 DIAGNOSIS — D2261 Melanocytic nevi of right upper limb, including shoulder: Secondary | ICD-10-CM | POA: Diagnosis not present

## 2024-03-10 DIAGNOSIS — D2272 Melanocytic nevi of left lower limb, including hip: Secondary | ICD-10-CM | POA: Diagnosis not present

## 2024-03-10 DIAGNOSIS — D2262 Melanocytic nevi of left upper limb, including shoulder: Secondary | ICD-10-CM | POA: Diagnosis not present

## 2024-03-10 DIAGNOSIS — D2271 Melanocytic nevi of right lower limb, including hip: Secondary | ICD-10-CM | POA: Diagnosis not present

## 2024-03-10 DIAGNOSIS — L821 Other seborrheic keratosis: Secondary | ICD-10-CM | POA: Diagnosis not present

## 2024-03-16 ENCOUNTER — Other Ambulatory Visit: Payer: Self-pay | Admitting: Cardiovascular Disease

## 2024-03-16 ENCOUNTER — Other Ambulatory Visit: Payer: Self-pay | Admitting: Family

## 2024-03-16 DIAGNOSIS — E785 Hyperlipidemia, unspecified: Secondary | ICD-10-CM

## 2024-03-20 ENCOUNTER — Other Ambulatory Visit: Payer: Self-pay | Admitting: Family

## 2024-03-22 ENCOUNTER — Telehealth: Payer: Self-pay

## 2024-03-22 ENCOUNTER — Other Ambulatory Visit: Payer: Self-pay

## 2024-03-22 DIAGNOSIS — E785 Hyperlipidemia, unspecified: Secondary | ICD-10-CM

## 2024-03-22 MED ORDER — ATORVASTATIN CALCIUM 80 MG PO TABS: 80.0000 mg | ORAL_TABLET | Freq: Every day | ORAL | 0 refills | Status: DC

## 2024-03-22 NOTE — Addendum Note (Signed)
 Addended by: Sabra Sessler on: 03/22/2024 04:20 PM   Modules accepted: Orders

## 2024-03-22 NOTE — Telephone Encounter (Signed)
 Copied from CRM [id number]. Topic: Clinical - Medication Question >> Mar 22, 2024 10:19 AM [place] J wrote: Reason for CRM: Patient is scheduled for an office visit Nov. 3rd and was wondering if Rollene would be okay with sending in enough atorvastatin  to get him through till his appointment.

## 2024-03-22 NOTE — Addendum Note (Signed)
 Addended by: Denyla Cortese on: 03/22/2024 04:17 PM   Modules accepted: Orders

## 2024-03-31 ENCOUNTER — Other Ambulatory Visit: Payer: Self-pay | Admitting: Family

## 2024-03-31 DIAGNOSIS — E785 Hyperlipidemia, unspecified: Secondary | ICD-10-CM

## 2024-04-20 ENCOUNTER — Other Ambulatory Visit: Payer: Self-pay | Admitting: Family

## 2024-04-20 DIAGNOSIS — I10 Essential (primary) hypertension: Secondary | ICD-10-CM

## 2024-04-25 ENCOUNTER — Ambulatory Visit (INDEPENDENT_AMBULATORY_CARE_PROVIDER_SITE_OTHER): Admitting: Family

## 2024-04-25 ENCOUNTER — Encounter: Payer: Self-pay | Admitting: Family

## 2024-04-25 VITALS — BP 136/80 | HR 65 | Temp 97.5°F | Ht 66.0 in | Wt 177.8 lb

## 2024-04-25 DIAGNOSIS — E785 Hyperlipidemia, unspecified: Secondary | ICD-10-CM

## 2024-04-25 DIAGNOSIS — I10 Essential (primary) hypertension: Secondary | ICD-10-CM | POA: Diagnosis not present

## 2024-04-25 DIAGNOSIS — D509 Iron deficiency anemia, unspecified: Secondary | ICD-10-CM | POA: Diagnosis not present

## 2024-04-25 DIAGNOSIS — I471 Supraventricular tachycardia, unspecified: Secondary | ICD-10-CM | POA: Diagnosis not present

## 2024-04-25 DIAGNOSIS — Z01818 Encounter for other preprocedural examination: Secondary | ICD-10-CM | POA: Diagnosis not present

## 2024-04-25 DIAGNOSIS — R7303 Prediabetes: Secondary | ICD-10-CM

## 2024-04-25 DIAGNOSIS — Z136 Encounter for screening for cardiovascular disorders: Secondary | ICD-10-CM

## 2024-04-25 DIAGNOSIS — Z125 Encounter for screening for malignant neoplasm of prostate: Secondary | ICD-10-CM

## 2024-04-25 DIAGNOSIS — R7309 Other abnormal glucose: Secondary | ICD-10-CM | POA: Diagnosis not present

## 2024-04-25 DIAGNOSIS — Z13220 Encounter for screening for lipoid disorders: Secondary | ICD-10-CM

## 2024-04-25 DIAGNOSIS — R319 Hematuria, unspecified: Secondary | ICD-10-CM | POA: Diagnosis not present

## 2024-04-25 LAB — COMPREHENSIVE METABOLIC PANEL WITH GFR
ALT: 17 U/L (ref 0–53)
AST: 19 U/L (ref 0–37)
Albumin: 4.4 g/dL (ref 3.5–5.2)
Alkaline Phosphatase: 86 U/L (ref 39–117)
BUN: 13 mg/dL (ref 6–23)
CO2: 28 meq/L (ref 19–32)
Calcium: 9.4 mg/dL (ref 8.4–10.5)
Chloride: 102 meq/L (ref 96–112)
Creatinine, Ser: 1.25 mg/dL (ref 0.40–1.50)
GFR: 57.58 mL/min — ABNORMAL LOW (ref >60.00)
Glucose, Bld: 82 mg/dL (ref 70–99)
Potassium: 4.7 meq/L (ref 3.5–5.1)
Sodium: 141 meq/L (ref 135–145)
Total Bilirubin: 0.6 mg/dL (ref 0.2–1.2)
Total Protein: 7.6 g/dL (ref 6.0–8.3)

## 2024-04-25 LAB — LIPID PANEL
Cholesterol: 168 mg/dL (ref 0–200)
HDL: 51.3 mg/dL (ref >39.00)
LDL Cholesterol: 78 mg/dL (ref 0–99)
NonHDL: 116.58
Total CHOL/HDL Ratio: 3
Triglycerides: 193 mg/dL — ABNORMAL HIGH (ref 0.0–149.0)
VLDL: 38.6 mg/dL (ref 0.0–40.0)

## 2024-04-25 LAB — URINALYSIS, ROUTINE W REFLEX MICROSCOPIC
Bilirubin Urine: NEGATIVE
Ketones, ur: NEGATIVE
Leukocytes,Ua: NEGATIVE
Nitrite: NEGATIVE
RBC / HPF: NONE SEEN (ref 0–?)
Specific Gravity, Urine: <=1.005 — AB (ref 1.000–1.030)
Total Protein, Urine: NEGATIVE
Urine Glucose: NEGATIVE
Urobilinogen, UA: 0.2 (ref 0.0–1.0)
pH: 7 (ref 5.0–8.0)

## 2024-04-25 LAB — PSA: PSA: 3.91 ng/mL (ref 0.10–4.00)

## 2024-04-25 LAB — HEMOGLOBIN A1C: Hgb A1c MFr Bld: 6.3 % (ref 4.6–6.5)

## 2024-04-25 LAB — TSH: TSH: 1.28 u[IU]/mL (ref 0.35–5.50)

## 2024-04-25 NOTE — Progress Notes (Signed)
 Assessment & Plan:  Prediabetes -     Hemoglobin A1c  Screening for prostate cancer -     PSA  Hematuria, unspecified type -     PSA -     Urinalysis, Routine w reflex microscopic  Primary hypertension Assessment & Plan: Chronic, stable .  He is no longer on hydrochlorothiazide .  Will follow  Orders: -     TSH  Elevated glucose -     Hemoglobin A1c  Pre-op exam -     Comprehensive metabolic panel with GFR  Iron deficiency anemia, unspecified iron deficiency anemia type  SVT (supraventricular tachycardia)  Encounter for lipid screening for cardiovascular disease -     Lipid panel  Hyperlipidemia, unspecified hyperlipidemia type Assessment & Plan: Previously stable.  Pending lipid panel.  Continue atorvastatin  80 mg qd      Return precautions given.   Risks, benefits, and alternatives of the medications and treatment plan prescribed today were discussed, and patient expressed understanding.   Education regarding symptom management and diagnosis given to patient on AVS either electronically or printed.  Return in about 1 year (around 04/25/2025).  Rollene Northern, FNP  Subjective:    Patient ID: Dalton Sanders, male    DOB: [birth year], 73 y.o.   MRN: [id number]  CC: Dalton Sanders is a 73 y.o. male who presents today for follow up.   HPI: Feels well today No new complaints BP at home 117/80.  He is staying active and enjoying riding motorcycles.  He is not longer on hydrochlorothiazide  or potassium  Denies palpitations, CP, sob  He missed follow-up with urology this year.  He would like PSA and urine rechecked.  Denies difficulty urine mating, decreased urine stream, gross hematuria    Follow-up cardiology, Dr Cindie 11/11/2023 for paroxysmal atrial fibrillation, hypertension.  Continue on flecainide , Xarelto   S/p right total hip replacement  Previously followed with Dr Francisca for screening PSA, hematuria   Allergies: Corticosteroids,  Iodides, Other, Oysters [shellfish allergy], and Cortisone Current Outpatient Medications on File Prior to Visit  Medication Sig Dispense Refill   atorvastatin  (LIPITOR) 80 MG tablet TAKE 1 TABLET BY MOUTH EVERY DAY 30 tablet 0   Cholecalciferol  (VITAMIN D3) 125 MCG (5000 UT) CAPS Take 1 capsule by mouth daily.     Coenzyme Q10 (CO Q 10 PO) Take 1 tablet by mouth daily.     diltiazem  (CARDIZEM  CD) 240 MG 24 hr capsule TAKE 1 CAPSULE BY MOUTH EVERY DAY (MD NOTED TO FILL FOR 4 DAY SUPPLY FIRST) 90 capsule 2   diltiazem  (CARDIZEM ) 30 MG tablet TAKE 1 TABLET (30 MG TOTAL) BY MOUTH AS NEEDED 90 tablet 1   flecainide  (TAMBOCOR ) 50 MG tablet TAKE 1 TABLET BY MOUTH TWICE A DAY 180 tablet 1   levalbuterol  (XOPENEX  HFA) 45 MCG/ACT inhaler Inhale 2 puffs into the lungs 3 (three) times daily. 1 each 12   MAGNESIUM PO Take 1 capsule by mouth daily.     Multiple Vitamin (ONE-A-DAY MENS PO) Take 1 tablet by mouth daily.      omeprazole  (PRILOSEC) 20 MG capsule TAKE 1 CAPSULE BY MOUTH DAILY AS NEEDED. 30 capsule 1   pantoprazole  (PROTONIX ) 40 MG tablet Take 40 mg by mouth daily.     Saw Palmetto, Serenoa repens, (SAW PALMETTO PO) Take 40 mg by mouth daily.      triamcinolone (KENALOG) 0.1 % Apply 1 application topically 2 (two) times daily as needed.     VITAMIN E PO Take  1 capsule by mouth.     XARELTO  20 MG TABS tablet TAKE 1 TABLET BY MOUTH DAILY WITH SUPPER 90 tablet 1   No current facility-administered medications on file prior to visit.    Review of Systems  Constitutional:  Negative for chills and fever.  Respiratory:  Negative for cough.   Cardiovascular:  Negative for chest pain and palpitations.  Gastrointestinal:  Negative for nausea and vomiting.  Genitourinary:  Negative for difficulty urinating and hematuria.      Objective:    BP 136/80   Pulse 65   Temp (!) 97.5 F (36.4 C) (Oral)   Ht 5' 6 (1.676 m)   Wt 177 lb 12.8 oz (80.6 kg)   SpO2 97%   BMI 28.70 kg/m  BP Readings from  Last 3 Encounters:  04/25/24 136/80  11/11/23 (!) 146/74  09/16/23 134/60   Wt Readings from Last 3 Encounters:  04/25/24 177 lb 12.8 oz (80.6 kg)  11/11/23 183 lb (83 kg)  09/16/23 180 lb 6.4 oz (81.8 kg)    Physical Exam Vitals reviewed.  Constitutional:      Appearance: He is well-developed.  Cardiovascular:     Rate and Rhythm: Regular rhythm.     Heart sounds: Normal heart sounds.  Pulmonary:     Effort: Pulmonary effort is normal. No respiratory distress.     Breath sounds: Normal breath sounds. No wheezing, rhonchi or rales.  Skin:    General: Skin is warm and dry.  Neurological:     Mental Status: He is alert.  Psychiatric:        Speech: Speech normal.        Behavior: Behavior normal.

## 2024-04-25 NOTE — Patient Instructions (Signed)
 Please call Dr Renato to schedule follow up.

## 2024-04-25 NOTE — Assessment & Plan Note (Signed)
 Previously stable.  Pending lipid panel.  Continue atorvastatin  80 mg qd

## 2024-04-25 NOTE — Assessment & Plan Note (Addendum)
 Chronic, stable .  He is no longer on hydrochlorothiazide .  Will follow

## 2024-05-02 ENCOUNTER — Other Ambulatory Visit: Payer: Self-pay | Admitting: Family

## 2024-05-02 DIAGNOSIS — E785 Hyperlipidemia, unspecified: Secondary | ICD-10-CM

## 2024-05-03 ENCOUNTER — Ambulatory Visit: Payer: Self-pay | Admitting: Family

## 2024-05-13 ENCOUNTER — Ambulatory Visit: Payer: Medicare Other

## 2024-05-20 ENCOUNTER — Other Ambulatory Visit: Payer: Self-pay | Admitting: Family

## 2024-06-07 ENCOUNTER — Other Ambulatory Visit: Payer: Self-pay | Admitting: Family

## 2024-06-07 DIAGNOSIS — E785 Hyperlipidemia, unspecified: Secondary | ICD-10-CM

## 2024-06-14 ENCOUNTER — Encounter: Payer: Self-pay | Admitting: Cardiovascular Disease

## 2024-06-14 ENCOUNTER — Ambulatory Visit: Attending: Cardiovascular Disease | Admitting: Cardiovascular Disease

## 2024-06-14 VITALS — BP 150/80 | HR 76 | Ht 66.0 in | Wt 179.5 lb

## 2024-06-14 DIAGNOSIS — I10 Essential (primary) hypertension: Secondary | ICD-10-CM | POA: Insufficient documentation

## 2024-06-14 DIAGNOSIS — I480 Paroxysmal atrial fibrillation: Secondary | ICD-10-CM | POA: Diagnosis present

## 2024-06-14 DIAGNOSIS — I6523 Occlusion and stenosis of bilateral carotid arteries: Secondary | ICD-10-CM | POA: Diagnosis present

## 2024-06-14 DIAGNOSIS — E785 Hyperlipidemia, unspecified: Secondary | ICD-10-CM | POA: Insufficient documentation
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
## 2024-06-14 NOTE — Patient Instructions (Signed)

## 2024-07-12 ENCOUNTER — Telehealth: Payer: Self-pay | Admitting: *Deleted

## 2024-07-12 DIAGNOSIS — E785 Hyperlipidemia, unspecified: Secondary | ICD-10-CM

## 2024-07-12 MED ORDER — ATORVASTATIN CALCIUM 80 MG PO TABS: 80.0000 mg | ORAL_TABLET | Freq: Every day | ORAL | 0 refills | Status: AC

## 2024-07-12 NOTE — Telephone Encounter (Unsigned)
 Copied from CRM #[id number]. Topic: Clinical - Medication Refill >> Jul 12, 2024  3:05 PM Macario HERO wrote: Medication: atorvastatin  (LIPITOR) 80 MG tablet [[id number]]  Has the patient contacted their pharmacy? No (Agent: If no, request that the patient contact the pharmacy for the refill. If patient does not wish to contact the pharmacy document the reason why and proceed with request.) (Agent: If yes, when and what did the pharmacy advise?)  This is the patient's preferred pharmacy:  [phone] [address] [place] - [address] [place] [address] Phone: [phone] Fax: [phone]   Is this the correct pharmacy for this prescription? Yes If no, delete pharmacy and type the correct one.   Has the prescription been filled recently? Yes  Is the patient out of the medication? Yes  Has the patient been seen for an appointment in the last year OR does the patient have an upcoming appointment? Yes  Can we respond through MyChart? No  Agent: Please be advised that Rx refills may take up to 3 business days. We ask that you follow-up with your pharmacy.

## 2024-07-12 NOTE — Telephone Encounter (Signed)
 Spoke to pt informed him that rx has been sent in pharmacy  called pharmacy to make sure they received it Prentice confirmed they did  receive it

## 2024-07-12 NOTE — Telephone Encounter (Signed)
 Copied from CRM #[id number]. Topic: Clinical - Medication Refill >> Jul 12, 2024  3:05 PM Macario HERO wrote: Medication: atorvastatin  (LIPITOR) 80 MG tablet [[id number]]  Has the patient contacted their pharmacy? No (Agent: If no, request that the patient contact the pharmacy for the refill. If patient does not wish to contact the pharmacy document the reason why and proceed with request.) (Agent: If yes, when and what did the pharmacy advise?)  This is the patient's preferred pharmacy:  [phone] [address], [place] - [address] [place] [address] Phone: [phone] Fax: [phone]   Is this the correct pharmacy for this prescription? Yes If no, delete pharmacy and type the correct one.   Has the prescription been filled recently? Yes  Is the patient out of the medication? Yes  Has the patient been seen for an appointment in the last year OR does the patient have an upcoming appointment? Yes  Can we respond through MyChart? No  Agent: Please be advised that Rx refills may take up to 3 business days. We ask that you follow-up with your pharmacy.

## 2024-07-13 ENCOUNTER — Ambulatory Visit

## 2024-09-20 ENCOUNTER — Ambulatory Visit
